# Patient Record
Sex: Male | Born: 1970 | Race: Black or African American | Hispanic: No | Marital: Single | Smoking: Former smoker
Health system: Southern US, Community
[De-identification: ages and names within clinical notes are randomized; demographics above are authoritative.]

## PROBLEM LIST (undated history)

## (undated) DIAGNOSIS — Z720 Tobacco use: Secondary | ICD-10-CM

## (undated) DIAGNOSIS — I429 Cardiomyopathy, unspecified: Secondary | ICD-10-CM

## (undated) DIAGNOSIS — N200 Calculus of kidney: Secondary | ICD-10-CM

## (undated) DIAGNOSIS — I729 Aneurysm of unspecified site: Secondary | ICD-10-CM

## (undated) DIAGNOSIS — I10 Essential (primary) hypertension: Secondary | ICD-10-CM

## (undated) DIAGNOSIS — J869 Pyothorax without fistula: Secondary | ICD-10-CM

## (undated) HISTORY — DX: Pyothorax without fistula: J86.9

## (undated) HISTORY — PX: ANEURYSM COILING: SHX5349

---

## 2007-12-23 ENCOUNTER — Emergency Department: Payer: Self-pay | Admitting: Emergency Medicine

## 2008-06-02 ENCOUNTER — Emergency Department: Payer: Self-pay | Admitting: Emergency Medicine

## 2008-06-02 ENCOUNTER — Other Ambulatory Visit: Payer: Self-pay

## 2008-08-15 ENCOUNTER — Emergency Department: Payer: Self-pay | Admitting: Emergency Medicine

## 2018-08-14 DIAGNOSIS — J869 Pyothorax without fistula: Secondary | ICD-10-CM

## 2018-08-14 HISTORY — DX: Pyothorax without fistula: J86.9

## 2018-08-16 ENCOUNTER — Emergency Department: Payer: Self-pay

## 2018-08-16 ENCOUNTER — Other Ambulatory Visit: Payer: Self-pay

## 2018-08-16 ENCOUNTER — Inpatient Hospital Stay
Admission: EM | Admit: 2018-08-16 | Discharge: 2018-08-19 | DRG: 193 | Disposition: A | Discharge status: Home / Self Care | Payer: MEDICAID | Attending: Internal Medicine | Admitting: Internal Medicine

## 2018-08-16 DIAGNOSIS — T380X5A Adverse effect of glucocorticoids and synthetic analogues, initial encounter: Secondary | ICD-10-CM | POA: Diagnosis present

## 2018-08-16 DIAGNOSIS — Z801 Family history of malignant neoplasm of trachea, bronchus and lung: Secondary | ICD-10-CM

## 2018-08-16 DIAGNOSIS — J189 Pneumonia, unspecified organism: Principal | ICD-10-CM | POA: Diagnosis present

## 2018-08-16 DIAGNOSIS — J918 Pleural effusion in other conditions classified elsewhere: Secondary | ICD-10-CM | POA: Diagnosis present

## 2018-08-16 DIAGNOSIS — Z716 Tobacco abuse counseling: Secondary | ICD-10-CM

## 2018-08-16 DIAGNOSIS — C7802 Secondary malignant neoplasm of left lung: Secondary | ICD-10-CM | POA: Diagnosis present

## 2018-08-16 DIAGNOSIS — J9601 Acute respiratory failure with hypoxia: Secondary | ICD-10-CM | POA: Diagnosis present

## 2018-08-16 DIAGNOSIS — I43 Cardiomyopathy in diseases classified elsewhere: Secondary | ICD-10-CM | POA: Diagnosis present

## 2018-08-16 DIAGNOSIS — F17210 Nicotine dependence, cigarettes, uncomplicated: Secondary | ICD-10-CM | POA: Diagnosis present

## 2018-08-16 DIAGNOSIS — Z808 Family history of malignant neoplasm of other organs or systems: Secondary | ICD-10-CM

## 2018-08-16 DIAGNOSIS — D7589 Other specified diseases of blood and blood-forming organs: Secondary | ICD-10-CM | POA: Diagnosis present

## 2018-08-16 DIAGNOSIS — J9811 Atelectasis: Secondary | ICD-10-CM | POA: Diagnosis present

## 2018-08-16 DIAGNOSIS — Z87442 Personal history of urinary calculi: Secondary | ICD-10-CM

## 2018-08-16 DIAGNOSIS — Z79899 Other long term (current) drug therapy: Secondary | ICD-10-CM

## 2018-08-16 DIAGNOSIS — Z806 Family history of leukemia: Secondary | ICD-10-CM

## 2018-08-16 DIAGNOSIS — X58XXXA Exposure to other specified factors, initial encounter: Secondary | ICD-10-CM | POA: Diagnosis present

## 2018-08-16 DIAGNOSIS — J410 Simple chronic bronchitis: Secondary | ICD-10-CM | POA: Diagnosis present

## 2018-08-16 DIAGNOSIS — R739 Hyperglycemia, unspecified: Secondary | ICD-10-CM | POA: Diagnosis not present

## 2018-08-16 DIAGNOSIS — J90 Pleural effusion, not elsewhere classified: Secondary | ICD-10-CM

## 2018-08-16 DIAGNOSIS — I10 Essential (primary) hypertension: Secondary | ICD-10-CM | POA: Diagnosis present

## 2018-08-16 DIAGNOSIS — R7303 Prediabetes: Secondary | ICD-10-CM | POA: Diagnosis present

## 2018-08-16 DIAGNOSIS — Z79891 Long term (current) use of opiate analgesic: Secondary | ICD-10-CM

## 2018-08-16 DIAGNOSIS — X500XXA Overexertion from strenuous movement or load, initial encounter: Secondary | ICD-10-CM

## 2018-08-16 HISTORY — DX: Aneurysm of unspecified site: I72.9

## 2018-08-16 HISTORY — DX: Essential (primary) hypertension: I10

## 2018-08-16 HISTORY — DX: Cardiomyopathy, unspecified: I42.9

## 2018-08-16 HISTORY — DX: Calculus of kidney: N20.0

## 2018-08-16 LAB — CBC WITH DIFFERENTIAL/PLATELET
BASOS ABS: 0 10*3/uL (ref 0–0.1)
Basophils Relative: 0 %
Eosinophils Absolute: 0.1 10*3/uL (ref 0–0.7)
Eosinophils Relative: 2 %
HEMATOCRIT: 39.5 % — AB (ref 40.0–52.0)
HEMOGLOBIN: 13.9 g/dL (ref 13.0–18.0)
LYMPHS PCT: 13 %
Lymphs Abs: 1.2 10*3/uL (ref 1.0–3.6)
MCH: 35.2 pg — ABNORMAL HIGH (ref 26.0–34.0)
MCHC: 35.2 g/dL (ref 32.0–36.0)
MCV: 100.1 fL — AB (ref 80.0–100.0)
MONO ABS: 0.7 10*3/uL (ref 0.2–1.0)
Monocytes Relative: 8 %
NEUTROS ABS: 7.6 10*3/uL — AB (ref 1.4–6.5)
NEUTROS PCT: 77 %
Platelets: 331 10*3/uL (ref 150–440)
RBC: 3.95 MIL/uL — AB (ref 4.40–5.90)
RDW: 13.3 % (ref 11.5–14.5)
WBC: 9.7 10*3/uL (ref 3.8–10.6)

## 2018-08-16 LAB — BASIC METABOLIC PANEL
ANION GAP: 7 (ref 5–15)
BUN: 16 mg/dL (ref 6–20)
CHLORIDE: 104 mmol/L (ref 98–111)
CO2: 29 mmol/L (ref 22–32)
Calcium: 9 mg/dL (ref 8.9–10.3)
Creatinine, Ser: 1.15 mg/dL (ref 0.61–1.24)
GFR calc Af Amer: >60 mL/min (ref >60)
GFR calc non Af Amer: >60 mL/min (ref >60)
GLUCOSE: 120 mg/dL — AB (ref 70–99)
POTASSIUM: 3.8 mmol/L (ref 3.5–5.1)
Sodium: 140 mmol/L (ref 135–145)

## 2018-08-16 LAB — LACTIC ACID, PLASMA: Lactic Acid, Venous: 1.9 mmol/L (ref 0.5–1.9)

## 2018-08-16 LAB — TROPONIN I

## 2018-08-16 MED ORDER — MORPHINE SULFATE (PF) 4 MG/ML IV SOLN
4.0000 mg | Freq: Once | INTRAVENOUS | Status: AC
  Administered 2018-08-16: 4 mg via INTRAVENOUS

## 2018-08-16 MED ORDER — ONDANSETRON HCL 4 MG/2ML IJ SOLN: 4.0000 mg | Freq: Once | INTRAMUSCULAR | Status: DC

## 2018-08-16 MED ORDER — GI COCKTAIL ~~LOC~~
30.0000 mL | Freq: Once | ORAL | Status: DC
  Filled 2018-08-16: qty 30

## 2018-08-16 MED ORDER — MORPHINE SULFATE (PF) 4 MG/ML IV SOLN
INTRAVENOUS | Status: AC
  Administered 2018-08-16: 4 mg via INTRAVENOUS
  Filled 2018-08-16: qty 1

## 2018-08-16 MED ORDER — ONDANSETRON HCL 4 MG/2ML IJ SOLN
4.0000 mg | Freq: Once | INTRAMUSCULAR | Status: AC
  Administered 2018-08-16: 4 mg via INTRAVENOUS

## 2018-08-16 MED ORDER — IOPAMIDOL (ISOVUE-370) INJECTION 76%
100.0000 mL | Freq: Once | INTRAVENOUS | Status: AC | PRN
  Administered 2018-08-16: 100 mL via INTRAVENOUS

## 2018-08-16 MED ORDER — MORPHINE SULFATE (PF) 4 MG/ML IV SOLN
4.0000 mg | INTRAVENOUS | Status: DC | PRN
  Administered 2018-08-16: 4 mg via INTRAVENOUS
  Filled 2018-08-16: qty 1

## 2018-08-16 MED ORDER — ONDANSETRON HCL 4 MG/2ML IJ SOLN
INTRAMUSCULAR | Status: AC
  Administered 2018-08-16: 4 mg via INTRAVENOUS
  Filled 2018-08-16: qty 2

## 2018-08-16 NOTE — ED Notes (Addendum)
Quentin Cornwall MD at bedside. Pts O2 sat ~92-93 on RA, pt placed on 2L and O2 sat 97%

## 2018-08-16 NOTE — ED Provider Notes (Signed)
[facility] Emergency Department Provider Note    First MD Initiated Contact with Patient 08/16/18 2148     (approximate)  I have reviewed the triage vital signs and the nursing notes.   HISTORY  Chief Complaint Shortness of Breath    HPI Roy [name] is a 47 y.o. male the ER with chief complaint of shortness of breath and difficulty taking a deep breath.  States he been having intermittent shortness of breath and chest pain for the past week and was lifting heavy boxes over a week ago but denies any specific injury.  States that the symptoms became acutely worse around 7:00 tonight he is having difficulty taking a deep breath.  States the pain is moderate to severe.  Denies any fever.  Denies any cough.  No nausea or vomiting.  Have a history of reported cardiomyopathy.  Denies any recent surgeries.  No history of blood clots.    Past Medical History:  Diagnosis Date  . Hypertension    No family history on file.  There are no active problems to display for this patient.     Prior to Admission medications   Not on File    Allergies Patient has no allergy information on record.    Social History Social History   Tobacco Use  . Smoking status: Not on file  Substance Use Topics  . Alcohol use: Not on file  . Drug use: Not on file    Review of Systems Patient denies headaches, rhinorrhea, blurry vision, numbness, shortness of breath, chest pain, edema, cough, abdominal pain, nausea, vomiting, diarrhea, dysuria, fevers, rashes or hallucinations unless otherwise stated above in HPI. ____________________________________________   PHYSICAL EXAM:  VITAL SIGNS: Vitals:   08/16/18 2300 08/17/18 0000  BP: (!) 162/77 (!) 146/71  Pulse: 60 69  Resp: (!) 27   Temp:    SpO2: 98% 96%    Constitutional: Alert and oriented.  In mild respiratory distress very tachypneic and splinting. Eyes: Conjunctivae are normal.  Head:  Atraumatic. Nose: No congestion/rhinnorhea. Mouth/Throat: Mucous membranes are moist.   Neck: No stridor. Painless ROM.  Cardiovascular: Normal rate, regular rhythm. Grossly normal heart sounds.  Good peripheral circulation. Respiratory: Tachypnea with shallow respirations.  Diminished breath sounds in the right lower lung fields.   Gastrointestinal: Soft and nontender. No distention. No abdominal bruits. No CVA tenderness. Genitourinary:  Musculoskeletal: No lower extremity tenderness nor edema.  No joint effusions. Neurologic:  Normal speech and language. No gross focal neurologic deficits are appreciated. No facial droop Skin:  Skin is warm, dry and intact. No rash noted. Psychiatric: Mood and affect are normal. Speech and behavior are normal.  ____________________________________________   LABS (all labs ordered are listed, but only abnormal results are displayed)  Results for orders placed or performed during the hospital encounter of 08/16/18 (from the past 24 hour(s))  Lactic acid, plasma     Status: None   Collection Time: 08/16/18 10:26 PM  Result Value Ref Range   Lactic Acid, Venous 1.9 0.5 - 1.9 mmol/L  CBC with Differential/Platelet     Status: Abnormal   Collection Time: 08/16/18 10:41 PM  Result Value Ref Range   WBC 9.7 3.8 - 10.6 K/uL   RBC 3.95 (L) 4.40 - 5.90 MIL/uL   Hemoglobin 13.9 13.0 - 18.0 g/dL   HCT 39.5 (L) 40.0 - 52.0 %   MCV 100.1 (H) 80.0 - 100.0 fL   MCH 35.2 (H) 26.0 - 34.0 pg  MCHC 35.2 32.0 - 36.0 g/dL   RDW 13.3 11.5 - 14.5 %   Platelets 331 150 - 440 K/uL   Neutrophils Relative % 77 %   Neutro Abs 7.6 (H) 1.4 - 6.5 K/uL   Lymphocytes Relative 13 %   Lymphs Abs 1.2 1.0 - 3.6 K/uL   Monocytes Relative 8 %   Monocytes Absolute 0.7 0.2 - 1.0 K/uL   Eosinophils Relative 2 %   Eosinophils Absolute 0.1 0 - 0.7 K/uL   Basophils Relative 0 %   Basophils Absolute 0.0 0 - 0.1 K/uL  Basic metabolic panel     Status: Abnormal   Collection Time:  08/16/18 10:41 PM  Result Value Ref Range   Sodium 140 135 - 145 mmol/L   Potassium 3.8 3.5 - 5.1 mmol/L   Chloride 104 98 - 111 mmol/L   CO2 29 22 - 32 mmol/L   Glucose, Bld 120 (H) 70 - 99 mg/dL   BUN 16 6 - 20 mg/dL   Creatinine, Ser 1.15 0.61 - 1.24 mg/dL   Calcium 9.0 8.9 - 10.3 mg/dL   GFR calc non Af Amer >60 >60 mL/min   GFR calc Af Amer >60 >60 mL/min   Anion gap 7 5 - 15  Troponin I     Status: None   Collection Time: 08/16/18 10:41 PM  Result Value Ref Range   Troponin I <0.03 <0.03 ng/mL   ____________________________________________  EKG My review and personal interpretation at Time: 21:46   Indication: cp  Rate: 55  Rhythm: sinus Axis: normal Other: inferolateral t wave inversions,  Lateral inversions new as compared to last ekg in 2009 ____________________________________________  RADIOLOGY  I personally reviewed all radiographic images ordered to evaluate for the above acute complaints and reviewed radiology reports and findings.  These findings were personally discussed with the patient.  Please see medical record for radiology report.  ____________________________________________   PROCEDURES  Procedure(s) performed:  .Critical Care Performed by: Merlyn Lot, MD Authorized by: Merlyn Lot, MD   Critical care provider statement:    Critical care time (minutes):  40   Critical care time was exclusive of:  Separately billable procedures and treating other patients   Critical care was necessary to treat or prevent imminent or life-threatening deterioration of the following conditions:  Respiratory failure   Critical care was time spent personally by me on the following activities:  Development of treatment plan with patient or surrogate, discussions with consultants, evaluation of patient's response to treatment, examination of patient, obtaining history from patient or surrogate, ordering and performing treatments and interventions, ordering and  review of laboratory studies, ordering and review of radiographic studies, pulse oximetry, re-evaluation of patient's condition and review of old charts      Critical Care performed: yes ____________________________________________   INITIAL IMPRESSION / [place] / ED COURSE  Pertinent labs & imaging results that were available during my care of the patient were reviewed by me and considered in my medical decision making (see chart for details).   DDX: Asthma, copd, CHF, pna, ptx, malignancy, Pe, anemia   Roy [name] is a 47 y.o. who presents to the ED with respiratory distress as described above.  Patient placed on supplemental oxygen with some improvement.  X-ray shows no evidence of pneumothorax.  Based on presentation will order ct to further eval and rule out pe.  Clinical Course as of Aug 18 27  Wed Aug 17, 2018  0022 Patient reassessed.  Still protecting his airway but does appear ill.  CT imaging shows no evidence of PE but certainly does show evidence of probable multiple focal pneumonia.  Will be started on broad-spectrum antibiotics and given the for acuity of his presentation will also give MRSA coverage with vancomycin.  Have discussed with the patient and available family all diagnostics and treatments performed thus far and all questions were answered to the best of my ability. The patient demonstrates understanding and agreement with plan.    [PR]    Clinical Course User Index [PR] Merlyn Lot, MD     As part of my medical decision making, I reviewed the following data within the [place] notes reviewed and incorporated, Labs reviewed, notes from prior ED visits.   ____________________________________________   FINAL CLINICAL IMPRESSION(S) / ED DIAGNOSES  Final diagnoses:  Acute respiratory failure with hypoxia ([place])      NEW MEDICATIONS STARTED DURING THIS VISIT:  New Prescriptions   No medications on  file     Note:  This document was prepared using Dragon voice recognition software and may include unintentional dictation errors.    Merlyn Lot, MD 08/17/18 Shelah Lewandowsky

## 2018-08-16 NOTE — ED Triage Notes (Signed)
Pt in with co shob and cp since last week while lifting boxes but today at 1900 became severe. States pain to right chest worse when takes a deep breath.

## 2018-08-16 NOTE — ED Notes (Signed)
Patient transported to CT 

## 2018-08-17 ENCOUNTER — Encounter: Payer: Self-pay | Admitting: Internal Medicine

## 2018-08-17 ENCOUNTER — Inpatient Hospital Stay: Payer: Self-pay

## 2018-08-17 ENCOUNTER — Inpatient Hospital Stay
Admit: 2018-08-17 | Discharge: 2018-08-17 | Disposition: A | Discharge status: Home / Self Care | Payer: Self-pay | Attending: Internal Medicine | Admitting: Internal Medicine

## 2018-08-17 DIAGNOSIS — I429 Cardiomyopathy, unspecified: Secondary | ICD-10-CM

## 2018-08-17 DIAGNOSIS — J181 Lobar pneumonia, unspecified organism: Secondary | ICD-10-CM

## 2018-08-17 DIAGNOSIS — J90 Pleural effusion, not elsewhere classified: Secondary | ICD-10-CM

## 2018-08-17 DIAGNOSIS — R0781 Pleurodynia: Secondary | ICD-10-CM

## 2018-08-17 LAB — URINALYSIS, COMPLETE (UACMP) WITH MICROSCOPIC
BACTERIA UA: NONE SEEN
Bilirubin Urine: NEGATIVE
Glucose, UA: >=500 mg/dL — AB
Ketones, ur: NEGATIVE mg/dL
LEUKOCYTES UA: NEGATIVE
Nitrite: NEGATIVE
PROTEIN: NEGATIVE mg/dL
SQUAMOUS EPITHELIAL / LPF: NONE SEEN (ref 0–5)
Specific Gravity, Urine: 1.016 (ref 1.005–1.030)
pH: 5 (ref 5.0–8.0)

## 2018-08-17 LAB — TROPONIN I: Troponin I: <0.03 ng/mL (ref <0.03)

## 2018-08-17 LAB — RESPIRATORY PANEL BY PCR
ADENOVIRUS-RVPPCR: NOT DETECTED
Bordetella pertussis: NOT DETECTED
CORONAVIRUS 229E-RVPPCR: NOT DETECTED
CORONAVIRUS NL63-RVPPCR: NOT DETECTED
CORONAVIRUS OC43-RVPPCR: NOT DETECTED
Chlamydophila pneumoniae: NOT DETECTED
Coronavirus HKU1: NOT DETECTED
INFLUENZA A-RVPPCR: NOT DETECTED
Influenza B: NOT DETECTED
METAPNEUMOVIRUS-RVPPCR: NOT DETECTED
MYCOPLASMA PNEUMONIAE-RVPPCR: NOT DETECTED
Parainfluenza Virus 1: NOT DETECTED
Parainfluenza Virus 2: NOT DETECTED
Parainfluenza Virus 3: NOT DETECTED
Parainfluenza Virus 4: NOT DETECTED
Respiratory Syncytial Virus: NOT DETECTED
Rhinovirus / Enterovirus: NOT DETECTED

## 2018-08-17 LAB — STREP PNEUMONIAE URINARY ANTIGEN: STREP PNEUMO URINARY ANTIGEN: NEGATIVE

## 2018-08-17 LAB — URINE DRUG SCREEN, QUALITATIVE (ARMC ONLY)
AMPHETAMINES, UR SCREEN: NOT DETECTED
Barbiturates, Ur Screen: NOT DETECTED
CANNABINOID 50 NG, UR ~~LOC~~: POSITIVE — AB
COCAINE METABOLITE, UR ~~LOC~~: NOT DETECTED
MDMA (ECSTASY) UR SCREEN: NOT DETECTED
METHADONE SCREEN, URINE: NOT DETECTED
Opiate, Ur Screen: POSITIVE — AB
Phencyclidine (PCP) Ur S: NOT DETECTED
TRICYCLIC, UR SCREEN: NOT DETECTED

## 2018-08-17 LAB — HEPATIC FUNCTION PANEL
ALK PHOS: 55 U/L (ref 38–126)
ALT: 30 U/L (ref 0–44)
AST: 25 U/L (ref 15–41)
Albumin: 3.4 g/dL — ABNORMAL LOW (ref 3.5–5.0)
BILIRUBIN TOTAL: 0.8 mg/dL (ref 0.3–1.2)
Bilirubin, Direct: <0.1 mg/dL (ref 0.0–0.2)
Total Protein: 7.2 g/dL (ref 6.5–8.1)

## 2018-08-17 LAB — PROCALCITONIN: PROCALCITONIN: 1.14 ng/mL

## 2018-08-17 LAB — FOLATE: FOLATE: 6.4 ng/mL (ref >5.9)

## 2018-08-17 LAB — SEDIMENTATION RATE: Sed Rate: 52 mm/hr — ABNORMAL HIGH (ref 0–15)

## 2018-08-17 LAB — MAGNESIUM: MAGNESIUM: 2 mg/dL (ref 1.7–2.4)

## 2018-08-17 LAB — C-REACTIVE PROTEIN: CRP: 8.7 mg/dL — ABNORMAL HIGH (ref <1.0)

## 2018-08-17 LAB — MRSA PCR SCREENING: MRSA by PCR: NEGATIVE

## 2018-08-17 LAB — PHOSPHORUS: PHOSPHORUS: 2.4 mg/dL — AB (ref 2.5–4.6)

## 2018-08-17 LAB — VITAMIN B12: Vitamin B-12: 209 pg/mL (ref 180–914)

## 2018-08-17 LAB — CK: Total CK: 109 U/L (ref 49–397)

## 2018-08-17 MED ORDER — METHYLPREDNISOLONE SODIUM SUCC 125 MG IJ SOLR
60.0000 mg | Freq: Two times a day (BID) | INTRAMUSCULAR | Status: AC
  Administered 2018-08-17 (×2): 60 mg via INTRAVENOUS
  Filled 2018-08-17 (×2): qty 2

## 2018-08-17 MED ORDER — ALBUTEROL SULFATE (2.5 MG/3ML) 0.083% IN NEBU
2.5000 mg | INHALATION_SOLUTION | Freq: Once | RESPIRATORY_TRACT | Status: AC
  Administered 2018-08-17: 2.5 mg via RESPIRATORY_TRACT
  Filled 2018-08-17: qty 3

## 2018-08-17 MED ORDER — TRAZODONE HCL 50 MG PO TABS
50.0000 mg | ORAL_TABLET | Freq: Every evening | ORAL | Status: DC | PRN
  Administered 2018-08-17: 50 mg via ORAL
  Filled 2018-08-17: qty 1

## 2018-08-17 MED ORDER — VANCOMYCIN HCL IN DEXTROSE 1-5 GM/200ML-% IV SOLN
1000.0000 mg | Freq: Once | INTRAVENOUS | Status: AC
  Administered 2018-08-17: 1000 mg via INTRAVENOUS
  Filled 2018-08-17: qty 200

## 2018-08-17 MED ORDER — HYDROMORPHONE HCL 1 MG/ML IJ SOLN
1.0000 mg | INTRAMUSCULAR | Status: DC | PRN
  Administered 2018-08-17 (×3): 2 mg via INTRAVENOUS
  Filled 2018-08-17 (×3): qty 2

## 2018-08-17 MED ORDER — PREDNISONE 20 MG PO TABS
40.0000 mg | ORAL_TABLET | Freq: Every day | ORAL | Status: DC
  Administered 2018-08-18 – 2018-08-19 (×2): 40 mg via ORAL
  Filled 2018-08-17 (×2): qty 2

## 2018-08-17 MED ORDER — ACETAMINOPHEN 325 MG PO TABS: 650.0000 mg | ORAL_TABLET | ORAL | Status: DC | PRN

## 2018-08-17 MED ORDER — SODIUM CHLORIDE 0.9 % IV SOLN
1.0000 g | Freq: Three times a day (TID) | INTRAVENOUS | Status: DC
  Administered 2018-08-17 (×2): 1 g via INTRAVENOUS
  Filled 2018-08-17 (×4): qty 1

## 2018-08-17 MED ORDER — SODIUM CHLORIDE 0.9 % IV SOLN
1.0000 g | INTRAVENOUS | Status: DC
  Administered 2018-08-17: 1 g via INTRAVENOUS
  Filled 2018-08-17: qty 10
  Filled 2018-08-17: qty 1

## 2018-08-17 MED ORDER — POTASSIUM PHOSPHATES 15 MMOLE/5ML IV SOLN
20.0000 mmol | Freq: Once | INTRAVENOUS | Status: AC
  Administered 2018-08-17: 20 mmol via INTRAVENOUS
  Filled 2018-08-17: qty 6.67

## 2018-08-17 MED ORDER — ASPIRIN 81 MG PO CHEW
81.0000 mg | CHEWABLE_TABLET | Freq: Every day | ORAL | Status: DC
  Administered 2018-08-17 – 2018-08-19 (×3): 81 mg via ORAL
  Filled 2018-08-17 (×3): qty 1

## 2018-08-17 MED ORDER — ONDANSETRON HCL 4 MG/2ML IJ SOLN: 4.0000 mg | Freq: Four times a day (QID) | INTRAMUSCULAR | Status: DC | PRN

## 2018-08-17 MED ORDER — FENTANYL CITRATE (PF) 100 MCG/2ML IJ SOLN
50.0000 ug | INTRAMUSCULAR | Status: DC | PRN
  Administered 2018-08-17: 50 ug via INTRAVENOUS
  Filled 2018-08-17: qty 2

## 2018-08-17 MED ORDER — SODIUM CHLORIDE 0.9 % IV SOLN
2.0000 g | INTRAVENOUS | Status: DC
  Filled 2018-08-17: qty 20

## 2018-08-17 MED ORDER — VANCOMYCIN HCL 10 G IV SOLR
1250.0000 mg | Freq: Three times a day (TID) | INTRAVENOUS | Status: DC
  Administered 2018-08-17: 1250 mg via INTRAVENOUS
  Filled 2018-08-17 (×4): qty 1250

## 2018-08-17 MED ORDER — FENTANYL CITRATE (PF) 100 MCG/2ML IJ SOLN: 50.0000 ug | INTRAMUSCULAR | Status: DC | PRN

## 2018-08-17 MED ORDER — BISACODYL 5 MG PO TBEC: 5.0000 mg | DELAYED_RELEASE_TABLET | Freq: Every day | ORAL | Status: DC | PRN

## 2018-08-17 MED ORDER — SODIUM CHLORIDE 0.9 % IV BOLUS
1000.0000 mL | Freq: Once | INTRAVENOUS | Status: AC
  Administered 2018-08-17: 1000 mL via INTRAVENOUS

## 2018-08-17 MED ORDER — SODIUM CHLORIDE 0.9 % IV SOLN
500.0000 mg | INTRAVENOUS | Status: DC
  Administered 2018-08-17 – 2018-08-18 (×2): 500 mg via INTRAVENOUS
  Filled 2018-08-17 (×3): qty 500

## 2018-08-17 MED ORDER — HYDROMORPHONE HCL 1 MG/ML IJ SOLN
1.0000 mg | INTRAMUSCULAR | Status: DC | PRN
  Administered 2018-08-17 – 2018-08-18 (×3): 1 mg via INTRAVENOUS
  Filled 2018-08-17 (×3): qty 1

## 2018-08-17 MED ORDER — GUAIFENESIN 100 MG/5ML PO SOLN
5.0000 mL | ORAL | Status: DC | PRN
  Administered 2018-08-18 – 2018-08-19 (×5): 100 mg via ORAL
  Filled 2018-08-17 (×6): qty 5

## 2018-08-17 MED ORDER — SENNOSIDES-DOCUSATE SODIUM 8.6-50 MG PO TABS: 1.0000 | ORAL_TABLET | Freq: Every evening | ORAL | Status: DC | PRN

## 2018-08-17 MED ORDER — METHYLPREDNISOLONE SODIUM SUCC 125 MG IJ SOLR
125.0000 mg | Freq: Once | INTRAMUSCULAR | Status: AC
  Administered 2018-08-17: 125 mg via INTRAVENOUS
  Filled 2018-08-17: qty 2

## 2018-08-17 MED ORDER — ENOXAPARIN SODIUM 40 MG/0.4ML ~~LOC~~ SOLN
40.0000 mg | SUBCUTANEOUS | Status: DC
  Administered 2018-08-17 – 2018-08-18 (×2): 40 mg via SUBCUTANEOUS
  Filled 2018-08-17 (×2): qty 0.4

## 2018-08-17 MED ORDER — IPRATROPIUM-ALBUTEROL 0.5-2.5 (3) MG/3ML IN SOLN
3.0000 mL | Freq: Four times a day (QID) | RESPIRATORY_TRACT | Status: DC
  Administered 2018-08-18 – 2018-08-19 (×5): 3 mL via RESPIRATORY_TRACT
  Filled 2018-08-17 (×7): qty 3

## 2018-08-17 MED ORDER — IPRATROPIUM-ALBUTEROL 0.5-2.5 (3) MG/3ML IN SOLN
3.0000 mL | RESPIRATORY_TRACT | Status: DC
  Administered 2018-08-17 (×5): 3 mL via RESPIRATORY_TRACT
  Filled 2018-08-17 (×5): qty 3

## 2018-08-17 NOTE — ED Notes (Signed)
Hospitalist to bedside at this time 

## 2018-08-17 NOTE — H&P (Addendum)
[facility] [place] NAME: Roy [name]    MR#:  [id number]  DATE OF BIRTH:  1971/04/10  DATE OF ADMISSION:  08/16/2018  PRIMARY CARE PHYSICIAN: Patient, No Pcp Per   REQUESTING/REFERRING PHYSICIAN: Merlyn Lot, MD  CHIEF COMPLAINT:   Chief Complaint  Patient presents with  . Shortness of Breath    HISTORY OF PRESENT ILLNESS:  Roy [name]  is a 47 y.o. male with a known history of HTN, nephrolithiasis, Hx cardiomyopathy (unknown type, pt states issue resolved), Hx cerebral aneurysm (s/p coiling), recent dental infxn (on ABx x1.5wks), who p/w R-sided CP + SOB. This case is highly interesting. History is obtained from pt and his significant other at bedside, and is as follows.  [name] tells me that sometime late last week, he developed pain on the R side of his upper abdomen and torso. 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 He attributed the pain to a pulled muscle from the lifting. He states the pain was sharp, intermittent, and mild to moderate in severity. It improved spontaneously with time. On Sunday (08/14/2018), pt states that the pain was recurrent, and began to migrate under the R breast. Pain was still coming and going, sharp, but more severe. He states it again resolved spontaneously with time, however, and he did not make mention of this to his significant other. Last night (08/16/2018 PM), pt states that he grilled dinner, ate, and then went to lie down to watch television prior to going to sleep. When he lied down, he states he developed R-sided/RUQ AP radiating up under the R nipple. The pain was constant (as opposed to previously intermittent), sharp and very severe. He had concomitant SOB. He states he tried to get up, but was unable to, 2/2 excruciating pain. He endorses respiratory distress, conversational dyspnea and decreased exercise tolerance.  The pain was pleuritic and positional, and he states any movement would exacerbate the pain. He was unable to find a position in which he was comfortable. His significant other drove him to the hospital. He endorses chills and rigors yesterday/today, but denies fever, diaphoresis or night sweats. He endorses a chronic "smoker's cough", which he states has been worse x2d, minimally productive of yellow sputum, (-) hemoptysis. He is a smoker, 1ppd x20-25+yrs. He does not have a PCP.  As noted above, pt works in a warehouse for signs. He states his father had a Hx of lung and throat Ca, and his cousin had leukemia/lymphoma. He is not aware of any rheumatological illness in the family. He denies recent travel or outdoors activities. He denies sick contacts. He denies chemical exposures. He does not have pets. He denies exposures to exotic animals, birds or livestock. He has not worked in Tourist information centre manager, Regulatory affairs officer. No known asbestos exposure. 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  Pt states that he vapes. He uses prefilled 510-thread THC oil cartridges. The brand of cartridge he is using is "Dank". He states his last use was yesterday (09/03), when he took one puff. Though this may not seem significant, it most assuredly is. I have just recently read an article, published on 08/13/2018, which I am unable to link directly due to the [facility] Internet connection being heavily policed for "inappropriate" content, which flags the article as drug-related. At any rate, the article cites a recent case of a tainted  510-thread THC cartridge from "Dank Vapes" brand causing severe respiratory distress (necessitating hospitalization) in a [place] resident. "Dank Vapes" is not a brand traceable to a single company, meaning it is not Land. In other words, anyone can order the packaging and fill it with ingredients of their choosing. "Como",  another such unregulated brand, has been implicated in causing similar cases in [place].  Standard disposable vaporizer cartridges have historically used propylene glycol, vegetable glycerin or medium-chain triglyceride (MCT) oil as additives for some years now. These substances have themselves given the FDA cause for concern, with propylene glycol recently being added as a respiratory toxicant to the FDA list of "Harmfully and Potentially Harmful Constituents in Tobacco Products." In the past, lab testing of unregulated cartridges has turned up traces of pesticides, solvents, heavy metals and synthetic cannabinoids. Petroleum-based and mineral oil-based diluents have been used in unregulated cartridges. More recently, however, industry insiders have noted a novel class of odorless, tasteless thickening agents, some of which are proprietary formulations, once again unregulated. There has been misuse of thickening agents not meant for vaporization, such as True Terpenes diluent Viscosity, or Florapex Terpenes diluent Uber Thick. There are additional reports of an underground diluent thickener called Honey Cut, for which the manufacturer is unknown, as is the chemical makeup of the substance. There is suspicion that Honey Cut may contain tocopherol-acetate, inhalation of which may cause respiratory illness severe enough to result in hospitalization.  At the time of my assessment, the pt is in moderate to severe distress 2/2 pain + SOB. He does not appear septic/toxic. His vitals are stable, and he maintains a stable SpO2 on [place] O2.  PAST MEDICAL HISTORY:   Past Medical History:  Diagnosis Date  . Hypertension    PAST SURGICAL HISTORY:  History reviewed. No pertinent surgical history.  SOCIAL HISTORY:   Social History   Tobacco Use  . Smoking status: Not on file  Substance Use Topics  . Alcohol use: Not on file   FAMILY HISTORY:  History reviewed. No pertinent family history.  DRUG  ALLERGIES:  No Known Allergies  REVIEW OF SYSTEMS:   Review of Systems  Constitutional: Positive for chills and malaise/fatigue. Negative for diaphoresis, fever and weight loss.  HENT: Negative for congestion, ear pain, hearing loss, nosebleeds, sinus pain, sore throat and tinnitus.   Eyes: Negative for blurred vision, double vision and photophobia.  Respiratory: Positive for cough, sputum production and shortness of breath. Negative for hemoptysis and wheezing.   Cardiovascular: Positive for chest pain. Negative for palpitations, orthopnea, claudication, leg swelling and PND.  Gastrointestinal: Positive for abdominal pain. Negative for blood in stool, constipation, diarrhea, heartburn, melena, nausea and vomiting.  Genitourinary: Negative for dysuria, frequency, hematuria and urgency.  Musculoskeletal: Negative for back pain, joint pain, myalgias and neck pain.  Skin: Negative for itching and rash.  Neurological: Positive for weakness. Negative for dizziness, tingling, tremors, sensory change, speech change, focal weakness, seizures, loss of consciousness and headaches.  Psychiatric/Behavioral: Negative for memory loss. The patient does not have insomnia.    MEDICATIONS AT HOME:   Prior to Admission medications   Medication Sig Start Date End Date Taking? Authorizing Provider  chlorhexidine (PERIDEX) 0.12 % solution SWISH AND SPIT 15 ML IN MOUTH TWICE DAILY FOR 14 DAYS 08/05/18  Yes [provider]  HYDROcodone-acetaminophen (NORCO/VICODIN) 5-325 MG tablet TAKE 1 TABLET BY MOUTH EVERY 6 HOURS AS NEEDED FOR PAIN FOR UP TO 5 DAYS 08/05/18  Yes [provider]  penicillin v potassium (VEETID) 500 MG tablet TAKE 1 TABLET BY MOUTH 4 TIMES DAILY FOR 10 DAYS 08/05/18  Yes [provider]      VITAL SIGNS:  Blood pressure (!) 156/86, pulse 76, temperature 98.2 F (36.8 C), temperature source Oral, resp. rate (!) 27, height '6\' 3"'$  (1.905 m), weight 111.1 kg, SpO2 93  %.  PHYSICAL EXAMINATION:  Physical Exam  Constitutional: He is oriented to person, place, and time. He appears well-developed and well-nourished. He is active and cooperative.  Non-toxic appearance. He appears ill. He appears distressed. He is not intubated. Nasal cannula in place.  HENT:  Head: Normocephalic and atraumatic.  Mouth/Throat: Oropharynx is clear and moist. No oropharyngeal exudate or posterior oropharyngeal edema.  Eyes: Conjunctivae, EOM and lids are normal. No scleral icterus.  Neck: Neck supple. No JVD present. No thyromegaly present.  Cardiovascular: Normal rate, regular rhythm, S1 normal, S2 normal and normal heart sounds.  No extrasystoles are present. Exam reveals no gallop, no S3, no S4, no distant heart sounds and no friction rub.  No murmur heard. Pulmonary/Chest: No accessory muscle usage or stridor. Tachypnea noted. No apnea and no bradypnea. He is not intubated. He is in respiratory distress. He has decreased breath sounds in the right upper field, the right middle field and the right lower field. He has wheezes in the left middle field and the left lower field. He has no rhonchi. He has rales in the right lower field.  (+) conversational dysnpea. (+) splinting.  Abdominal: Soft. Bowel sounds are normal. He exhibits no distension. There is no tenderness. There is no rigidity, no rebound and no guarding.  Musculoskeletal: Normal range of motion. He exhibits no edema or tenderness.       Right lower leg: Normal. He exhibits no tenderness and no edema.       Left lower leg: Normal. He exhibits no tenderness and no edema.  Lymphadenopathy:    He has no cervical adenopathy.  Neurological: He is alert and oriented to person, place, and time. He is not disoriented.  Skin: Skin is warm, dry and intact. No rash noted. He is not diaphoretic. No erythema.  Psychiatric: He has a normal mood and affect. His speech is normal and behavior is normal. Judgment and thought content  normal. His mood appears not anxious. He is not agitated. Cognition and memory are normal.   LABORATORY PANEL:   CBC Recent Labs  Lab 08/16/18 2241  WBC 9.7  HGB 13.9  HCT 39.5*  PLT 331   ------------------------------------------------------------------------------------------------------------------  Chemistries  Recent Labs  Lab 08/16/18 2241  NA 140  K 3.8  CL 104  CO2 29  GLUCOSE 120*  BUN 16  CREATININE 1.15  CALCIUM 9.0   ------------------------------------------------------------------------------------------------------------------  Cardiac Enzymes Recent Labs  Lab 08/16/18 2241  TROPONINI <0.03   ------------------------------------------------------------------------------------------------------------------  RADIOLOGY:  Ct Angio Chest Pe W And/or Wo Contrast  Result Date: 08/16/2018 CLINICAL DATA:  Shortness of breath and chest pain since last week while lifting boxes. More severe today. Right chest pain worse with deep breathing. EXAM: CT ANGIOGRAPHY CHEST WITH CONTRAST TECHNIQUE: Multidetector CT imaging of the chest was performed using the standard protocol during bolus administration of intravenous contrast. Multiplanar CT image reconstructions and MIPs were obtained to evaluate the vascular anatomy. CONTRAST:  133m ISOVUE-370 IOPAMIDOL (ISOVUE-370) INJECTION 76% COMPARISON:  None. FINDINGS: Cardiovascular: Suboptimal contrast bolus limits the examination. No main or lobar pulmonary artery emboli are demonstrated but segmental or below levels are  not well evaluated. Normal heart size. No pericardial effusion. Normal caliber thoracic aorta. Great vessel origins are patent. Mediastinum/Nodes: Right subcarinal lymph node measures up to about 12 mm short axis dimension. No other prominent lymph nodes. Likely reactive. Esophagus is decompressed. Lungs/Pleura: Small right pleural effusion. Bilateral basilar atelectasis or consolidation. Patchy nodular opacities  most prominent in the left lung with several discrete nodules identified. Largest is in the left lung base and measures 2 cm diameter. Differential diagnosis would include multifocal pneumonia or metastasis. Short-term follow-up imaging is recommended to exclude metastatic disease. No pneumothorax. Airways are patent. Mild emphysematous changes in the apices. Upper Abdomen: No acute abnormality is identified. Musculoskeletal: Degenerative changes in the spine. No destructive bone lesions. Review of the MIP images confirms the above findings. IMPRESSION: 1. No evidence of significant pulmonary embolus, although poor bolus limits evaluation of segmental and distal pulmonary arteries. 2. Small right pleural effusion with bilateral basilar atelectasis or consolidation. 3. Patchy nodular opacities most prominent in the left lung with several discrete nodules identified in the left lung base. Differential diagnosis would include multifocal pneumonia or metastasis. Short-term follow-up imaging is recommended to exclude metastatic disease. 4. Mild emphysematous changes in the apices. 5. Borderline enlarged right subcarinal lymph node, likely reactive. Emphysema (ICD10-J43.9). Electronically Signed   By: Lucienne Capers M.D.   On: 08/16/2018 23:48   Dg Chest Portable 1 View  Result Date: 08/16/2018 CLINICAL DATA:  Chest pain and short of breath EXAM: PORTABLE CHEST 1 VIEW COMPARISON:  06/03/2008 FINDINGS: Small right greater than left pleural effusions. Cardiomegaly with vascular congestion. Consolidation at the right middle lobe and right base. No pneumothorax. IMPRESSION: 1. Small right greater than left pleural effusion with consolidation at the right middle lobe and right base. 2. Cardiomegaly with vascular congestion Electronically Signed   By: Donavan Foil M.D.   On: 08/16/2018 22:41   IMPRESSION AND PLAN:   A/P: 43M p/w R-sided CP + SOB/acute hypoxemic respiratory failure. CT chest (-) PE, (+) "small right  pleural effusion" + "bilateral basilar atelectasis or consolidation" + "patchy nodular opacities most prominent in the left lung with several discrete nodules identified in the left lung base." Hyperglycemia, macrocytosis (w/o anemia). -R CP, SOB/acute hypoxemic respiratory failure, pleural effusion, abnormal CT chest findings: Pt p/w CP + SOB, as per HPI. SpO2 mildly decreased on arrival to ED, stable on [place] O2. Exam (+) tachypnea, respiratory distress, conversational dyspnea, diminished breath sounds R lung, R basilar fine crackles, and mild L basilar end-expiratory wheezing. Pt does not exhibit tripoding, retractions or accessory muscle use. He is in significant R-sided pain, pleuritic and positional in character. I have reviewed the pt's CT chest. He has a small right pleural effusion, and what appears to be a RLL consolidation, with some variable radiodensity. There are also several nodular opacifications in the L lung. The report notes that the imaging may be consistent w/ multifocal pneumonia vs. metastasis; however, I am concerned for lipoid pneumonia and/or an inflammatory/vasculitic process, potentially induced by unknown inhalational agents, discussed at length in the HPI. Tachypneic but SIRS (-), (-) tachycardia, afebrile, (-) leukocytosis, does not appear toxic. Tele, continuous cardiac monitoring, pulse ox, supplemental O2. Trop-I (-) x1, rpt pending. EKG (-) ST elevation. Echo pending. [id number]. Low suspicion for cardiac etiology of CP. DuoNebs, steroids (IV, transition to PO). Broad-spectrum ABx (Vanc + Meropenem + Azithromycin). Lactate 1.9. PCT, BCx, U/A, UCx, Sputum Cx/gram, UStrep + ULegionella Ag, respiratory viral panel pending. Rheumatological/vasculitic/inflammatory lab panel (including ESR, CRP,  CPK, ANCA, ANA, RF, complement, anti-GBM, cryoglobulin, HBV/HCV/HIV) pending. Ordered for U/S R chest + IR thoracentesis (w/ labwork). Pulmonology consult, may need bronch/BAL/Bx vs. needle vs. wedge.  May also benefit from higher resolution imaging. Incentive spirometry, pulmonary toileting. NPO for now. -Hx cardiomyopathy: Hx unclear, pt states issue resolved. Will obtain Echo and trend Trop-I as above. Tele, continuous cardiac monitoring. -Macrocytosis w/o anemia: Hgb 13.9, MCV 100.1. B12, Folate levels. -FEN/GI: NPO. -DVT PPx: Lovenox. -Code status: Full code. -Disposition: Admission, > 2 midnights.  -If this is indeed determined to be a case of lipoid pneumonia 2/2 vape cart use, it would likely be prudent to get the CDC and the Department of Public Health involved. -I thank Dr. Quentin Cornwall (ED) for the opportunity to be involved with this interesting case.   All the records are reviewed and case discussed with ED provider. Management plans discussed with the patient, family and they are in agreement.  CODE STATUS: Full code.  TOTAL TIME TAKING CARE OF THIS PATIENT: 110 minutes.    Arta Silence M.D on 08/17/2018 at 1:45 AM  Between 7am to 6pm - Pager - [phone]  After 6pm go to [email or web address] - Proofreader  [facility]  Office  [phone]  CC: Primary care physician; Patient, No Pcp Per   Note: This dictation was prepared with Dragon dictation along with smaller phrase technology. Any transcriptional errors that result from this process are unintentional.

## 2018-08-17 NOTE — Consult Note (Signed)
Roy [name] is a 47 y.o. male  [id number]  Primary Cardiologist: New to Dr. Neoma Laming Reason for Consultation: Chest pain and shortness of breath  HPI: 47yo male with a past medical history of HTN, aneurysm, and cardiomyopathy presented to ER last night with shortness of breath and moderate-severe chest pain. Chest CT ruled out significant PE but  showed evidence of probable multifocal pneumonia and is being treated with broad spectrum antibiotics. Troponin negative x3.   Review of Systems: Pt reports shortness of breath and chest pain with inspiration is much better than yesterday.   Past Medical History:  Diagnosis Date  . Aneurysm ([place])   . Cardiomyopathy ([place])    pt sts he no longer has it  . Hypertension   . Renal calculi     Medications Prior to Admission  Medication Sig Dispense Refill  . chlorhexidine (PERIDEX) 0.12 % solution SWISH AND SPIT 15 ML IN MOUTH TWICE DAILY FOR 14 DAYS  0  . HYDROcodone-acetaminophen (NORCO/VICODIN) 5-325 MG tablet TAKE 1 TABLET BY MOUTH EVERY 6 HOURS AS NEEDED FOR PAIN FOR UP TO 5 DAYS  0  . penicillin v potassium (VEETID) 500 MG tablet TAKE 1 TABLET BY MOUTH 4 TIMES DAILY FOR 10 DAYS  0     . aspirin  81 mg Oral Daily  . enoxaparin (LOVENOX) injection  40 mg Subcutaneous Q24H  . ipratropium-albuterol  3 mL Nebulization Q4H  . methylPREDNISolone (SOLU-MEDROL) injection  60 mg Intravenous Q12H   Followed by  . [START ON 08/18/2018] predniSONE  40 mg Oral Q breakfast    Infusions: . azithromycin Stopped (08/17/18 0230)  . meropenem (MERREM) IV Stopped (08/17/18 0308)  . potassium PHOSPHATE IVPB (in mmol) 20 mmol (08/17/18 8938)  . vancomycin      No Known Allergies  Social History   Socioeconomic History  . Marital status: Single    Spouse name: Not on file  . Number of children: Not on file  . Years of education: Not on file  . Highest education level: Not on file  Occupational History  . Not on file  Social Needs   . Financial resource strain: Not on file  . Food insecurity:    Worry: Not on file    Inability: Not on file  . Transportation needs:    Medical: Not on file    Non-medical: Not on file  Tobacco Use  . Smoking status: Current Every Day Smoker    Types: Cigarettes  . Smokeless tobacco: Never Used  Substance and Sexual Activity  . Alcohol use: Not on file  . Drug use: Not on file  . Sexual activity: Not on file  Lifestyle  . Physical activity:    Days per week: Not on file    Minutes per session: Not on file  . Stress: Not on file  Relationships  . Social connections:    Talks on phone: Not on file    Gets together: Not on file    Attends religious service: Not on file    Active member of club or organization: Not on file    Attends meetings of clubs or organizations: Not on file    Relationship status: Not on file  . Intimate partner violence:    Fear of current or ex partner: Not on file    Emotionally abused: Not on file    Physically abused: Not on file    Forced sexual activity: Not on file  Other Topics Concern  .  Not on file  Social History Narrative  . Not on file    History reviewed. No pertinent family history.  PHYSICAL EXAM: Vitals:   08/17/18 0747 08/17/18 0842  BP:  (!) 141/82  Pulse:  (!) 55  Resp:    Temp:  98.4 F (36.9 C)  SpO2: 92% 92%     Intake/Output Summary (Last 24 hours) at 08/17/2018 0847 Last data filed at 08/17/2018 0654 Gross per 24 hour  Intake 1550 ml  Output 725 ml  Net 825 ml    General:  Middle aged man, lying in bed, resting comfortably. Slightly increased work of breathing noted. HEENT: normal Neck: supple. no JVD. Carotids 2+ bilat; no bruits. No lymphadenopathy or thryomegaly appreciated. Cor: PMI nondisplaced. Regular rate & rhythm. No rubs, gallops or murmurs. Lungs: clear Abdomen: soft, nontender, nondistended. No hepatosplenomegaly. No bruits or masses. Good bowel sounds. Extremities: no cyanosis, clubbing, rash,  edema Neuro: alert & oriented x 3, cranial nerves grossly intact. moves all 4 extremities w/o difficulty. Affect pleasant.  ECG: Sinus bradycardia 56bpm, T wave inversion in inferolateral leads  Results for orders placed or performed during the hospital encounter of 08/16/18 (from the past 24 hour(s))  Lactic acid, plasma     Status: None   Collection Time: 08/16/18 10:26 PM  Result Value Ref Range   Lactic Acid, Venous 1.9 0.5 - 1.9 mmol/L  CBC with Differential/Platelet     Status: Abnormal   Collection Time: 08/16/18 10:41 PM  Result Value Ref Range   WBC 9.7 3.8 - 10.6 K/uL   RBC 3.95 (L) 4.40 - 5.90 MIL/uL   Hemoglobin 13.9 13.0 - 18.0 g/dL   HCT 39.5 (L) 40.0 - 52.0 %   MCV 100.1 (H) 80.0 - 100.0 fL   MCH 35.2 (H) 26.0 - 34.0 pg   MCHC 35.2 32.0 - 36.0 g/dL   RDW 13.3 11.5 - 14.5 %   Platelets 331 150 - 440 K/uL   Neutrophils Relative % 77 %   Neutro Abs 7.6 (H) 1.4 - 6.5 K/uL   Lymphocytes Relative 13 %   Lymphs Abs 1.2 1.0 - 3.6 K/uL   Monocytes Relative 8 %   Monocytes Absolute 0.7 0.2 - 1.0 K/uL   Eosinophils Relative 2 %   Eosinophils Absolute 0.1 0 - 0.7 K/uL   Basophils Relative 0 %   Basophils Absolute 0.0 0 - 0.1 K/uL  Basic metabolic panel     Status: Abnormal   Collection Time: 08/16/18 10:41 PM  Result Value Ref Range   Sodium 140 135 - 145 mmol/L   Potassium 3.8 3.5 - 5.1 mmol/L   Chloride 104 98 - 111 mmol/L   CO2 29 22 - 32 mmol/L   Glucose, Bld 120 (H) 70 - 99 mg/dL   BUN 16 6 - 20 mg/dL   Creatinine, Ser 1.15 0.61 - 1.24 mg/dL   Calcium 9.0 8.9 - 10.3 mg/dL   GFR calc non Af Amer >60 >60 mL/min   GFR calc Af Amer >60 >60 mL/min   Anion gap 7 5 - 15  Troponin I     Status: None   Collection Time: 08/16/18 10:41 PM  Result Value Ref Range   Troponin I <0.03 <0.03 ng/mL  Blood Culture (routine x 2)     Status: None (Preliminary result)   Collection Time: 08/17/18 12:54 AM  Result Value Ref Range   Specimen Description BLOOD RIGHT ANTECUBITAL     Special Requests  BOTTLES DRAWN AEROBIC AND ANAEROBIC Blood Culture results may not be optimal due to an excessive volume of blood received in culture bottles   Culture      NO GROWTH < 12 HOURS Performed at [facility], [address] [place], [place] [address]    Report Status PENDING   Blood Culture (routine x 2)     Status: None (Preliminary result)   Collection Time: 08/17/18 12:54 AM  Result Value Ref Range   Specimen Description BLOOD LEFT ANTECUBITAL    Special Requests      BOTTLES DRAWN AEROBIC AND ANAEROBIC Blood Culture adequate volume   Culture      NO GROWTH < 12 HOURS Performed at [facility], [address] [place], [place] [address]    Report Status PENDING   Procalcitonin - Baseline     Status: None   Collection Time: 08/17/18  2:14 AM  Result Value Ref Range   Procalcitonin 1.14 ng/mL  Troponin I-serum (0, 3, 6 hours)     Status: None   Collection Time: 08/17/18  2:14 AM  Result Value Ref Range   Troponin I <0.03 <0.03 ng/mL  CK     Status: None   Collection Time: 08/17/18  2:14 AM  Result Value Ref Range   Total CK 109 49 - 397 U/L  Sedimentation rate     Status: Abnormal   Collection Time: 08/17/18  2:14 AM  Result Value Ref Range   Sed Rate 52 (H) 0 - 15 mm/hr  C-reactive protein     Status: Abnormal   Collection Time: 08/17/18  2:14 AM  Result Value Ref Range   CRP 8.7 (H) <1.0 mg/dL  Hepatic function panel     Status: Abnormal   Collection Time: 08/17/18  2:14 AM  Result Value Ref Range   Total Protein 7.2 6.5 - 8.1 g/dL   Albumin 3.4 (L) 3.5 - 5.0 g/dL   AST 25 15 - 41 U/L   ALT 30 0 - 44 U/L   Alkaline Phosphatase 55 38 - 126 U/L   Total Bilirubin 0.8 0.3 - 1.2 mg/dL   Bilirubin, Direct <0.1 0.0 - 0.2 mg/dL   Indirect Bilirubin NOT CALCULATED 0.3 - 0.9 mg/dL  Magnesium     Status: None   Collection Time: 08/17/18  2:14 AM  Result Value Ref Range   Magnesium 2.0 1.7 - 2.4 mg/dL  Phosphorus     Status:  Abnormal   Collection Time: 08/17/18  2:14 AM  Result Value Ref Range   Phosphorus 2.4 (L) 2.5 - 4.6 mg/dL  Troponin I-serum (0, 3, 6 hours)     Status: None   Collection Time: 08/17/18  5:17 AM  Result Value Ref Range   Troponin I <0.03 <0.03 ng/mL  Folate     Status: None   Collection Time: 08/17/18  5:17 AM  Result Value Ref Range   Folate 6.4 >5.9 ng/mL   Ct Angio Chest Pe W And/or Wo Contrast  Result Date: 08/16/2018 CLINICAL DATA:  Shortness of breath and chest pain since last week while lifting boxes. More severe today. Right chest pain worse with deep breathing. EXAM: CT ANGIOGRAPHY CHEST WITH CONTRAST TECHNIQUE: Multidetector CT imaging of the chest was performed using the standard protocol during bolus administration of intravenous contrast. Multiplanar CT image reconstructions and MIPs were obtained to evaluate the vascular anatomy. CONTRAST:  164mL ISOVUE-370 IOPAMIDOL (ISOVUE-370) INJECTION 76% COMPARISON:  None. FINDINGS: Cardiovascular: Suboptimal contrast bolus limits the  examination. No main or lobar pulmonary artery emboli are demonstrated but segmental or below levels are not well evaluated. Normal heart size. No pericardial effusion. Normal caliber thoracic aorta. Great vessel origins are patent. Mediastinum/Nodes: Right subcarinal lymph node measures up to about 12 mm short axis dimension. No other prominent lymph nodes. Likely reactive. Esophagus is decompressed. Lungs/Pleura: Small right pleural effusion. Bilateral basilar atelectasis or consolidation. Patchy nodular opacities most prominent in the left lung with several discrete nodules identified. Largest is in the left lung base and measures 2 cm diameter. Differential diagnosis would include multifocal pneumonia or metastasis. Short-term follow-up imaging is recommended to exclude metastatic disease. No pneumothorax. Airways are patent. Mild emphysematous changes in the apices. Upper Abdomen: No acute abnormality is  identified. Musculoskeletal: Degenerative changes in the spine. No destructive bone lesions. Review of the MIP images confirms the above findings. IMPRESSION: 1. No evidence of significant pulmonary embolus, although poor bolus limits evaluation of segmental and distal pulmonary arteries. 2. Small right pleural effusion with bilateral basilar atelectasis or consolidation. 3. Patchy nodular opacities most prominent in the left lung with several discrete nodules identified in the left lung base. Differential diagnosis would include multifocal pneumonia or metastasis. Short-term follow-up imaging is recommended to exclude metastatic disease. 4. Mild emphysematous changes in the apices. 5. Borderline enlarged right subcarinal lymph node, likely reactive. Emphysema (ICD10-J43.9). Electronically Signed   By: Lucienne Capers M.D.   On: 08/16/2018 23:48   Dg Chest Portable 1 View  Result Date: 08/16/2018 CLINICAL DATA:  Chest pain and short of breath EXAM: PORTABLE CHEST 1 VIEW COMPARISON:  06/03/2008 FINDINGS: Small right greater than left pleural effusions. Cardiomegaly with vascular congestion. Consolidation at the right middle lobe and right base. No pneumothorax. IMPRESSION: 1. Small right greater than left pleural effusion with consolidation at the right middle lobe and right base. 2. Cardiomegaly with vascular congestion Electronically Signed   By: Donavan Foil M.D.   On: 08/16/2018 22:41     ASSESSMENT AND PLAN: Right sided chest pain with negative troponin and diagnosis of pneumonia. Shortness of breath and chest pain are responding to antibiotic therapy. Echocardiogram pending. Suspect non-cardiac etiology of chest pain, continue current medical treatment and advise outpatient stress test to follow up on non-acute EKG changes.    Jake Bathe, NP-C Cell: [phone]

## 2018-08-17 NOTE — Progress Notes (Deleted)
SATURATION QUALIFICATIONS: (This note is used to comply with regulatory documentation for home oxygen)  Patient Saturations on Room Air at Rest = 87%  Placed on 2 Liters of oxygen via nasal cannula    Please briefly explain why patient needs home oxygen: Patient's oxygen saturation low on room air.

## 2018-08-17 NOTE — Progress Notes (Signed)
*  PRELIMINARY RESULTS* Echocardiogram 2D Echocardiogram has been performed.  Roy [name] 08/17/2018, 3:17 PM

## 2018-08-17 NOTE — Progress Notes (Signed)
Roy [name] NAME: Roy [name]    MR#:  [id number]  DATE OF BIRTH:  July 19, 1971  SUBJECTIVE:  States he is feeling much better today. Chest pain has decreased, although still having pain with taking a deep breath and coughing. Shortness of breath has improved.  REVIEW OF SYSTEMS:  Review of Systems  Constitutional: Negative for chills and fever.  HENT: Negative for congestion and sore throat.   Eyes: Negative for blurred vision and double vision.  Respiratory: Positive for cough and shortness of breath.   Cardiovascular: Positive for chest pain. Negative for leg swelling.  Gastrointestinal: Negative for abdominal pain, nausea and vomiting.  Genitourinary: Negative for dysuria and frequency.  Musculoskeletal: Negative for back pain and neck pain.  Neurological: Negative for dizziness and headaches.  Psychiatric/Behavioral: Negative for depression. The patient is not nervous/anxious.     DRUG ALLERGIES:  No Known Allergies VITALS:  Blood pressure (!) 162/87, pulse 64, temperature 98.8 F (37.1 C), temperature source Oral, resp. rate 20, height 6\' 3"  (1.905 m), weight 114.4 kg, SpO2 94 %. PHYSICAL EXAMINATION:  Physical Exam  Constitutional: He is oriented to person, place, and time. He appears well-developed and well-nourished. In NAD.  HEENT: Normocephalic and atraumatic. MMM. PERRLA, EOMI, No scleral icterus.  Neck: Neck supple. No JVD present. No thyromegaly present.  Cardiovascular: Normal rate, regular rhythm, S1 normal, S2 normal. Exam reveals no gallop, no S3, no S4. No murmur. Pulmonary/Chest: Normal work of breathing, although taking shallow breaths. No wheezing or crackles. [place] in place. Able to speak in full sentences. Abdominal: Soft, +BS, non-tender, non-distended Musculoskeletal: No edema or cyanosis. Neurological: CN 2-12 intact. Normal muscle strength throughout. Sensation intact.  Skin: Skin is warm, dry and  intact. No rash noted.  Psychiatric: Alert and oriented x 3. Normal affect and thought content. LABORATORY PANEL:  Male CBC Recent Labs  Lab 08/16/18 2241  WBC 9.7  HGB 13.9  HCT 39.5*  PLT 331   ------------------------------------------------------------------------------------------------------------------ Chemistries  Recent Labs  Lab 08/16/18 2241 08/17/18 0214  NA 140  --   K 3.8  --   CL 104  --   CO2 29  --   GLUCOSE 120*  --   BUN 16  --   CREATININE 1.15  --   CALCIUM 9.0  --   MG  --  2.0  AST  --  25  ALT  --  30  ALKPHOS  --  55  BILITOT  --  0.8   RADIOLOGY:  Ct Angio Chest Pe W And/or Wo Contrast  Result Date: 08/16/2018 CLINICAL DATA:  Shortness of breath and chest pain since last week while lifting boxes. More severe today. Right chest pain worse with deep breathing. EXAM: CT ANGIOGRAPHY CHEST WITH CONTRAST TECHNIQUE: Multidetector CT imaging of the chest was performed using the standard protocol during bolus administration of intravenous contrast. Multiplanar CT image reconstructions and MIPs were obtained to evaluate the vascular anatomy. CONTRAST:  140mL ISOVUE-370 IOPAMIDOL (ISOVUE-370) INJECTION 76% COMPARISON:  None. FINDINGS: Cardiovascular: Suboptimal contrast bolus limits the examination. No main or lobar pulmonary artery emboli are demonstrated but segmental or below levels are not well evaluated. Normal heart size. No pericardial effusion. Normal caliber thoracic aorta. Great vessel origins are patent. Mediastinum/Nodes: Right subcarinal lymph node measures up to about 12 mm short axis dimension. No other prominent lymph nodes. Likely reactive. Esophagus is decompressed. Lungs/Pleura: Small right pleural effusion. Bilateral basilar atelectasis or consolidation.  Patchy nodular opacities most prominent in the left lung with several discrete nodules identified. Largest is in the left lung base and measures 2 cm diameter. Differential diagnosis would include  multifocal pneumonia or metastasis. Short-term follow-up imaging is recommended to exclude metastatic disease. No pneumothorax. Airways are patent. Mild emphysematous changes in the apices. Upper Abdomen: No acute abnormality is identified. Musculoskeletal: Degenerative changes in the spine. No destructive bone lesions. Review of the MIP images confirms the above findings. IMPRESSION: 1. No evidence of significant pulmonary embolus, although poor bolus limits evaluation of segmental and distal pulmonary arteries. 2. Small right pleural effusion with bilateral basilar atelectasis or consolidation. 3. Patchy nodular opacities most prominent in the left lung with several discrete nodules identified in the left lung base. Differential diagnosis would include multifocal pneumonia or metastasis. Short-term follow-up imaging is recommended to exclude metastatic disease. 4. Mild emphysematous changes in the apices. 5. Borderline enlarged right subcarinal lymph node, likely reactive. Emphysema (ICD10-J43.9). Electronically Signed   By: Lucienne Capers M.D.   On: 08/16/2018 23:48   [place] Chest (pleural Effusion)  Result Date: 08/17/2018 CLINICAL DATA:  47 year old with a right pleural effusion. Evaluate for thoracentesis. EXAM: CHEST ULTRASOUND COMPARISON:  Chest CT 08/16/2018 FINDINGS: Right side of the chest was evaluated with ultrasound. Small right pleural effusion was identified. Not enough fluid for safe thoracentesis. IMPRESSION: Small right pleural effusion.  Not enough fluid for thoracentesis. Electronically Signed   By: Markus Daft M.D.   On: 08/17/2018 15:24   Dg Chest Portable 1 View  Result Date: 08/16/2018 CLINICAL DATA:  Chest pain and short of breath EXAM: PORTABLE CHEST 1 VIEW COMPARISON:  06/03/2008 FINDINGS: Small right greater than left pleural effusions. Cardiomegaly with vascular congestion. Consolidation at the right middle lobe and right base. No pneumothorax. IMPRESSION: 1. Small right greater than  left pleural effusion with consolidation at the right middle lobe and right base. 2. Cardiomegaly with vascular congestion Electronically Signed   By: Donavan Foil M.D.   On: 08/16/2018 22:41   ASSESSMENT AND PLAN:   Acute hypoxic respiratory failure- likely secondary to multifocal pneumonia. CT chest with patchy nodule opacities in the left lung and several discrete nodules in the left lung base- pneumonia vs mets. Also with small right pleural effusion. PCT elevated to 1.14. - IR consulted for thoracentesis, but not enough fluid to drain - initially on vanc, meropenem, and azithromycin, but will change to ceftriaxone and azithromycin today (MRSA PCR negative and no MRSA risk factors) - continue IV solumedrol for today, can likely transition to prednisone tomorrow - continue duonebs - pulmonology consult - rheum/vasculitic/inflammatory panel pending - RVP negative - blood cultures pending - continue incentive spirometry - wean O2 as able  Right sided chest pain- patient does have a history of cardiomyopathy. Troponin negative x 3. - seen by cardiology, who do not feel that his chest pain is cardiac in etiology - ECHO pending - needs outpatient stress test due to EKG changes  Macrocytosis without anemia - B12 and folate pending   All the records are reviewed and case discussed with Care Management/Social Worker. Management plans discussed with the patient, family and they are in agreement.  CODE STATUS: Full Code  TOTAL TIME TAKING CARE OF THIS PATIENT: 40 minutes.   More than 50% of the time was spent in counseling/coordination of care: YES  POSSIBLE D/C IN 1-2 DAYS, DEPENDING ON CLINICAL CONDITION.   Berna Spare Melissia Lahman M.D on 08/17/2018 at 5:04 PM  Between 7am  to 6pm - Pager - [phone]  After 6pm go to [email or web address] - Proofreader  [facility]  Office  [phone]  CC: Primary care physician; Patient, No Pcp Per  Note: This dictation was  prepared with Dragon dictation along with smaller phrase technology. Any transcriptional errors that result from this process are unintentional.

## 2018-08-17 NOTE — Progress Notes (Signed)
Pharmacy Antibiotic Note  Roy [name] is a 47 y.o. male admitted on 08/16/2018 with pneumonia.  Pharmacy has been consulted for vancomycin and meropenem dosing.  Plan: DW 95kg  Vd 67L kei 0.092 hr-1  T1/2 8 hours Vancomycin 1250 mg q 8 hours ordered with stacked dosing. Level before 5th dose. Goal trough 15-20.  Meropenem 1 gram q 8 hours ordered  Height: 6\' 3"  (190.5 cm) Weight: 245 lb (111.1 kg) IBW/kg (Calculated) : 84.5  Temp (24hrs), Avg:98.2 F (36.8 C), Min:98.2 F (36.8 C), Max:98.2 F (36.8 C)  Recent Labs  Lab 08/16/18 2226 08/16/18 2241  WBC  --  9.7  CREATININE  --  1.15  LATICACIDVEN 1.9  --     Estimated Creatinine Clearance: 105.7 mL/min (by C-G formula based on SCr of 1.15 mg/dL).    No Known Allergies  Antimicrobials this admission: Azithromycin 9/3, vancomycin, meropenem 9/4  >>    >>   Dose adjustments this admission:   Microbiology results: 9/4 BCx: pending 9/4 UCx: pending  9/4 Sputum: pending  9/4 MRSA PCR: penidng      9/4 UA: pending 9/3 CXR: Right middle and base consolidation Thank you for allowing pharmacy to be a part of this patient's care.  Montez Cuda S 08/17/2018 1:49 AM

## 2018-08-17 NOTE — Consult Note (Addendum)
Pulmonary Critical Care  Initial Consult Note  Martell L [name] NWG:[id number] DOB: 1971/07/18 DOA: 08/16/2018  Referring physician: Arta Silence  Chief Complaint: chest pain  HPI: Roy [name] is a 47 y.o. male presented to the hospital with complaints of pain in his chest.  Patient has a history of hypertension cardiomyopathy cerebral aneurysm in the past.  Patient says he was having pain on the right side of his chest with increasing shortness of breath.  He thought that initially it was because he was lifting some boxes at the place of his work.  He was lifting heavy boxes and swung it to the right knee felt that this had pulled a muscle.  Pain did not go away it was sharp and moderately severe.  Patient noted that he was having some shortness of breath also.  The pain did improve somewhat however the night before admission pain was now radiating up under his nipple.  And the severity apparently had gotten a lot worse.  He denies having any fever no chills.  Patient has been previously incarcerated.  He does not know if he is ever had any exposure to tuberculosis.  He does use vapor cigarettes.  The patient denies any significant exposures.  He had a CT scan done which shows a confluent nodular infiltrate in the lower lobes.  Also there was a small pleural effusion noted.  Patient apparently went down for potential thoracentesis and it was not enough fluid to be drained.  I personally reviewed the CT scan and actually showed it to the patient and his significant other and it appears to be more consistent with consolidation pneumonia.  Patient has multifocal nature pneumonia at this time.  Review of Systems:  Constitutional:  No weight loss, night sweats, Fevers, chills, fatigue.  HEENT:  No headaches, nasal congestion, post nasal drip,  Cardio-vascular:  +chest pain, Orthopnea, PND, swelling in lower extremities, anasarca, dizziness, palpitations  GI:  No heartburn, indigestion,  abdominal pain, nausea, vomiting, diarrhea  Resp:  +shortness of breath. +cough, No coughing up of blood.No wheezing Skin:  no rash or lesions.  Musculoskeletal:  No joint pain or swelling.   Remainder ROS performed and is unremarkable other than noted in HPI  Past Medical History:  Diagnosis Date  . Aneurysm ([place])   . Cardiomyopathy ([place])    pt sts he no longer has it  . Hypertension   . Renal calculi    Past Surgical History:  Procedure Laterality Date  . ANEURYSM COILING     "a few years ago"   Social History:  reports that he has been smoking cigarettes. He has never used smokeless tobacco. His alcohol and drug histories are not on file.  No Known Allergies  History reviewed. No pertinent family history.  Prior to Admission medications   Medication Sig Start Date End Date Taking? Authorizing Provider  chlorhexidine (PERIDEX) 0.12 % solution SWISH AND SPIT 15 ML IN MOUTH TWICE DAILY FOR 14 DAYS 08/05/18  Yes [provider]  HYDROcodone-acetaminophen (NORCO/VICODIN) 5-325 MG tablet TAKE 1 TABLET BY MOUTH EVERY 6 HOURS AS NEEDED FOR PAIN FOR UP TO 5 DAYS 08/05/18  Yes [provider]  penicillin v potassium (VEETID) 500 MG tablet TAKE 1 TABLET BY MOUTH 4 TIMES DAILY FOR 10 DAYS 08/05/18  Yes [provider]   Physical Exam: Vitals:   08/17/18 0747 08/17/18 0842 08/17/18 0941 08/17/18 1602  BP:  (!) 141/82 139/76 (!) 162/87  Pulse:  (!) 55 Marland Kitchen)  57 64  Resp:    20  Temp:  98.4 F (36.9 C)  98.8 F (37.1 C)  TempSrc:  Oral  Oral  SpO2: 92% 92% 92% 94%  Weight:      Height:        Wt Readings from Last 3 Encounters:  08/17/18 114.4 kg    General:  Appears calm and comfortable Eyes: PERRL, normal lids, irises & conjunctiva ENT: grossly normal hearing, lips & tongue Neck: no LAD, masses or thyromegaly Cardiovascular: RRR, no m/r/g. No LE edema. Respiratory: CTA bilaterally, no w/r/r.       Normal respiratory effort. Abdomen: soft,  nontender Skin: no rash or induration seen on limited exam Musculoskeletal: grossly normal tone BUE/BLE Psychiatric: grossly normal mood and affect Neurologic: grossly non-focal.          Labs on Admission:  Basic Metabolic Panel: Recent Labs  Lab 08/16/18 2241 08/17/18 0214  NA 140  --   K 3.8  --   CL 104  --   CO2 29  --   GLUCOSE 120*  --   BUN 16  --   CREATININE 1.15  --   CALCIUM 9.0  --   MG  --  2.0  PHOS  --  2.4*   Liver Function Tests: Recent Labs  Lab 08/17/18 0214  AST 25  ALT 30  ALKPHOS 55  BILITOT 0.8  PROT 7.2  ALBUMIN 3.4*   No results for input(s): LIPASE, AMYLASE in the last 168 hours. No results for input(s): AMMONIA in the last 168 hours. CBC: Recent Labs  Lab 08/16/18 2241  WBC 9.7  NEUTROABS 7.6*  HGB 13.9  HCT 39.5*  MCV 100.1*  PLT 331   Cardiac Enzymes: Recent Labs  Lab 08/16/18 2241 08/17/18 0214 08/17/18 0517  CKTOTAL  --  109  --   TROPONINI <0.03 <0.03 <0.03    BNP (last 3 results) No results for input(s): BNP in the last 8760 hours.  ProBNP (last 3 results) No results for input(s): PROBNP in the last 8760 hours.  CBG: No results for input(s): GLUCAP in the last 168 hours.  Radiological Exams on Admission: Ct Angio Chest Pe W And/or Wo Contrast  Result Date: 08/16/2018 CLINICAL DATA:  Shortness of breath and chest pain since last week while lifting boxes. More severe today. Right chest pain worse with deep breathing. EXAM: CT ANGIOGRAPHY CHEST WITH CONTRAST TECHNIQUE: Multidetector CT imaging of the chest was performed using the standard protocol during bolus administration of intravenous contrast. Multiplanar CT image reconstructions and MIPs were obtained to evaluate the vascular anatomy. CONTRAST:  144mL ISOVUE-370 IOPAMIDOL (ISOVUE-370) INJECTION 76% COMPARISON:  None. FINDINGS: Cardiovascular: Suboptimal contrast bolus limits the examination. No main or lobar pulmonary artery emboli are demonstrated but  segmental or below levels are not well evaluated. Normal heart size. No pericardial effusion. Normal caliber thoracic aorta. Great vessel origins are patent. Mediastinum/Nodes: Right subcarinal lymph node measures up to about 12 mm short axis dimension. No other prominent lymph nodes. Likely reactive. Esophagus is decompressed. Lungs/Pleura: Small right pleural effusion. Bilateral basilar atelectasis or consolidation. Patchy nodular opacities most prominent in the left lung with several discrete nodules identified. Largest is in the left lung base and measures 2 cm diameter. Differential diagnosis would include multifocal pneumonia or metastasis. Short-term follow-up imaging is recommended to exclude metastatic disease. No pneumothorax. Airways are patent. Mild emphysematous changes in the apices. Upper Abdomen: No acute abnormality is identified. Musculoskeletal: Degenerative changes in the  spine. No destructive bone lesions. Review of the MIP images confirms the above findings. IMPRESSION: 1. No evidence of significant pulmonary embolus, although poor bolus limits evaluation of segmental and distal pulmonary arteries. 2. Small right pleural effusion with bilateral basilar atelectasis or consolidation. 3. Patchy nodular opacities most prominent in the left lung with several discrete nodules identified in the left lung base. Differential diagnosis would include multifocal pneumonia or metastasis. Short-term follow-up imaging is recommended to exclude metastatic disease. 4. Mild emphysematous changes in the apices. 5. Borderline enlarged right subcarinal lymph node, likely reactive. Emphysema (ICD10-J43.9). Electronically Signed   By: Lucienne Capers M.D.   On: 08/16/2018 23:48   [place] Chest (pleural Effusion)  Result Date: 08/17/2018 CLINICAL DATA:  47 year old with a right pleural effusion. Evaluate for thoracentesis. EXAM: CHEST ULTRASOUND COMPARISON:  Chest CT 08/16/2018 FINDINGS: Right side of the chest was  evaluated with ultrasound. Small right pleural effusion was identified. Not enough fluid for safe thoracentesis. IMPRESSION: Small right pleural effusion.  Not enough fluid for thoracentesis. Electronically Signed   By: Markus Daft M.D.   On: 08/17/2018 15:24   Dg Chest Portable 1 View  Result Date: 08/16/2018 CLINICAL DATA:  Chest pain and short of breath EXAM: PORTABLE CHEST 1 VIEW COMPARISON:  06/03/2008 FINDINGS: Small right greater than left pleural effusions. Cardiomegaly with vascular congestion. Consolidation at the right middle lobe and right base. No pneumothorax. IMPRESSION: 1. Small right greater than left pleural effusion with consolidation at the right middle lobe and right base. 2. Cardiomegaly with vascular congestion Electronically Signed   By: Donavan Foil M.D.   On: 08/16/2018 22:41    EKG: Independently reviewed.  Assessment/Plan Active Problems:   Right-sided chest pain   1. Right lower lobe multifocal pneumonia currently patient is on azithromycin as well as Rocephin for coverage.  I do not think this is malignancy however he will need a follow-up CT scan done to make certain that there has been clearing of the infection.  In addition he does use vapor cigarettes and therefore certainly a reaction to the cigarettes could be presenting in a similar fashion.  He has been strongly urged to stop using any type of tobacco product.  I spoke with him as well as his significant other discussed the potential of side effects.  Other possibilities do include inflammatory disorders such as sarcoidosis which could present in this manner.  I do not think this is malignancy as noted but again needs to be followed up after discharge 2. Pleural effusion ultrasound was done there does not appear to be enough fluid to be tapped at this time.  I would do a follow-up x-ray prior to discharge to make certain that no further fluid has accumulated. 3. Chest pain appears to be pleuritic in nature based  on the radiological findings he has an underlying pneumonia and this is probably causing pleurisy.  Continue with supportive care at this time. 4. Cardiomyopathy would continue with supportive care I doubt that this is actually congestive heart failure  Patient will need to see me back in the office after discharge for follow-up appointment can be made by calling [phone]  Code Status: Full code  Family Communication: Present in the room Disposition Plan: Home  Time spent: 70 minutes  I have personally obtained a history, examined the patient, evaluated laboratory and imaging results, formulated the assessment and plan and placed orders.  The Patient requires high complexity decision making for assessment and support. Total Time Spent  68min   Kerney Hopfensperger A Quindon Denker, MD [facility] Pulmonary Critical Care Medicine Sleep Medicine

## 2018-08-17 NOTE — Progress Notes (Signed)
Paged Dr. Brett Albino if patient can have sleeping aid, order for Trazadone given. RN will continue to monitor.

## 2018-08-18 LAB — URINE CULTURE: CULTURE: NO GROWTH

## 2018-08-18 LAB — CBC
HCT: 35.5 % — ABNORMAL LOW (ref 40.0–52.0)
HEMOGLOBIN: 12 g/dL — AB (ref 13.0–18.0)
MCH: 34.1 pg — AB (ref 26.0–34.0)
MCHC: 33.9 g/dL (ref 32.0–36.0)
MCV: 100.5 fL — ABNORMAL HIGH (ref 80.0–100.0)
PLATELETS: 324 10*3/uL (ref 150–440)
RBC: 3.53 MIL/uL — ABNORMAL LOW (ref 4.40–5.90)
RDW: 13.2 % (ref 11.5–14.5)
WBC: 14.9 10*3/uL — ABNORMAL HIGH (ref 3.8–10.6)

## 2018-08-18 LAB — BASIC METABOLIC PANEL
Anion gap: 7 (ref 5–15)
BUN: 14 mg/dL (ref 6–20)
CALCIUM: 8.8 mg/dL — AB (ref 8.9–10.3)
CHLORIDE: 103 mmol/L (ref 98–111)
CO2: 29 mmol/L (ref 22–32)
CREATININE: 0.83 mg/dL (ref 0.61–1.24)
Glucose, Bld: 154 mg/dL — ABNORMAL HIGH (ref 70–99)
Potassium: 4.5 mmol/L (ref 3.5–5.1)
SODIUM: 139 mmol/L (ref 135–145)

## 2018-08-18 LAB — ANCA TITERS

## 2018-08-18 LAB — HEMOGLOBIN A1C
HEMOGLOBIN A1C: 6.3 % — AB (ref 4.8–5.6)
MEAN PLASMA GLUCOSE: 134.11 mg/dL

## 2018-08-18 LAB — ANA W/REFLEX IF POSITIVE: Anti Nuclear Antibody(ANA): NEGATIVE

## 2018-08-18 LAB — HEPATITIS C ANTIBODY (REFLEX)

## 2018-08-18 LAB — C4 COMPLEMENT: COMPLEMENT C4, BODY FLUID: 42 mg/dL (ref 14–44)

## 2018-08-18 LAB — PROCALCITONIN: PROCALCITONIN: 2 ng/mL

## 2018-08-18 LAB — ECHOCARDIOGRAM COMPLETE
Height: 75 in
Weight: 4036.8 oz

## 2018-08-18 LAB — MPO/PR-3 (ANCA) ANTIBODIES: ANCA Proteinase 3: <3.5 U/mL (ref 0.0–3.5)

## 2018-08-18 LAB — RHEUMATOID FACTOR

## 2018-08-18 LAB — HEPATITIS B CORE ANTIBODY, TOTAL: HEP B C TOTAL AB: NEGATIVE

## 2018-08-18 LAB — C3 COMPLEMENT: C3 Complement: 156 mg/dL (ref 82–167)

## 2018-08-18 LAB — HEPATITIS B SURFACE ANTIGEN: Hepatitis B Surface Ag: NEGATIVE

## 2018-08-18 LAB — HEPATITIS B SURFACE ANTIBODY, QUANTITATIVE

## 2018-08-18 LAB — GLOMERULAR BASEMENT MEMBRANE ANTIBODIES: GBM Ab: 2 units (ref 0–20)

## 2018-08-18 LAB — RPR: RPR: NONREACTIVE

## 2018-08-18 LAB — HIV ANTIBODY (ROUTINE TESTING W REFLEX): HIV Screen 4th Generation wRfx: NONREACTIVE

## 2018-08-18 LAB — COMPLEMENT, TOTAL: Compl, Total (CH50): >60 U/mL (ref 42–999999)

## 2018-08-18 LAB — HEPATITIS B CORE ANTIBODY, IGM: Hep B C IgM: NEGATIVE

## 2018-08-18 LAB — HCV COMMENT:

## 2018-08-18 MED ORDER — IBUPROFEN 400 MG PO TABS
400.0000 mg | ORAL_TABLET | Freq: Four times a day (QID) | ORAL | Status: DC | PRN
  Administered 2018-08-18: 400 mg via ORAL
  Filled 2018-08-18: qty 1

## 2018-08-18 MED ORDER — CEFDINIR 300 MG PO CAPS
300.0000 mg | ORAL_CAPSULE | Freq: Two times a day (BID) | ORAL | Status: DC
  Administered 2018-08-18 – 2018-08-19 (×3): 300 mg via ORAL
  Filled 2018-08-18 (×4): qty 1

## 2018-08-18 MED ORDER — MORPHINE SULFATE (PF) 4 MG/ML IV SOLN
4.0000 mg | INTRAVENOUS | Status: DC | PRN
  Administered 2018-08-18 – 2018-08-19 (×2): 4 mg via INTRAVENOUS
  Filled 2018-08-18 (×2): qty 1

## 2018-08-18 MED ORDER — AZITHROMYCIN 250 MG PO TABS
250.0000 mg | ORAL_TABLET | Freq: Every day | ORAL | Status: DC
  Administered 2018-08-18 – 2018-08-19 (×2): 250 mg via ORAL
  Filled 2018-08-18 (×2): qty 1

## 2018-08-18 MED ORDER — BUTALBITAL-APAP-CAFFEINE 50-325-40 MG PO TABS
1.0000 | ORAL_TABLET | Freq: Four times a day (QID) | ORAL | Status: DC | PRN
  Administered 2018-08-18: 1 via ORAL
  Filled 2018-08-18 (×2): qty 1

## 2018-08-18 NOTE — Progress Notes (Signed)
SUBJECTIVE: has right sided chest pain   Vitals:   08/17/18 2123 08/18/18 0526 08/18/18 0753 08/18/18 0930  BP:  125/71  (!) 143/73  Pulse:  80  84  Resp:    18  Temp:  98.2 F (36.8 C)    TempSrc:  Oral    SpO2: 94% 94% 96% 95%  Weight:      Height:        Intake/Output Summary (Last 24 hours) at 08/18/2018 0949 Last data filed at 08/18/2018 0347 Gross per 24 hour  Intake 1197.54 ml  Output 1930 ml  Net -732.46 ml    LABS: Basic Metabolic Panel: Recent Labs    08/16/18 2241 08/17/18 0214 08/18/18 0401  NA 140  --  139  K 3.8  --  4.5  CL 104  --  103  CO2 29  --  29  GLUCOSE 120*  --  154*  BUN 16  --  14  CREATININE 1.15  --  0.83  CALCIUM 9.0  --  8.8*  MG  --  2.0  --   PHOS  --  2.4*  --    Liver Function Tests: Recent Labs    08/17/18 0214  AST 25  ALT 30  ALKPHOS 55  BILITOT 0.8  PROT 7.2  ALBUMIN 3.4*   No results for input(s): LIPASE, AMYLASE in the last 72 hours. CBC: Recent Labs    08/16/18 2241 08/18/18 0401  WBC 9.7 14.9*  NEUTROABS 7.6*  --   HGB 13.9 12.0*  HCT 39.5* 35.5*  MCV 100.1* 100.5*  PLT 331 324   Cardiac Enzymes: Recent Labs    08/16/18 2241 08/17/18 0214 08/17/18 0517  CKTOTAL  --  109  --   TROPONINI <0.03 <0.03 <0.03   BNP: Invalid input(s): POCBNP D-Dimer: No results for input(s): DDIMER in the last 72 hours. Hemoglobin A1C: No results for input(s): HGBA1C in the last 72 hours. Fasting Lipid Panel: No results for input(s): CHOL, HDL, LDLCALC, TRIG, CHOLHDL, LDLDIRECT in the last 72 hours. Thyroid Function Tests: No results for input(s): TSH, T4TOTAL, T3FREE, THYROIDAB in the last 72 hours.  Invalid input(s): FREET3 Anemia Panel: Recent Labs    08/17/18 0517  VITAMINB12 209  FOLATE 6.4     PHYSICAL EXAM General: Well developed, well nourished, in no acute distress HEENT:  Normocephalic and atramatic Neck:  No JVD.  Lungs: Clear bilaterally to auscultation and percussion. Heart: HRRR . Normal S1  and S2 without gallops or murmurs.  Abdomen: Bowel sounds are positive, abdomen soft and non-tender  Msk:  Back normal, normal gait. Normal strength and tone for age. Extremities: No clubbing, cyanosis or edema.   Neuro: Alert and oriented X 3. Psych:  Good affect, responds appropriately  TELEMETRY: NSR ASSESSMENT AND PLAN: Atypical chest pain with negative troponins but t wave inversion laterally. Advise out patient f/u after treatment of pneumonia.  Active Problems:   Right-sided chest pain    Demetries Coia A, MD, [facility] 08/18/2018 9:49 AM

## 2018-08-18 NOTE — Care Management (Addendum)
Patient resides in [place] and just visiting in [place].  Has no insurance.  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  He will require review of discharge meds and most likely assistance with medications at discharge. CM discussed [facility] for one time assistance if dc meds too costly and are on the formulary, as he resides in [facility] and plans to return.  It may be that patient would be able to afford if not too costly- ie on [employer] four dollar list or Good Rx coupons. He does confirm he does not have a pcp in [place] and not interested in pursing one right now.

## 2018-08-18 NOTE — Progress Notes (Signed)
SATURATION QUALIFICATIONS: (This note is used to comply with regulatory documentation for home oxygen)  Patient Saturations on Room Air at Rest = 95%  Patient Saturations on Room Air while Ambulating = 93%  Patient Saturations on na Liters of oxygen while Ambulating = na%  Please briefly explain why patient needs home oxygen: 

## 2018-08-18 NOTE — Progress Notes (Signed)
Pt refused tx for 0200 hr, pt stated earlier in the night he did not want to wake up for tx.

## 2018-08-18 NOTE — Progress Notes (Addendum)
[place] at [place] NAME: Roy [name]    MR#:  [id number]  DATE OF BIRTH:  1971-07-31  SUBJECTIVE:  Continues to feel better today. Off supplemental O2. Still having cough. Chest pain much improved.   REVIEW OF SYSTEMS:  Review of Systems  Constitutional: Negative for chills and fever.  HENT: Negative for congestion and sore throat.   Eyes: Negative for blurred vision and double vision.  Respiratory: Positive for cough and shortness of breath.   Cardiovascular: Positive for chest pain. Negative for leg swelling.  Gastrointestinal: Negative for abdominal pain, nausea and vomiting.  Genitourinary: Negative for dysuria and frequency.  Musculoskeletal: Negative for back pain and neck pain.  Neurological: Negative for dizziness and headaches.  Psychiatric/Behavioral: Negative for depression. The patient is not nervous/anxious.     DRUG ALLERGIES:  No Known Allergies VITALS:  Blood pressure (!) 143/73, pulse 84, temperature 98.2 F (36.8 C), temperature source Oral, resp. rate 18, height 6\' 3"  (1.905 m), weight 114.4 kg, SpO2 94 %. PHYSICAL EXAMINATION:  Physical Exam  Constitutional: He is oriented to person, place, and time. He appears well-developed and well-nourished. In NAD.  HEENT: Normocephalic and atraumatic. MMM. PERRLA, EOMI, no scleral icterus.  Neck: Neck supple. No JVD present. No thyromegaly present.  Cardiovascular: RRR, no murmurs, rubs, gallops Pulmonary/Chest: Normal work of breathing, No wheezing or crackles. +scattered rhonchi. Able to speak in full sentences. Abdominal: Soft, +BS, non-tender, non-distended Musculoskeletal: No edema or cyanosis. Neurological: CN 2-12 intact. Normal muscle strength throughout. Sensation intact.  Skin: Skin is warm, dry and intact. No rash noted.  Psychiatric: Alert and oriented x 3. Normal affect and thought content. LABORATORY PANEL:  Male CBC Recent Labs  Lab 08/18/18 0401   WBC 14.9*  HGB 12.0*  HCT 35.5*  PLT 324   ------------------------------------------------------------------------------------------------------------------ Chemistries  Recent Labs  Lab 08/17/18 0214 08/18/18 0401  NA  --  139  K  --  4.5  CL  --  103  CO2  --  29  GLUCOSE  --  154*  BUN  --  14  CREATININE  --  0.83  CALCIUM  --  8.8*  MG 2.0  --   AST 25  --   ALT 30  --   ALKPHOS 55  --   BILITOT 0.8  --    RADIOLOGY:  No results found. ASSESSMENT AND PLAN:   Acute hypoxic respiratory failure- likely secondary to multifocal pneumonia. PCT elevated. On room air today. - IR consulted for thoracentesis, but not enough fluid to drain - initially on vanc, meropenem, and azithromycin; transitioned to CTX/azithro 9/4, will change to po omnicef and azithromycin today for total 5 day course. - change from IV solumedrol to prednisone today - continue duonebs - pulmonology consulted- needs outpatient f/u - rheum/vasculitic/inflammatory panel pending - RVP negative - blood cultures with no growth - continue incentive spirometry - plan to ambulate with pulse ox tomorrow - repeat CXR tomorrow morning  Right sided chest pain- patient does have a history of cardiomyopathy. Troponin negative x 3. - seen by cardiology, who do not feel that his chest pain is cardiac in etiology. Needs outpatient f/u. - ECHO pending  Macrocytosis without anemia - B12 and folate normal - monitor  Hyperglycemia- glucose elevated to 154 this morning. Likely due to steroids. - check A1c  All the records are reviewed and case discussed with Care Management/Social Worker. Management plans discussed with the patient, family and  they are in agreement.  CODE STATUS: Full Code  TOTAL TIME TAKING CARE OF THIS PATIENT: 40 minutes.   More than 50% of the time was spent in counseling/coordination of care: YES  POSSIBLE D/C tomorrow, DEPENDING ON CLINICAL CONDITION.   Berna Spare Andrzej Scully M.D on 08/18/2018  at 1:37 PM  Between 7am to 6pm - Pager - [phone]  After 6pm go to [email or web address] - Proofreader  [facility]  Office  [phone]  CC: Primary care physician; Patient, No Pcp Per  Note: This dictation was prepared with Dragon dictation along with smaller phrase technology. Any transcriptional errors that result from this process are unintentional.

## 2018-08-19 ENCOUNTER — Inpatient Hospital Stay: Payer: Self-pay

## 2018-08-19 LAB — BASIC METABOLIC PANEL
ANION GAP: 9 (ref 5–15)
BUN: 15 mg/dL (ref 6–20)
CALCIUM: 8.6 mg/dL — AB (ref 8.9–10.3)
CO2: 28 mmol/L (ref 22–32)
Chloride: 105 mmol/L (ref 98–111)
Creatinine, Ser: 0.73 mg/dL (ref 0.61–1.24)
GFR calc Af Amer: >60 mL/min (ref >60)
Glucose, Bld: 115 mg/dL — ABNORMAL HIGH (ref 70–99)
POTASSIUM: 3.9 mmol/L (ref 3.5–5.1)
Sodium: 142 mmol/L (ref 135–145)

## 2018-08-19 LAB — CBC
HEMATOCRIT: 34.5 % — AB (ref 40.0–52.0)
Hemoglobin: 12.1 g/dL — ABNORMAL LOW (ref 13.0–18.0)
MCH: 35 pg — AB (ref 26.0–34.0)
MCHC: 35.1 g/dL (ref 32.0–36.0)
MCV: 99.6 fL (ref 80.0–100.0)
PLATELETS: 322 10*3/uL (ref 150–440)
RBC: 3.46 MIL/uL — ABNORMAL LOW (ref 4.40–5.90)
RDW: 13.2 % (ref 11.5–14.5)
WBC: 11.4 10*3/uL — ABNORMAL HIGH (ref 3.8–10.6)

## 2018-08-19 LAB — PROCALCITONIN: Procalcitonin: 0.99 ng/mL

## 2018-08-19 LAB — LEGIONELLA PNEUMOPHILA SEROGP 1 UR AG: L. pneumophila Serogp 1 Ur Ag: NEGATIVE

## 2018-08-19 MED ORDER — LISINOPRIL 5 MG PO TABS
5.0000 mg | ORAL_TABLET | Freq: Every day | ORAL | Status: DC
  Administered 2018-08-19: 5 mg via ORAL
  Filled 2018-08-19: qty 1

## 2018-08-19 MED ORDER — AZITHROMYCIN 250 MG PO TABS: 250.0000 mg | ORAL_TABLET | Freq: Every day | ORAL | 0 refills | Status: DC

## 2018-08-19 MED ORDER — TRAMADOL HCL 50 MG PO TABS
50.0000 mg | ORAL_TABLET | Freq: Four times a day (QID) | ORAL | Status: DC | PRN
  Administered 2018-08-19: 50 mg via ORAL
  Filled 2018-08-19: qty 1

## 2018-08-19 MED ORDER — NICOTINE 14 MG/24HR TD PT24
14.0000 mg | MEDICATED_PATCH | Freq: Every day | TRANSDERMAL | Status: DC
  Administered 2018-08-19: 14 mg via TRANSDERMAL
  Filled 2018-08-19: qty 1

## 2018-08-19 MED ORDER — CEFDINIR 300 MG PO CAPS: 300.0000 mg | ORAL_CAPSULE | Freq: Two times a day (BID) | ORAL | 0 refills | Status: DC

## 2018-08-19 MED ORDER — TRAMADOL HCL 50 MG PO TABS: 50.0000 mg | ORAL_TABLET | Freq: Four times a day (QID) | ORAL | 0 refills | Status: DC | PRN

## 2018-08-19 MED ORDER — IBUPROFEN 800 MG PO TABS: 800.0000 mg | ORAL_TABLET | Freq: Four times a day (QID) | ORAL | 0 refills | Status: DC | PRN

## 2018-08-19 MED ORDER — SODIUM CHLORIDE 0.9% FLUSH
3.0000 mL | INTRAVENOUS | Status: DC | PRN
  Administered 2018-08-19: 3 mL via INTRAVENOUS

## 2018-08-19 MED ORDER — LISINOPRIL 5 MG PO TABS: 5.0000 mg | ORAL_TABLET | Freq: Every day | ORAL | 0 refills | Status: DC

## 2018-08-19 MED ORDER — IBUPROFEN 400 MG PO TABS
800.0000 mg | ORAL_TABLET | Freq: Four times a day (QID) | ORAL | Status: DC | PRN
  Administered 2018-08-19: 800 mg via ORAL
  Filled 2018-08-19: qty 2

## 2018-08-19 MED ORDER — PREDNISONE 20 MG PO TABS: 40.0000 mg | ORAL_TABLET | Freq: Every day | ORAL | 0 refills | Status: DC

## 2018-08-19 NOTE — Care Management Note (Signed)
Case Management Note  Patient Details  Name: Seger L Martinique MRN: [id number] Date of Birth: 1971/10/10  Subjective/Objective:       Patient resides in [facility].  He was here visiting his grandmother who lives in [place] when he started having chest pain.  Does not have insurance and states it will be difficult for him to afford mediations as he has been out of work.  Provided him with Match assistance and educated him on where pick up prescriptions and Match coverage.  Patient does not require any services in the home.  Ambulates and performs ADL's without difficulty.  Denies transportation issues.  His "lady" will be picking him up today.  Follow up appointment scheduled.  Verbalizes understanding.  No further needs at this time.      Action/Plan:   Expected Discharge Date:  08/19/18               Expected Discharge Plan:  Home/Self Care  In-House Referral:     Discharge planning Services  CM Consult, [facility] Program  Post Acute Care Choice:    Choice offered to:  Patient  DME Arranged:    DME Agency:     HH Arranged:    [place] [employer] Agency:     Status of Service:  Completed, signed off  If discussed at [employer], dates discussed:    Additional Comments:  Elza Rafter, RN 08/19/2018, 11:43 AM

## 2018-08-19 NOTE — Discharge Instructions (Signed)
It was so nice to meet you!  You came into the hospital with chest pain and shortness of breath. We diagnosed you with a pneumonia.  I have prescribed you two antibiotics to start taking tomorrow (9/7): 1) Please take Azithromycin once daily for 2 days. 2) Please take Omnicef twice daily for 2 days.  You should also take the steroid (prednisone) once daily for 2 days (starting tomorrow)  Your blood pressure was high when you were here. I started you on Lisinopril 5mg . Please take this once daily.  Make sure you follow-up with the lung doctor in 2 weeks and the heart doctor in 2 weeks.  -Dr. Brett Albino

## 2018-08-19 NOTE — Discharge Summary (Signed)
Roy [name] NAME: Roy [name]    MR#:  [id number]  DATE OF BIRTH:  December 16, 1970  DATE OF ADMISSION:  08/16/2018   ADMITTING PHYSICIAN: Arta Silence, MD  DATE OF DISCHARGE: 08/19/18  PRIMARY CARE PHYSICIAN: Patient, No Pcp Per   ADMISSION DIAGNOSIS:  Acute respiratory failure with hypoxia ([place]) [J96.01] DISCHARGE DIAGNOSIS:  Active Problems:   Right-sided chest pain  SECONDARY DIAGNOSIS:   Past Medical History:  Diagnosis Date  . Aneurysm ([place])   . Cardiomyopathy ([place])    pt sts he no longer has it  . Hypertension   . Renal calculi    HOSPITAL COURSE:   Trigg is a 47 year old male with a PMH of HTN and cardiomyopathy who presented to the ED with right sided chest pain and shortness of breath. CT chest showed patchy nodule opacities in the left lung and several discrete nodules in the left lung base- felt to be secondary to pneumonia vs metastases. He was requiring 2L O2 by nasal cannula. He was admitted for further management.  Acute hypoxic respiratory failure- likely secondary to multifocal pneumonia. PCT elevated. - IR consulted for thoracentesis on 9/5, but not enough fluid to drain - initially on vanc, meropenem, and azithromycin; transitioned to CTX/azithro 9/4, then changed to po omnicef and azithromycin total 5 day course. - initially on IV solumedrol, then changed to prednisone for 5 day course - pulmonology consulted- needs outpatient f/u for repeat CT chest - rheum/vasculitic/inflammatory panel pending - RVP negative - blood cultures with no growth - patient able to maintain O2 saturations > 93% during ambulation - tobacco cessation counseling provided  Right sided chest pain- patient does have a history of cardiomyopathy.  - troponins negative x 3. - seen by cardiology, who do not feel that his chest pain is cardiac in etiology. Needs outpatient f/u. - ECHO with EF 70% and G1DD  Macrocytosis  without anemia - B12 and folate normal  Prediabetes- A1c 6.3% this admission  Hypertension- blood pressures high throughout hospitalization - started on lisinopril 5mg  daily  DISCHARGE CONDITIONS:  Multifocal pneumonia Macrocytosis Cardiomyopathy Prediabetes HTN  CONSULTS OBTAINED:  Treatment Team:  Arta Silence, MD Allyne Gee, MD Dionisio David, MD DRUG ALLERGIES:  No Known Allergies DISCHARGE MEDICATIONS:   Allergies as of 08/19/2018   No Known Allergies     Medication List    STOP taking these medications   chlorhexidine 0.12 % solution Commonly known as:  PERIDEX   HYDROcodone-acetaminophen 5-325 MG tablet Commonly known as:  NORCO/VICODIN   penicillin v potassium 500 MG tablet Commonly known as:  VEETID     TAKE these medications   azithromycin 250 MG tablet Commonly known as:  ZITHROMAX Take 1 tablet (250 mg total) by mouth daily. Starting 9/7   cefdinir 300 MG capsule Commonly known as:  OMNICEF Take 1 capsule (300 mg total) by mouth 2 (two) times daily. Starting 9/7   ibuprofen 800 MG tablet Commonly known as:  ADVIL,MOTRIN Take 1 tablet (800 mg total) by mouth every 6 (six) hours as needed for mild pain or moderate pain.   lisinopril 5 MG tablet Commonly known as:  PRINIVIL,ZESTRIL Take 1 tablet (5 mg total) by mouth daily. Start taking on:  08/20/2018   predniSONE 20 MG tablet Commonly known as:  DELTASONE Take 2 tablets (40 mg total) by mouth daily with breakfast. Starting 9/7 Start taking on:  08/20/2018   traMADol 50 MG  tablet Commonly known as:  ULTRAM Take 1 tablet (50 mg total) by mouth every 6 (six) hours as needed for severe pain.        DISCHARGE INSTRUCTIONS:  1. F/u with PCP in 1-2 weeks 2. F/u with pulmonology in 2 weeks- needs repeat CT chest 3. F/u with cardiology in 3-4 weeks 4. BPs high- started on lisinopril 5mg  daily; monitor BPs as an outpatient DIET:  Cardiac diet DISCHARGE CONDITION:   Stable ACTIVITY:  Activity as tolerated OXYGEN:  Home Oxygen: No.  Oxygen Delivery: room air DISCHARGE LOCATION:  home   If you experience worsening of your admission symptoms, develop shortness of breath, life threatening emergency, suicidal or homicidal thoughts you must seek medical attention immediately by calling 911 or calling your MD immediately  if symptoms less severe.  You Must read complete instructions/literature along with all the possible adverse reactions/side effects for all the Medicines you take and that have been prescribed to you. Take any new Medicines after you have completely understood and accpet all the possible adverse reactions/side effects.   Please note  You were cared for by a hospitalist during your hospital stay. If you have any questions about your discharge medications or the care you received while you were in the hospital after you are discharged, you can call the unit and asked to speak with the hospitalist on call if the hospitalist that took care of you is not available. Once you are discharged, your primary care physician will handle any further medical issues. Please note that NO REFILLS for any discharge medications will be authorized once you are discharged, as it is imperative that you return to your primary care physician (or establish a relationship with a primary care physician if you do not have one) for your aftercare needs so that they can reassess your need for medications and monitor your lab values.    On the day of Discharge:  VITAL SIGNS:  Blood pressure (!) 156/89, pulse 78, temperature 98.3 F (36.8 C), temperature source Oral, resp. rate 18, height 6\' 3"  (1.905 m), weight 114.4 kg, SpO2 93 %. PHYSICAL EXAMINATION:  GENERAL:  47 y.o.-year-old patient lying in the bed with no acute distress.  EYES: Pupils equal, round, reactive to light and accommodation. No scleral icterus. Extraocular muscles intact.  HEENT: Head atraumatic,  normocephalic. Oropharynx and nasopharynx clear.  NECK:  Supple, no jugular venous distention. No thyroid enlargement, no tenderness.  LUNGS: Normal breath sounds bilaterally, no wheezing, rales,rhonchi or crepitation. No use of accessory muscles of respiration.  CARDIOVASCULAR: S1, S2 normal. No murmurs, rubs, or gallops.  ABDOMEN: Soft, non-tender, non-distended. Bowel sounds present. No organomegaly or mass.  EXTREMITIES: No pedal edema, cyanosis, or clubbing.  NEUROLOGIC: Cranial nerves II through XII are intact. Muscle strength 5/5 in all extremities. Sensation intact. Gait not checked.  PSYCHIATRIC: The patient is alert and oriented x 3.  SKIN: No obvious rash, lesion, or ulcer.  DATA REVIEW:   CBC Recent Labs  Lab 08/19/18 0508  WBC 11.4*  HGB 12.1*  HCT 34.5*  PLT 322    Chemistries  Recent Labs  Lab 08/17/18 0214  08/19/18 0508  NA  --    < > 142  K  --    < > 3.9  CL  --    < > 105  CO2  --    < > 28  GLUCOSE  --    < > 115*  BUN  --    < >  15  CREATININE  --    < > 0.73  CALCIUM  --    < > 8.6*  MG 2.0  --   --   AST 25  --   --   ALT 30  --   --   ALKPHOS 55  --   --   BILITOT 0.8  --   --    < > = values in this interval not displayed.     Microbiology Results  Results for orders placed or performed during the hospital encounter of 08/16/18  Blood Culture (routine x 2)     Status: None (Preliminary result)   Collection Time: 08/17/18 12:54 AM  Result Value Ref Range Status   Specimen Description BLOOD RIGHT ANTECUBITAL  Final   Special Requests   Final    BOTTLES DRAWN AEROBIC AND ANAEROBIC Blood Culture results may not be optimal due to an excessive volume of blood received in culture bottles   Culture   Final    NO GROWTH 2 DAYS Performed at [facility], [address], [place], [place] [address]    Report Status PENDING  Incomplete  Blood Culture (routine x 2)     Status: None (Preliminary result)   Collection Time: 08/17/18 12:54 AM   Result Value Ref Range Status   Specimen Description BLOOD LEFT ANTECUBITAL  Final   Special Requests   Final    BOTTLES DRAWN AEROBIC AND ANAEROBIC Blood Culture adequate volume   Culture   Final    NO GROWTH 2 DAYS Performed at [facility], [address] [place], [place] [address]    Report Status PENDING  Incomplete  Respiratory Panel by PCR     Status: None   Collection Time: 08/17/18  9:26 AM  Result Value Ref Range Status   Adenovirus NOT DETECTED NOT DETECTED Final   Coronavirus 229E NOT DETECTED NOT DETECTED Final   Coronavirus HKU1 NOT DETECTED NOT DETECTED Final   Coronavirus NL63 NOT DETECTED NOT DETECTED Final   Coronavirus OC43 NOT DETECTED NOT DETECTED Final   Metapneumovirus NOT DETECTED NOT DETECTED Final   Rhinovirus / Enterovirus NOT DETECTED NOT DETECTED Final   Influenza A NOT DETECTED NOT DETECTED Final   Influenza B NOT DETECTED NOT DETECTED Final   Parainfluenza Virus 1 NOT DETECTED NOT DETECTED Final   Parainfluenza Virus 2 NOT DETECTED NOT DETECTED Final   Parainfluenza Virus 3 NOT DETECTED NOT DETECTED Final   Parainfluenza Virus 4 NOT DETECTED NOT DETECTED Final   Respiratory Syncytial Virus NOT DETECTED NOT DETECTED Final   Bordetella pertussis NOT DETECTED NOT DETECTED Final   Chlamydophila pneumoniae NOT DETECTED NOT DETECTED Final   Mycoplasma pneumoniae NOT DETECTED NOT DETECTED Final    Comment: Performed at [facility], [place] [address] [place], [place] [address]  MRSA PCR Screening     Status: None   Collection Time: 08/17/18  9:26 AM  Result Value Ref Range Status   MRSA by PCR NEGATIVE NEGATIVE Final    Comment:        The GeneXpert MRSA Assay (FDA approved for NASAL specimens only), is one component of a comprehensive MRSA colonization surveillance program. It is not intended to diagnose MRSA infection nor to guide or monitor treatment for MRSA infections. Performed at [facility], [address],  [place], [place] [address]   Urine culture     Status: None   Collection Time: 08/17/18  4:35 PM  Result Value  Ref Range Status   Specimen Description   Final    URINE, RANDOM Performed at [facility], [address] [place], [place] [address]    Special Requests   Final    NONE Performed at [facility], [address], [place], [place] [address]    Culture   Final    NO GROWTH Performed at [facility], [place] [address], [place], [place] [address]    Report Status 08/18/2018 FINAL  Final    RADIOLOGY:  Dg Chest 2 View  Result Date: 08/19/2018 CLINICAL DATA:  Chest pain and shortness of breath and pneumonia. Current smoker. EXAM: CHEST - 2 VIEW COMPARISON:  Chest x-ray of August 16, 2018 FINDINGS: The lungs are better inflated today. At airspace opacity at the right lung base persists. There is a small right pleural effusion. The interstitial markings are coarse. Subtle nodularity is present in the left upper lobe. The heart is mildly enlarged. The pulmonary vascularity is engorged. IMPRESSION: Right basilar atelectasis or pneumonia with small right pleural effusion. Patchy nodular density in the left upper lobe more conspicuous today may reflect developing pneumonia or atelectasis. Mild CHF. High Electronically Signed   By: David  [name] M.D.   On: 08/19/2018 09:36     Management plans discussed with the patient, family and they are in agreement.  CODE STATUS: Prior   TOTAL TIME TAKING CARE OF THIS PATIENT: 45 minutes.    Berna Spare Mayo M.D on 08/19/2018 at 6:51 PM  Between 7am to 6pm - Pager - [phone]  After 6pm go to [email or web address] - Proofreader  [facility]  Office  [phone]  CC: Primary care physician; Patient, No Pcp Per   Note: This dictation was prepared with Dragon dictation along with smaller phrase technology. Any transcriptional errors that result from this process are unintentional.

## 2018-08-19 NOTE — Progress Notes (Signed)
SUBJECTIVE:has right sided chest pain   Vitals:   08/18/18 1943 08/18/18 1946 08/19/18 0532 08/19/18 0752  BP: (!) 163/85  (!) 148/75 (!) 142/76  Pulse: 85  68 75  Resp:    (!) 22  Temp:   98.6 F (37 C) 99.2 F (37.3 C)  TempSrc:   Oral Oral  SpO2: 95% 95% 93% (!) 88%  Weight:      Height:        Intake/Output Summary (Last 24 hours) at 08/19/2018 0845 Last data filed at 08/18/2018 2212 Gross per 24 hour  Intake 240 ml  Output 0 ml  Net 240 ml    LABS: Basic Metabolic Panel: Recent Labs    08/17/18 0214 08/18/18 0401 08/19/18 0508  NA  --  139 142  K  --  4.5 3.9  CL  --  103 105  CO2  --  29 28  GLUCOSE  --  154* 115*  BUN  --  14 15  CREATININE  --  0.83 0.73  CALCIUM  --  8.8* 8.6*  MG 2.0  --   --   PHOS 2.4*  --   --    Liver Function Tests: Recent Labs    08/17/18 0214  AST 25  ALT 30  ALKPHOS 55  BILITOT 0.8  PROT 7.2  ALBUMIN 3.4*   No results for input(s): LIPASE, AMYLASE in the last 72 hours. CBC: Recent Labs    08/16/18 2241 08/18/18 0401 08/19/18 0508  WBC 9.7 14.9* 11.4*  NEUTROABS 7.6*  --   --   HGB 13.9 12.0* 12.1*  HCT 39.5* 35.5* 34.5*  MCV 100.1* 100.5* 99.6  PLT 331 324 322   Cardiac Enzymes: Recent Labs    08/16/18 2241 08/17/18 0214 08/17/18 0517  CKTOTAL  --  109  --   TROPONINI <0.03 <0.03 <0.03   BNP: Invalid input(s): POCBNP D-Dimer: No results for input(s): DDIMER in the last 72 hours. Hemoglobin A1C: Recent Labs    08/18/18 0401  HGBA1C 6.3*   Fasting Lipid Panel: No results for input(s): CHOL, HDL, LDLCALC, TRIG, CHOLHDL, LDLDIRECT in the last 72 hours. Thyroid Function Tests: No results for input(s): TSH, T4TOTAL, T3FREE, THYROIDAB in the last 72 hours.  Invalid input(s): FREET3 Anemia Panel: Recent Labs    08/17/18 0517  VITAMINB12 209  FOLATE 6.4     PHYSICAL EXAM General: Well developed, well nourished, in no acute distress HEENT:  Normocephalic and atramatic Neck:  No JVD.  Lungs:  Clear bilaterally to auscultation and percussion. Heart: HRRR . Normal S1 and S2 without gallops or murmurs.  Abdomen: Bowel sounds are positive, abdomen soft and non-tender  Msk:  Back normal, normal gait. Normal strength and tone for age. Extremities: No clubbing, cyanosis or edema.   Neuro: Alert and oriented X 3. Psych:  Good affect, responds appropriately  TELEMETRY: NSR  ASSESSMENT AND PLAN: Pneumonia, with chest pain on rigt side pleuritic in nature. May do w/o fro abnormal EKG out patient.  Active Problems:   Right-sided chest pain    Gloriana Piltz A, MD, [facility] 08/19/2018 8:45 AM

## 2018-08-19 NOTE — Care Management (Addendum)
Post discharge: patient took [facility] with prescriptions to [place] [phone] as directed. Pharmacy has called CM stating that Dr. Brett Albino is not in their system to fill the medications. Spoke with Missy at [facility] -  Will need to have Dr. Brett Albino added to program. [facility] has reached out to Pharmacist at [facility] involved with [facility] services for assistance. It will be Monday before [facility] can enter Dr. Brett Albino in the system with [facility]. [facility] outpatient pharmacy cannot assist with [place]. RNCM spoke again with [facility] again. The only other option is for patient to take Rx to Outpatient Pharmacy at [facility] [address], [place], [place] [address] [phone]. OR another [facility] re-write Rx for patient. Message sent to Dr. Manuella Ghazi. Rx requested from Dr. Jerelyn Charles, Dr. Vianne Bulls, or Dr. Manuella Ghazi for assistance via [place]. Dr. Vianne Bulls called Rx in to [facility] for patient.

## 2018-08-19 NOTE — Progress Notes (Signed)
Dc instructions and rx given.  Went over f/u appt, meds and when to take them.  Pt & wife verbalized understanding. Dc telemetry.

## 2018-08-22 ENCOUNTER — Telehealth: Payer: Self-pay

## 2018-08-22 LAB — CRYOGLOBULIN

## 2018-08-22 LAB — CULTURE, BLOOD (ROUTINE X 2)
Culture: NO GROWTH
Culture: NO GROWTH
Special Requests: ADEQUATE

## 2018-08-22 NOTE — Telephone Encounter (Signed)
Flagged on EMMI report for not knowing who to contact about changes in condition and having other questions or issues.  Called and spoke with patient. He mentioned he was having some anxiety over not breathing well at night and wanted to know if his condition would get worse before getting better.  Encouraged him to call [facility] to speak with Dr. Laurelyn Sickle nurse for guidance on what to expect. He also wanted the number for [facility] as he may reach out to them as well to see if he can speak with the nurse that discharged him.  Number for 2A nursing unit given.  Reviewed follow up appointments with patient and he is aware of them.  No further questions or concerns at this time.  I thanked him for his time and informed him that he would receive one more automated call checking on him in the next few days.

## 2018-08-22 NOTE — Telephone Encounter (Signed)
Gave call to tat to move his appt for early opening

## 2018-08-22 NOTE — Telephone Encounter (Signed)
Pt called that he just discharged from hospital for pneumonia and he is new pt for [place] appt on 9/23 and he is had difficulty breathing advised pt go to Er and also gave front desk to move appt for early opening

## 2018-08-28 ENCOUNTER — Encounter: Payer: Self-pay | Admitting: Emergency Medicine

## 2018-08-28 ENCOUNTER — Inpatient Hospital Stay: Payer: Self-pay

## 2018-08-28 ENCOUNTER — Emergency Department: Payer: Self-pay

## 2018-08-28 ENCOUNTER — Inpatient Hospital Stay
Admission: EM | Admit: 2018-08-28 | Discharge: 2018-09-01 | DRG: 177 | Disposition: A | Discharge status: Other Acute Inpatient Hospital | Payer: MEDICAID | Attending: Internal Medicine | Admitting: Internal Medicine

## 2018-08-28 ENCOUNTER — Other Ambulatory Visit: Payer: Self-pay

## 2018-08-28 DIAGNOSIS — Z9889 Other specified postprocedural states: Secondary | ICD-10-CM

## 2018-08-28 DIAGNOSIS — J918 Pleural effusion in other conditions classified elsewhere: Secondary | ICD-10-CM | POA: Diagnosis present

## 2018-08-28 DIAGNOSIS — J9601 Acute respiratory failure with hypoxia: Secondary | ICD-10-CM | POA: Diagnosis present

## 2018-08-28 DIAGNOSIS — J869 Pyothorax without fistula: Principal | ICD-10-CM | POA: Diagnosis present

## 2018-08-28 DIAGNOSIS — J90 Pleural effusion, not elsewhere classified: Secondary | ICD-10-CM

## 2018-08-28 DIAGNOSIS — F17210 Nicotine dependence, cigarettes, uncomplicated: Secondary | ICD-10-CM | POA: Diagnosis present

## 2018-08-28 DIAGNOSIS — R0902 Hypoxemia: Secondary | ICD-10-CM

## 2018-08-28 DIAGNOSIS — Z87442 Personal history of urinary calculi: Secondary | ICD-10-CM

## 2018-08-28 DIAGNOSIS — I10 Essential (primary) hypertension: Secondary | ICD-10-CM | POA: Diagnosis present

## 2018-08-28 DIAGNOSIS — Z79899 Other long term (current) drug therapy: Secondary | ICD-10-CM

## 2018-08-28 LAB — CBC WITH DIFFERENTIAL/PLATELET
Basophils Absolute: 0.1 10*3/uL (ref 0–0.1)
Basophils Relative: 1 %
Eosinophils Absolute: 0.2 10*3/uL (ref 0–0.7)
Eosinophils Relative: 2 %
HCT: 35.4 % — ABNORMAL LOW (ref 40.0–52.0)
Hemoglobin: 12.5 g/dL — ABNORMAL LOW (ref 13.0–18.0)
LYMPHS ABS: 1.6 10*3/uL (ref 1.0–3.6)
LYMPHS PCT: 14 %
MCH: 34.9 pg — AB (ref 26.0–34.0)
MCHC: 35.4 g/dL (ref 32.0–36.0)
MCV: 98.8 fL (ref 80.0–100.0)
Monocytes Absolute: 1.6 10*3/uL — ABNORMAL HIGH (ref 0.2–1.0)
Monocytes Relative: 13 %
NEUTROS ABS: 8.3 10*3/uL — AB (ref 1.4–6.5)
NEUTROS PCT: 70 %
PLATELETS: 482 10*3/uL — AB (ref 150–440)
RBC: 3.58 MIL/uL — ABNORMAL LOW (ref 4.40–5.90)
RDW: 13.6 % (ref 11.5–14.5)
WBC: 11.7 10*3/uL — AB (ref 3.8–10.6)

## 2018-08-28 LAB — BODY FLUID CELL COUNT WITH DIFFERENTIAL
Eos, Fluid: 1 %
LYMPHS FL: 28 %
MONOCYTE-MACROPHAGE-SEROUS FLUID: 11 %
Neutrophil Count, Fluid: 60 %
Other Cells, Fluid: 0 %
WBC FLUID: 900 uL

## 2018-08-28 LAB — COMPREHENSIVE METABOLIC PANEL
ALT: 226 U/L — ABNORMAL HIGH (ref 0–44)
ANION GAP: 7 (ref 5–15)
AST: 80 U/L — AB (ref 15–41)
Albumin: 2.5 g/dL — ABNORMAL LOW (ref 3.5–5.0)
Alkaline Phosphatase: 125 U/L (ref 38–126)
BILIRUBIN TOTAL: 0.8 mg/dL (ref 0.3–1.2)
BUN: 12 mg/dL (ref 6–20)
CHLORIDE: 100 mmol/L (ref 98–111)
CO2: 31 mmol/L (ref 22–32)
Calcium: 8.4 mg/dL — ABNORMAL LOW (ref 8.9–10.3)
Creatinine, Ser: 0.81 mg/dL (ref 0.61–1.24)
Glucose, Bld: 124 mg/dL — ABNORMAL HIGH (ref 70–99)
POTASSIUM: 3.7 mmol/L (ref 3.5–5.1)
Sodium: 138 mmol/L (ref 135–145)
Total Protein: 7.3 g/dL (ref 6.5–8.1)

## 2018-08-28 LAB — LACTIC ACID, PLASMA: LACTIC ACID, VENOUS: 0.8 mmol/L (ref 0.5–1.9)

## 2018-08-28 LAB — GLUCOSE, PLEURAL OR PERITONEAL FLUID: GLUCOSE FL: 84 mg/dL

## 2018-08-28 LAB — PROTEIN, PLEURAL OR PERITONEAL FLUID: Total protein, fluid: 4.6 g/dL

## 2018-08-28 LAB — TROPONIN I

## 2018-08-28 LAB — ALBUMIN, PLEURAL OR PERITONEAL FLUID: ALBUMIN FL: 2.2 g/dL

## 2018-08-28 LAB — BRAIN NATRIURETIC PEPTIDE: B NATRIURETIC PEPTIDE 5: 52 pg/mL (ref 0.0–100.0)

## 2018-08-28 MED ORDER — SODIUM CHLORIDE 0.9 % IV BOLUS
1000.0000 mL | Freq: Once | INTRAVENOUS | Status: AC
  Administered 2018-08-28: 1000 mL via INTRAVENOUS

## 2018-08-28 MED ORDER — ONDANSETRON HCL 4 MG/2ML IJ SOLN
4.0000 mg | Freq: Once | INTRAMUSCULAR | Status: AC
  Administered 2018-08-28: 4 mg via INTRAVENOUS
  Filled 2018-08-28: qty 2

## 2018-08-28 MED ORDER — MORPHINE SULFATE (PF) 2 MG/ML IV SOLN
2.0000 mg | INTRAVENOUS | Status: DC | PRN
  Administered 2018-08-28 (×2): 2 mg via INTRAVENOUS
  Administered 2018-08-28: 4 mg via INTRAVENOUS
  Administered 2018-08-28 – 2018-08-29 (×3): 2 mg via INTRAVENOUS
  Filled 2018-08-28 (×5): qty 1

## 2018-08-28 MED ORDER — MORPHINE SULFATE (PF) 2 MG/ML IV SOLN
INTRAVENOUS | Status: AC
  Administered 2018-08-28: 4 mg via INTRAVENOUS
  Filled 2018-08-28: qty 2

## 2018-08-28 MED ORDER — MORPHINE SULFATE (PF) 4 MG/ML IV SOLN: 4.0000 mg | Freq: Once | INTRAVENOUS | Status: DC

## 2018-08-28 MED ORDER — HYDRALAZINE HCL 20 MG/ML IJ SOLN: 10.0000 mg | Freq: Four times a day (QID) | INTRAMUSCULAR | Status: DC | PRN

## 2018-08-28 MED ORDER — IPRATROPIUM-ALBUTEROL 0.5-2.5 (3) MG/3ML IN SOLN
3.0000 mL | Freq: Once | RESPIRATORY_TRACT | Status: AC
  Administered 2018-08-28: 3 mL via RESPIRATORY_TRACT

## 2018-08-28 MED ORDER — ENOXAPARIN SODIUM 40 MG/0.4ML ~~LOC~~ SOLN
40.0000 mg | SUBCUTANEOUS | Status: DC
  Administered 2018-08-28: 20:00:00 40 mg via SUBCUTANEOUS
  Filled 2018-08-28: qty 0.4

## 2018-08-28 MED ORDER — ACETAMINOPHEN 650 MG RE SUPP: 650.0000 mg | Freq: Four times a day (QID) | RECTAL | Status: DC | PRN

## 2018-08-28 MED ORDER — POLYETHYLENE GLYCOL 3350 17 G PO PACK
17.0000 g | PACK | Freq: Every day | ORAL | Status: DC | PRN
  Administered 2018-08-31: 16:00:00 17 g via ORAL
  Filled 2018-08-28: qty 1

## 2018-08-28 MED ORDER — ONDANSETRON HCL 4 MG/2ML IJ SOLN: 4.0000 mg | Freq: Four times a day (QID) | INTRAMUSCULAR | Status: DC | PRN

## 2018-08-28 MED ORDER — FLEET ENEMA 7-19 GM/118ML RE ENEM: 1.0000 | ENEMA | Freq: Once | RECTAL | Status: DC | PRN

## 2018-08-28 MED ORDER — ONDANSETRON HCL 4 MG PO TABS: 4.0000 mg | ORAL_TABLET | Freq: Four times a day (QID) | ORAL | Status: DC | PRN

## 2018-08-28 MED ORDER — ACETAMINOPHEN 325 MG PO TABS
650.0000 mg | ORAL_TABLET | Freq: Four times a day (QID) | ORAL | Status: DC | PRN
  Administered 2018-08-29: 20:00:00 650 mg via ORAL
  Filled 2018-08-28: qty 2

## 2018-08-28 MED ORDER — IPRATROPIUM-ALBUTEROL 0.5-2.5 (3) MG/3ML IN SOLN
RESPIRATORY_TRACT | Status: AC
  Filled 2018-08-28: qty 9

## 2018-08-28 MED ORDER — LISINOPRIL 5 MG PO TABS
5.0000 mg | ORAL_TABLET | Freq: Every day | ORAL | Status: DC
  Administered 2018-08-28 – 2018-09-01 (×5): 5 mg via ORAL
  Filled 2018-08-28 (×6): qty 1

## 2018-08-28 MED ORDER — GUAIFENESIN 100 MG/5ML PO LIQD
200.0000 mg | Freq: Three times a day (TID) | ORAL | Status: DC | PRN
  Administered 2018-08-28 – 2018-08-29 (×2): 200 mg via ORAL
  Filled 2018-08-28 (×7): qty 10

## 2018-08-28 MED ORDER — ALBUTEROL SULFATE (2.5 MG/3ML) 0.083% IN NEBU: 2.5000 mg | INHALATION_SOLUTION | RESPIRATORY_TRACT | Status: DC | PRN

## 2018-08-28 MED ORDER — TRAMADOL HCL 50 MG PO TABS
50.0000 mg | ORAL_TABLET | Freq: Four times a day (QID) | ORAL | Status: DC | PRN
  Administered 2018-08-28 – 2018-08-31 (×9): 50 mg via ORAL
  Filled 2018-08-28 (×10): qty 1

## 2018-08-28 NOTE — ED Triage Notes (Signed)
Reports diagnosed recently with pneumonia.  Reports continued short of breath and cough.

## 2018-08-28 NOTE — H&P (Signed)
[place] [facility] [place] NAME: Roy [name]    MR#:  [id number]  DATE OF BIRTH:  1971-09-23  DATE OF ADMISSION:  08/28/2018  PRIMARY CARE PHYSICIAN: Patient, No Pcp Per   REQUESTING/REFERRING PHYSICIAN: dr Cherylann Banas  CHIEF COMPLAINT:  Pt present shortness of breath for 2 to 3 days  HISTORY OF PRESENT ILLNESS:  Roy [name]  is a 47 y.o. male with a known history of hypertension, who was recently discharged 96 2019 after diagnosed with acute hypoxic respiratory failure secondary to multifocal pneumonia. Patient was discharged on azithromycin and Cefdinir with prednisone which he completed the course. He came in today with increasing shortness of breath found to have large right-sided pleural effusion. denies any fever. Complains of some chest pain with cough. Denies any productive phlegm. Patient is being admitted for further evaluation of management.   PAST MEDICAL HISTORY:   Past Medical History:  Diagnosis Date  . Aneurysm ([place])   . Cardiomyopathy ([place])    pt sts he no longer has it  . Hypertension   . Renal calculi     PAST SURGICAL HISTOIRY:   Past Surgical History:  Procedure Laterality Date  . ANEURYSM COILING     "a few years ago"    SOCIAL HISTORY:   Social History   Tobacco Use  . Smoking status: Current Every Day Smoker    Types: Cigarettes  . Smokeless tobacco: Never Used  Substance Use Topics  . Alcohol use: Not on file    FAMILY HISTORY:  No family history on file.  DRUG ALLERGIES:  No Known Allergies  REVIEW OF SYSTEMS:  Review of Systems  Constitutional: Negative for chills, fever and weight loss.  HENT: Negative for ear discharge, ear pain and nosebleeds.   Eyes: Negative for blurred vision, pain and discharge.  Respiratory: Positive for shortness of breath. Negative for sputum production, wheezing and stridor.   Cardiovascular: Positive for chest pain. Negative for palpitations,  orthopnea and PND.  Gastrointestinal: Negative for abdominal pain, diarrhea, nausea and vomiting.  Genitourinary: Negative for frequency and urgency.  Musculoskeletal: Negative for back pain and joint pain.  Neurological: Negative for sensory change, speech change, focal weakness and weakness.  Psychiatric/Behavioral: Negative for depression and hallucinations. The patient is not nervous/anxious.      MEDICATIONS AT HOME:   Prior to Admission medications   Medication Sig Start Date End Date Taking? Authorizing Provider  guaiFENesin (ROBITUSSIN) 100 MG/5ML liquid Take 200 mg by mouth 3 (three) times daily as needed for cough or congestion.   Yes [provider]  ibuprofen (ADVIL,MOTRIN) 800 MG tablet Take 1 tablet (800 mg total) by mouth every 6 (six) hours as needed for mild pain or moderate pain. 08/19/18  Yes Mayo, Pete Pelt, MD  lisinopril (PRINIVIL,ZESTRIL) 5 MG tablet Take 1 tablet (5 mg total) by mouth daily. 08/20/18  Yes Mayo, Pete Pelt, MD      VITAL SIGNS:  Blood pressure 140/81, pulse 66, temperature 98.4 F (36.9 C), temperature source Oral, resp. rate 18, height 6\' 3"  (1.905 m), weight 110.9 kg, SpO2 97 %.  PHYSICAL EXAMINATION:  GENERAL:  47 y.o.-year-old patient lying in the bed with no acute distress.  EYES: Pupils equal, round, reactive to light and accommodation. No scleral icterus. Extraocular muscles intact.  HEENT: Head atraumatic, normocephalic. Oropharynx and nasopharynx clear.  NECK:  Supple, no jugular venous distention. No thyroid enlargement, no tenderness.  LUNGS: decreasedbreath sounds bilaterally right> left, no  wheezing, rales,rhonchi or crepitation. No use of accessory muscles of respiration.  CARDIOVASCULAR: S1, S2 normal. No murmurs, rubs, or gallops.  ABDOMEN: Soft, nontender, nondistended. Bowel sounds present. No organomegaly or mass.  EXTREMITIES: No pedal edema, cyanosis, or clubbing.  NEUROLOGIC: Cranial nerves II through XII are intact.  Muscle strength 5/5 in all extremities. Sensation intact. Gait not checked.  PSYCHIATRIC: The patient is alert and oriented x 3.  SKIN: No obvious rash, lesion, or ulcer.   LABORATORY PANEL:   CBC Recent Labs  Lab 08/28/18 0653  WBC 11.7*  HGB 12.5*  HCT 35.4*  PLT 482*   ------------------------------------------------------------------------------------------------------------------  Chemistries  Recent Labs  Lab 08/28/18 0803  NA 138  K 3.7  CL 100  CO2 31  GLUCOSE 124*  BUN 12  CREATININE 0.81  CALCIUM 8.4*  AST 80*  ALT 226*  ALKPHOS 125  BILITOT 0.8   ------------------------------------------------------------------------------------------------------------------  Cardiac Enzymes Recent Labs  Lab 08/28/18 0803  TROPONINI <0.03   ------------------------------------------------------------------------------------------------------------------  RADIOLOGY:  Dg Chest 2 View  Result Date: 08/28/2018 CLINICAL DATA:  Shortness of breath EXAM: CHEST - 2 VIEW COMPARISON:  08/19/2018 FINDINGS: Large right pleural effusion that has significantly increased from prior. Left chest is radiographically clear. There were nodular densities on prior chest CT. Normal heart size. IMPRESSION: Large right pleural effusion with significant increase from 08/19/2018. Electronically Signed   By: Monte Fantasia M.D.   On: 08/28/2018 08:00    EKG:    IMPRESSION AND PLAN:   Roy [name]  is a 47 y.o. male with a known history of hypertension, who was recently discharged 96 2019 after diagnosed with acute hypoxic respiratory failure secondary to multifocal pneumonia. Patient was discharged on azithromycin and Cefdinir with prednisone which he completed the course. He came in today with increasing shortness of breath found to have large right-sided pleural effusion.  1. right-sided large pleural effusion -admit to medical floor -etiology remains unclear. Will get  ultrasound-guided thoracentesis and send fluid for lab test -recently treated for multifocal pneumonia just completed a course of antibiotic along with steroids -CT chest done during last admission in August 16, 2018 did not show any evidence of mass -pulmonary consultation with Dr. Yancey Flemings tomorrow. He was seen by Dr. Yancey Flemings last admission -no indication for antibiotic at present  2 hypertension  -continue lisinopril  3. Tobacco abuse patient states he is quit smoking for past week  4. DVT prophylaxis subcu Lovenox  Discussed with patient and girlfriend  All the records are reviewed and case discussed with ED provider. Management plans discussed with the patient, family and they are in agreement.  CODE STATUS: full  TOTAL TIME TAKING CARE OF THIS PATIENT: 50 minutes.    Fritzi Mandes M.D on 08/28/2018 at 1:45 PM  Between 7am to 6pm - Pager - [phone]  After 6pm go to [email or web address] - password [email or web address] [facility]  Office  [phone]  CC: Primary care physician; Patient, No Pcp Per

## 2018-08-28 NOTE — ED Notes (Signed)
Patient transported to X-ray 

## 2018-08-28 NOTE — ED Notes (Signed)
Family at bedside. 

## 2018-08-28 NOTE — ED Notes (Signed)
ED Provider at bedside. 

## 2018-08-28 NOTE — ED Notes (Signed)
Attempted to trial patient off oxygen, decreased from 2 L to 1 L, pt was 88% at rest on 1 L [place].

## 2018-08-28 NOTE — ED Provider Notes (Signed)
[facility] Emergency Department Provider Note ____________________________________________   First MD Initiated Contact with Patient 08/28/18 [id number]     (approximate)  I have reviewed the triage vital signs and the nursing notes.   HISTORY  Chief Complaint Shortness of Breath and Cough    HPI Roy [name] is a 47 y.o. male with PMH as noted below who presents with shortness of breath, gradual onset, worsening course, worse with exertion, and associated with right-sided chest pain.  The patient was admitted earlier this month for pneumonia.  He states that he finished the medications he was sent home on except for the inhaler which he states he has had to use numerous times per day.  He reports some cough but denies fever or vomiting.  Past Medical History:  Diagnosis Date  . Aneurysm ([place])   . Cardiomyopathy ([place])    pt sts he no longer has it  . Hypertension   . Renal calculi     Patient Active Problem List   Diagnosis Date Noted  . Right-sided chest pain 08/17/2018    Past Surgical History:  Procedure Laterality Date  . ANEURYSM COILING     "a few years ago"    Prior to Admission medications   Medication Sig Start Date End Date Taking? Authorizing Provider  azithromycin (ZITHROMAX) 250 MG tablet Take 1 tablet (250 mg total) by mouth daily. Starting 9/7 08/19/18   Mayo, Pete Pelt, MD  cefdinir (OMNICEF) 300 MG capsule Take 1 capsule (300 mg total) by mouth 2 (two) times daily. Starting 9/7 08/19/18   Mayo, Pete Pelt, MD  ibuprofen (ADVIL,MOTRIN) 800 MG tablet Take 1 tablet (800 mg total) by mouth every 6 (six) hours as needed for mild pain or moderate pain. 08/19/18   Mayo, Pete Pelt, MD  lisinopril (PRINIVIL,ZESTRIL) 5 MG tablet Take 1 tablet (5 mg total) by mouth daily. 08/20/18   Mayo, Pete Pelt, MD  predniSONE (DELTASONE) 20 MG tablet Take 2 tablets (40 mg total) by mouth daily with breakfast. Starting 9/7 08/20/18   Mayo, Pete Pelt, MD  traMADol  (ULTRAM) 50 MG tablet Take 1 tablet (50 mg total) by mouth every 6 (six) hours as needed for severe pain. 08/19/18   Mayo, Pete Pelt, MD    Allergies Patient has no known allergies.  No family history on file.  Social History Social History   Tobacco Use  . Smoking status: Current Every Day Smoker    Types: Cigarettes  . Smokeless tobacco: Never Used  Substance Use Topics  . Alcohol use: Not on file  . Drug use: Never    Review of Systems  Constitutional: No fever. Eyes: No redness. ENT: No sore throat. Cardiovascular: Positive for chest pain. Respiratory: Positive for shortness of breath. Gastrointestinal: No vomiting.  Genitourinary: Negative for flank pain.  Musculoskeletal: Negative for back pain. Skin: Negative for rash. Neurological: Negative for headache.   ____________________________________________   PHYSICAL EXAM:  VITAL SIGNS: ED Triage Vitals  Enc Vitals Group     BP 08/28/18 0700 (!) 177/102     Pulse Rate 08/28/18 0700 77     Resp 08/28/18 0700 18     Temp 08/28/18 0700 98.9 F (37.2 C)     Temp Source 08/28/18 0700 Oral     SpO2 08/28/18 0700 99 %     Weight 08/28/18 0716 252 lb 4.8 oz (114.4 kg)     Height 08/28/18 0716 6\' 3"  (1.905 m)     Head  Circumference --      Peak Flow --      Pain Score 08/28/18 0638 7     Pain Loc --      Pain Edu? --      Excl. in [place]? --     Constitutional: Alert and oriented.  Uncomfortable appearing but in no acute distress. Eyes: Conjunctivae are normal.  Head: Atraumatic. Nose: No congestion/rhinnorhea. Mouth/Throat: Mucous membranes are slightly dry.   Neck: Normal range of motion.  Cardiovascular: Normal rate, regular rhythm. Grossly normal heart sounds.  Good peripheral circulation. Respiratory: Slightly increased respiratory effort.  No retractions.  Decreased breath sounds right lung. Gastrointestinal: Soft and nontender. No distention.  Genitourinary: No flank tenderness. Musculoskeletal: No lower  extremity edema.  Extremities warm and well perfused.  Neurologic:  Normal speech and language. No gross focal neurologic deficits are appreciated.  Skin:  Skin is warm and dry. No rash noted. Psychiatric: Anxious appearing.  Speech and behavior are normal.  ____________________________________________   LABS (all labs ordered are listed, but only abnormal results are displayed)  Labs Reviewed  CBC WITH DIFFERENTIAL/PLATELET - Abnormal; Notable for the following components:      Result Value   WBC 11.7 (*)    RBC 3.58 (*)    Hemoglobin 12.5 (*)    HCT 35.4 (*)    MCH 34.9 (*)    Platelets 482 (*)    Neutro Abs 8.3 (*)    Monocytes Absolute 1.6 (*)    All other components within normal limits  COMPREHENSIVE METABOLIC PANEL - Abnormal; Notable for the following components:   Glucose, Bld 124 (*)    Calcium 8.4 (*)    Albumin 2.5 (*)    AST 80 (*)    ALT 226 (*)    All other components within normal limits  LACTIC ACID, PLASMA  TROPONIN I  BRAIN NATRIURETIC PEPTIDE   ____________________________________________  EKG  ED ECG REPORT I, Arta Silence, the attending physician, personally viewed and interpreted this ECG.  Date: 08/28/2018 EKG Time: 0642 Rate: 72 Rhythm: normal sinus rhythm QRS Axis: normal Intervals: normal ST/T Wave abnormalities: normal Narrative Interpretation: no evidence of acute ischemia  ____________________________________________  RADIOLOGY  CXR: Large pleural effusion right lung  ____________________________________________   PROCEDURES  Procedure(s) performed: No  Procedures  Critical Care performed: No ____________________________________________   INITIAL IMPRESSION / ASSESSMENT AND PLAN / ED COURSE  Pertinent labs & imaging results that were available during my care of the patient were reviewed by me and considered in my medical decision making (see chart for details).  47 year old male with PMH as noted above and  recent admission for pneumonia presents with persistent and worsening shortness of breath and right-sided chest pain since he was discharged.  The patient states he has had a few days where he is felt almost back to his baseline, but then the symptoms return.  He states he finished the antibiotics and steroid, but has continued to need the inhaler frequently.  I reviewed the past medical records in Epic; the patient was admitted earlier this month and discharged on 08/19/2018.  He had a CT chest which was negative for PE, but with bilateral patchy nodular opacities and small right pleural effusion which was too small to be drained.  The patient was treated for presumed diagnosis of pneumonia.  On exam, the patient is uncomfortable appearing, anxious and intermittently tearful.  His O2 saturation is in the low 90s on 2 L but drops to the  high 80s when he is placed on 1 L.  He is hypertensive but his other vital signs are normal.  He has decreased breath sounds on the right.  The remainder of the exam is as described above.  Differential includes persistent or worsening pneumonia, worsening effusion, malignancy, or less likely PE or cardiac etiology.  We will obtain chest x-ray, basic and cardiac labs, give nebs, analgesia, and reassess.  ----------------------------------------- 8:57 AM on 08/28/2018 -----------------------------------------  X-ray shows worsening large right pleural effusion which explains the patient's hypoxia although the etiology of the effusion is unclear.  There is no evidence of sepsis or active pneumonia.  The patient will require admission for further work-up.  I signed the patient out to the hospitalist Dr. Posey Pronto.  ____________________________________________   FINAL CLINICAL IMPRESSION(S) / ED DIAGNOSES  Final diagnoses:  Pleural effusion  Hypoxia      NEW MEDICATIONS STARTED DURING THIS VISIT:  New Prescriptions   No medications on file     Note:  This  document was prepared using Dragon voice recognition software and may include unintentional dictation errors.    Arta Silence, MD 08/28/18 [phone]

## 2018-08-29 ENCOUNTER — Ambulatory Visit: Payer: Self-pay | Admitting: Internal Medicine

## 2018-08-29 MED ORDER — MORPHINE SULFATE (PF) 2 MG/ML IV SOLN
2.0000 mg | INTRAVENOUS | Status: DC | PRN
  Administered 2018-08-29: 20:00:00 4 mg via INTRAVENOUS
  Administered 2018-08-29: 2 mg via INTRAVENOUS
  Administered 2018-08-29: 08:00:00 3 mg via INTRAVENOUS
  Administered 2018-08-29 (×3): 4 mg via INTRAVENOUS
  Administered 2018-08-29: 2 mg via INTRAVENOUS
  Administered 2018-08-30 – 2018-09-01 (×14): 4 mg via INTRAVENOUS
  Administered 2018-09-01 (×4): 2 mg via INTRAVENOUS
  Filled 2018-08-29 (×18): qty 2
  Filled 2018-08-29 (×2): qty 1
  Filled 2018-08-29 (×3): qty 2

## 2018-08-29 MED ORDER — ENOXAPARIN SODIUM 40 MG/0.4ML ~~LOC~~ SOLN
40.0000 mg | SUBCUTANEOUS | Status: DC
  Administered 2018-08-29: 40 mg via SUBCUTANEOUS
  Filled 2018-08-29: qty 0.4

## 2018-08-29 MED ORDER — CLONAZEPAM 0.5 MG PO TABS
0.2500 mg | ORAL_TABLET | Freq: Every day | ORAL | Status: DC
  Administered 2018-08-29 – 2018-08-31 (×3): 0.25 mg via ORAL
  Filled 2018-08-29 (×2): qty 1

## 2018-08-29 NOTE — Progress Notes (Signed)
[facility] [place] NAME: Roy [name]    MR#:  [id number]  DATE OF BIRTH:  1971/11/20  SUBJECTIVE:  patient feels a lot better after fluid was removed. Complains of right back pain. No fever mild cough no phlegm.   REVIEW OF SYSTEMS:   Review of Systems  Constitutional: Negative for chills, fever and weight loss.  HENT: Negative for ear discharge, ear pain and nosebleeds.   Eyes: Negative for blurred vision, pain and discharge.  Respiratory: Positive for cough and shortness of breath. Negative for sputum production, wheezing and stridor.   Cardiovascular: Negative for chest pain, palpitations, orthopnea and PND.  Gastrointestinal: Negative for abdominal pain, diarrhea, nausea and vomiting.  Genitourinary: Negative for frequency and urgency.  Musculoskeletal: Positive for back pain. Negative for joint pain.  Neurological: Negative for sensory change, speech change, focal weakness and weakness.  Psychiatric/Behavioral: Negative for depression and hallucinations. The patient is not nervous/anxious.    Tolerating Diet: yes Tolerating PT: ambulatory  DRUG ALLERGIES:  No Known Allergies  VITALS:  Blood pressure 130/68, pulse 60, temperature 98.9 F (37.2 C), resp. rate 18, height 6\' 3"  (1.905 m), weight 110.9 kg, SpO2 93 %.  PHYSICAL EXAMINATION:   Physical Exam  GENERAL:  47 y.o.-year-old patient lying in the bed with no acute distress.  EYES: Pupils equal, round, reactive to light and accommodation. No scleral icterus. Extraocular muscles intact.  HEENT: Head atraumatic, normocephalic. Oropharynx and nasopharynx clear.  NECK:  Supple, no jugular venous distention. No thyroid enlargement, no tenderness.  LUNGS: Normal breath sounds bilaterally, no wheezing, rales, rhonchi. No use of accessory muscles of respiration. Decrease breath sounds on the right CARDIOVASCULAR: S1, S2 normal. No murmurs, rubs, or gallops.  ABDOMEN:  Soft, nontender, nondistended. Bowel sounds present. No organomegaly or mass.  EXTREMITIES: No cyanosis, clubbing or edema b/l.    NEUROLOGIC: Cranial nerves II through XII are intact. No focal Motor or sensory deficits b/l.   PSYCHIATRIC:  patient is alert and oriented x 3.  SKIN: No obvious rash, lesion, or ulcer.   LABORATORY PANEL:  CBC Recent Labs  Lab 08/28/18 0653  WBC 11.7*  HGB 12.5*  HCT 35.4*  PLT 482*    Chemistries  Recent Labs  Lab 08/28/18 0803  NA 138  K 3.7  CL 100  CO2 31  GLUCOSE 124*  BUN 12  CREATININE 0.81  CALCIUM 8.4*  AST 80*  ALT 226*  ALKPHOS 125  BILITOT 0.8   Cardiac Enzymes Recent Labs  Lab 08/28/18 0803  TROPONINI <0.03   RADIOLOGY:  Dg Chest 2 View  Result Date: 08/28/2018 CLINICAL DATA:  Shortness of breath EXAM: CHEST - 2 VIEW COMPARISON:  08/19/2018 FINDINGS: Large right pleural effusion that has significantly increased from prior. Left chest is radiographically clear. There were nodular densities on prior chest CT. Normal heart size. IMPRESSION: Large right pleural effusion with significant increase from 08/19/2018. Electronically Signed   By: Monte Fantasia M.D.   On: 08/28/2018 08:00   Dg Chest Port 1 View  Result Date: 08/28/2018 CLINICAL DATA:  Post right-sided thoracentesis. EXAM: PORTABLE CHEST 1 VIEW COMPARISON:  Chest radiograph - earlier same day FINDINGS: Slight reduction in persistent moderate-to-large potentially partially loculated right-sided effusion post thoracentesis. No pneumothorax. Minimally improved aeration of the right lung with persistent right mid and lower lung consolidative opacities. Grossly unchanged cardiac silhouette and mediastinal contours. The left hemithorax remains well aerated. The left-sided pleural effusion. No evidence  of edema. No acute osseus abnormalities. Stigmata of DISH within the thoracic spine. IMPRESSION: Slight reduction in persistent moderate to large sized right-sided effusion post  large volume right-sided thoracentesis. No pneumothorax. Electronically Signed   By: Sandi Mariscal M.D.   On: 08/28/2018 19:29   US Thoracentesis Asp Pleural Space W/img Guide  Result Date: 08/28/2018 INDICATION: Symptomatic right-sided pleural effusion. Please from ultrasound-guided thoracentesis for diagnostic and therapeutic purposes. EXAM: US THORACENTESIS ASP PLEURAL SPACE W/IMG GUIDE COMPARISON:  Chest radiograph - 08/28/2018; 08/16/2018; chest CT - 08/16/2018 MEDICATIONS: None. COMPLICATIONS: None immediate. TECHNIQUE: Informed written consent was obtained from the patient after a discussion of the risks, benefits and alternatives to treatment. A timeout was performed prior to the initiation of the procedure. Initial ultrasound scanning demonstrates a large anechoic right-sided pleural effusion. The lower chest was prepped and draped in the usual sterile fashion. 1% lidocaine was used for local anesthesia. An ultrasound image was saved for documentation purposes. An 8 Fr Safe-T-Centesis catheter was introduced. The thoracentesis was performed. The catheter was removed and a dressing was applied. The patient tolerated the procedure well without immediate post procedural complication. The patient was escorted to have an upright chest radiograph. FINDINGS: A total of approximately 1.5 liters of serous fluid was removed. Requested samples were sent to the laboratory. IMPRESSION: Successful ultrasound-guided right sided thoracentesis yielding 1.5 liters of pleural fluid. Electronically Signed   By: Sandi Mariscal M.D.   On: 08/28/2018 19:29   ASSESSMENT AND PLAN:  Roy [name]  is a 47 y.o. male with a known history of hypertension, who was recently discharged 96 2019 after diagnosed with acute hypoxic respiratory failure secondary to multifocal pneumonia. Patient was discharged on azithromycin and Cefdinir with prednisone which he completed the course. He came in today with increasing shortness of breath  found to have large right-sided pleural effusion.  1. right-sided large pleural effusion -etiology remains unclear ?synpneumonic -ultrasound-guided thoracentesis removed 1.5 L of fluid. Results pending.  -Elevated protein -gram stain negative -recently treated for multifocal pneumonia just completed a course of antibiotic along with steroids -afebrile. White count is 11.2. I will hold off on antibiotics till seen by pulmonary. -Spoke with Dr. Humphrey Rolls who will see patient. -CT chest done during last admission in August 16, 2018 did not show any evidence of solid mass-- were given history of smoking and weight being malignancy would be a differential as well. -no indication for antibiotic at present -?repeat CT chest, ?CT surgery Chest tube  2 hypertension  -continue lisinopril  3. Tobacco abuse patient states he is quit smoking for past week Pt has used Vaping before  4. DVT prophylaxis subcu Lovenox   Case discussed with Care Management/Social Worker. Management plans discussed with the patient, family and they are in agreement.  CODE STATUS: full  DVT Prophylaxis: lovenox  TOTAL TIME TAKING CARE OF THIS PATIENT: *35 minutes.  >50% time spent on counselling and coordination of care  POSSIBLE D/C IN *few DAYS, DEPENDING ON CLINICAL CONDITION.  Note: This dictation was prepared with Dragon dictation along with smaller phrase technology. Any transcriptional errors that result from this process are unintentional.  Fritzi Mandes M.D on 08/29/2018 at 1:55 PM  Between 7am to 6pm - Pager - [phone]  After 6pm go to [email or web address] - password [email or web address] [facility]  Office  [phone]  CC: Primary care physician; Patient, No Pcp PerPatient ID: Roy [name], male   DOB: 01-03-1971, 47 y.o.   MRN: [id number]

## 2018-08-30 ENCOUNTER — Inpatient Hospital Stay: Payer: Self-pay

## 2018-08-30 DIAGNOSIS — J9621 Acute and chronic respiratory failure with hypoxia: Secondary | ICD-10-CM

## 2018-08-30 DIAGNOSIS — J90 Pleural effusion, not elsewhere classified: Secondary | ICD-10-CM

## 2018-08-30 DIAGNOSIS — R0781 Pleurodynia: Secondary | ICD-10-CM

## 2018-08-30 DIAGNOSIS — J869 Pyothorax without fistula: Principal | ICD-10-CM

## 2018-08-30 DIAGNOSIS — Z789 Other specified health status: Secondary | ICD-10-CM

## 2018-08-30 LAB — CYTOLOGY - NON PAP

## 2018-08-30 LAB — CBC
HEMATOCRIT: 34.8 % — AB (ref 40.0–52.0)
HEMOGLOBIN: 11.9 g/dL — AB (ref 13.0–18.0)
MCH: 34.3 pg — AB (ref 26.0–34.0)
MCHC: 34.3 g/dL (ref 32.0–36.0)
MCV: 100 fL (ref 80.0–100.0)
Platelets: 463 10*3/uL — ABNORMAL HIGH (ref 150–440)
RBC: 3.48 MIL/uL — ABNORMAL LOW (ref 4.40–5.90)
RDW: 13.6 % (ref 11.5–14.5)
WBC: 9.6 10*3/uL (ref 3.8–10.6)

## 2018-08-30 LAB — LD, BODY FLUID (OTHER): LD, BODY FLUID: 410 IU/L

## 2018-08-30 LAB — PROCALCITONIN: Procalcitonin: 0.12 ng/mL

## 2018-08-30 MED ORDER — VANCOMYCIN HCL IN DEXTROSE 1-5 GM/200ML-% IV SOLN
1000.0000 mg | Freq: Three times a day (TID) | INTRAVENOUS | Status: DC
  Administered 2018-08-30 – 2018-08-31 (×4): 1000 mg via INTRAVENOUS
  Filled 2018-08-30 (×6): qty 200

## 2018-08-30 MED ORDER — MIDAZOLAM HCL 2 MG/2ML IJ SOLN
INTRAMUSCULAR | Status: AC | PRN
  Administered 2018-08-30 (×2): 1 mg via INTRAVENOUS

## 2018-08-30 MED ORDER — MORPHINE SULFATE (PF) 4 MG/ML IV SOLN
4.0000 mg | Freq: Once | INTRAVENOUS | Status: AC
  Administered 2018-08-30: 20:00:00 4 mg via INTRAVENOUS
  Filled 2018-08-30: qty 1

## 2018-08-30 MED ORDER — FENTANYL CITRATE (PF) 100 MCG/2ML IJ SOLN
INTRAMUSCULAR | Status: AC
  Filled 2018-08-30: qty 4

## 2018-08-30 MED ORDER — FENTANYL CITRATE (PF) 100 MCG/2ML IJ SOLN
INTRAMUSCULAR | Status: AC | PRN
  Administered 2018-08-30 (×2): 50 ug via INTRAVENOUS

## 2018-08-30 MED ORDER — VANCOMYCIN HCL 10 G IV SOLR
1500.0000 mg | Freq: Once | INTRAVENOUS | Status: AC
  Administered 2018-08-30: 15:00:00 1500 mg via INTRAVENOUS
  Filled 2018-08-30: qty 1500

## 2018-08-30 MED ORDER — SODIUM CHLORIDE 0.9 % IV SOLN: 2.0000 g | Freq: Two times a day (BID) | INTRAVENOUS | Status: DC

## 2018-08-30 MED ORDER — SODIUM CHLORIDE 0.9 % IV SOLN
2.0000 g | Freq: Three times a day (TID) | INTRAVENOUS | Status: DC
  Administered 2018-08-30 – 2018-09-01 (×7): 2 g via INTRAVENOUS
  Filled 2018-08-30 (×10): qty 2

## 2018-08-30 MED ORDER — MIDAZOLAM HCL 5 MG/5ML IJ SOLN
INTRAMUSCULAR | Status: AC
  Filled 2018-08-30: qty 5

## 2018-08-30 MED ORDER — IOHEXOL 300 MG/ML  SOLN
75.0000 mL | Freq: Once | INTRAMUSCULAR | Status: AC | PRN
  Administered 2018-08-30: 08:00:00 75 mL via INTRAVENOUS

## 2018-08-30 NOTE — Procedures (Signed)
Interventional Radiology Procedure Note  Procedure: CT guided placement of right pleural thoracostomy tube  Complications: None  Estimated Blood Loss: < 10 mL  Findings: 12 Fr pigtail catheter placed in right pleural space with return of dark yellow, clear fluid.  Connected to [employer] device.  Venetia Night. Kathlene Cote, M.D Pager:  [phone]

## 2018-08-30 NOTE — Progress Notes (Signed)
Patient ID: Roy [name], male   DOB: 05-Oct-1971, 47 y.o.   MRN: [id number]  Chief Complaint  Patient presents with  . Shortness of Breath  . Cough    Referred By Dr. Wray Kearns Reason for Referral recurrent right pleural effusion  HPI Location, Quality, Duration, Severity, Timing, Context, Modifying Factors, Associated Signs and Symptoms.  Roy [name] is a 47 y.o. male.  He was admitted to our hospital about 2 weeks ago for community-acquired pneumonia.  He was treated inpatient with antibiotics and was discharged home where he completed an outpatient course of therapy.  He presented back to our emergency room with increasing shortness of breath and right-sided chest pain.  A chest x-ray showed a large right-sided pleural effusion and he ultimately underwent an ultrasound-guided thoracentesis.  A repeat CT scan showed a reaccumulation of the pleural effusion.  So far the cell count and cultures would suggest that this is a parapneumonic effusion but no signs of an empyema.  The patient states that he actually feels better since he was admitted to the hospital.  He did not walk yesterday but does not feel short of breath.  He has only an occasional cough which is nonproductive.  He did have some fevers overnight and some sweats.  He states that he does smoke at home and also vapes.  He works in [employer].  He is staying with his girlfriend and she is well without signs or symptoms of cough or pneumonia.   Past Medical History:  Diagnosis Date  . Aneurysm ([place])   . Cardiomyopathy ([place])    pt sts he no longer has it  . Hypertension   . Renal calculi     Past Surgical History:  Procedure Laterality Date  . ANEURYSM COILING     "a few years ago"    History reviewed. No pertinent family history.  Social History Social History   Tobacco Use  . Smoking status: Current Every Day Smoker    Types: Cigarettes  . Smokeless tobacco: Never Used  Substance Use Topics  .  Alcohol use: Not on file  . Drug use: Never    No Known Allergies  Current Facility-Administered Medications  Medication Dose Route Frequency Provider Last Rate Last Dose  . acetaminophen (TYLENOL) tablet 650 mg  650 mg Oral Q6H PRN Fritzi Mandes, MD   650 mg at 08/29/18 2010   Or  . acetaminophen (TYLENOL) suppository 650 mg  650 mg Rectal Q6H PRN Fritzi Mandes, MD      . albuterol (PROVENTIL) (2.5 MG/3ML) 0.083% nebulizer solution 2.5 mg  2.5 mg Nebulization Q4H PRN Fritzi Mandes, MD      . ceFEPIme (MAXIPIME) 2 g in sodium chloride 0.9 % 100 mL IVPB  2 g Intravenous Q8H Fritzi Mandes, MD      . clonazePAM Bobbye Charleston) tablet 0.25 mg  0.25 mg Oral QHS Loletha Grayer, MD   0.25 mg at 08/29/18 2230  . guaiFENesin (ROBITUSSIN) 100 MG/5ML liquid 200 mg  200 mg Oral TID PRN Fritzi Mandes, MD   200 mg at 08/29/18 2121  . hydrALAZINE (APRESOLINE) injection 10 mg  10 mg Intravenous Q6H PRN Fritzi Mandes, MD      . lisinopril (PRINIVIL,ZESTRIL) tablet 5 mg  5 mg Oral Daily Fritzi Mandes, MD   5 mg at 08/30/18 0747  . morphine 2 MG/ML injection 2-4 mg  2-4 mg Intravenous Q3H PRN Arta Silence, MD   4 mg at 08/30/18 0745  . ondansetron (ZOFRAN)  tablet 4 mg  4 mg Oral Q6H PRN Fritzi Mandes, MD       Or  . ondansetron [facility]) injection 4 mg  4 mg Intravenous Q6H PRN Fritzi Mandes, MD      . polyethylene glycol (MIRALAX / GLYCOLAX) packet 17 g  17 g Oral Daily PRN Fritzi Mandes, MD      . sodium phosphate (FLEET) 7-19 GM/118ML enema 1 enema  1 enema Rectal Once PRN Fritzi Mandes, MD      . traMADol Veatrice Bourbon) tablet 50 mg  50 mg Oral Q6H PRN Fritzi Mandes, MD   50 mg at 08/29/18 2118      Review of Systems A complete review of systems was asked and was negative except for the following positive findings shortness of breath, right-sided chest pain, weight loss, cough.  Blood pressure 138/81, pulse 64, temperature 98.2 F (36.8 C), resp. rate 20, height 6\' 3"  (1.905 m), weight 110.9 kg, SpO2 96 %.  Physical  Exam CONSTITUTIONAL:  Pleasant, well-developed, well-nourished, and in no acute distress. EYES: Pupils equal and reactive to light, Sclera non-icteric EARS, NOSE, MOUTH AND THROAT:  The oropharynx was clear.  Dentition is good repair.  Oral mucosa pink and moist. LYMPH NODES:  Lymph nodes in the neck and axillae were normal RESPIRATORY:  Lungs were clear on the left but markedly diminished on the right..  Normal respiratory effort without pathologic use of accessory muscles of respiration CARDIOVASCULAR: Heart was regular without murmurs.  There were no carotid bruits. GI: The abdomen was soft, nontender, and nondistended. There were no palpable masses. There was no hepatosplenomegaly. There were normal bowel sounds in all quadrants. GU:  Rectal deferred.   MUSCULOSKELETAL:  Normal muscle strength and tone.  No clubbing or cyanosis.   SKIN:  There were no pathologic skin lesions.  There were no nodules on palpation. NEUROLOGIC:  Sensation is normal.  Cranial nerves are grossly intact. PSYCH:  Oriented to person, place and time.  Mood and affect are normal.  Data Reviewed Multiple chest x-rays and CT scans  I have personally reviewed the patient's imaging, laboratory findings and medical records.    Assessment    Recurrent right-sided pleural effusion in the setting of community-acquired pneumonia.    Plan    I discussed his care with Dr. Wray Kearns and Dr. Aletta Edouard.  I believe that he would be a good candidate for percutaneous drainage.  I have placed an order for him to undergo CT-guided percutaneous drainage of his recurrent right-sided pleural effusion.  Depending upon the results of that we could consider intrapleural thrombolytics for management.       Nestor Lewandowsky, MD 08/30/2018, 10:16 AM

## 2018-08-30 NOTE — Progress Notes (Signed)
Pt reports a pain level of 9/10 1 hour after receiving 4 mg of Morphine and po Tramadol.  Pt breathing shallow and very uncomfortable. Dorna Bloom RN

## 2018-08-30 NOTE — Consult Note (Signed)
Pharmacy Antibiotic Note  Roy [name] is a 47 y.o. male admitted on 08/28/2018 with pleural effusion, s/p thoracentesis now febrile.  Pharmacy has been consulted for cefepime dosing.  Plan: cefepime 2g q 8 hr  Height: 6\' 3"  (190.5 cm) Weight: 244 lb 6.4 oz (110.9 kg) IBW/kg (Calculated) : 84.5  Temp (24hrs), Avg:99.3 F (37.4 C), Min:98.2 F (36.8 C), Max:100.4 F (38 C)  Recent Labs  Lab 08/28/18 0653 08/28/18 0802 08/28/18 0803 08/30/18 0735  WBC 11.7*  --   --  9.6  CREATININE  --   --  0.81  --   LATICACIDVEN  --  0.8  --   --     Estimated Creatinine Clearance: 150 mL/min (by C-G formula based on SCr of 0.81 mg/dL).    No Known Allergies  Antimicrobials this admission: cefepime 9/17 >>    Dose adjustments this admission:   Microbiology results: 9/15 pleural fluid: no organisms seen 9/17 PCT:  Thank you for allowing pharmacy to be a part of this patient's care.  Ramond Dial, Pharm.D, BCPS Clinical Pharmacist 08/30/2018 7:54 AM

## 2018-08-30 NOTE — Consult Note (Signed)
Pharmacy Antibiotic Note  Roy [name] is a 47 y.o. male admitted on 08/28/2018 with recurrent right pleural effusion, s/p treatment for PNA, now spiking fevers.  Pharmacy has been consulted for vancomycin and cefepime dosing.  Plan: Vancomycin 1500mg  once. Then next dose is 6 hours for stacked dosing Vancomycin 1000mg  IV every 8 hours.  Goal trough 15-20 mcg/mL. Trough prior to the 5th dose Continue Cefepime 2g q 8 hours  Height: 6\' 3"  (190.5 cm) Weight: 244 lb 6.4 oz (110.9 kg) IBW/kg (Calculated) : 84.5  Temp (24hrs), Avg:99.4 F (37.4 C), Min:98.2 F (36.8 C), Max:100.4 F (38 C)  Recent Labs  Lab 08/28/18 0653 08/28/18 0802 08/28/18 0803 08/30/18 0735  WBC 11.7*  --   --  9.6  CREATININE  --   --  0.81  --   LATICACIDVEN  --  0.8  --   --     Estimated Creatinine Clearance: 150 mL/min (by C-G formula based on SCr of 0.81 mg/dL).    No Known Allergies  Antimicrobials this admission: cefepime 9/17 >>  vancomycin 9/17 >>   Dose adjustments this admission:   Microbiology results: 9/15 pleural fluid: no organisms seen 9/17 PCT:0.12  Thank you for allowing pharmacy to be a part of this patient's care  Ramond Dial, Pharm.D, BCPS Clinical Pharmacist 08/30/2018 2:27 PM

## 2018-08-30 NOTE — Progress Notes (Addendum)
Pt requesting medication for anxiety and help him sleep tonight.  States that he has taken klonopin in past.  Spoke with Dr Leslye Peer and order received for .25 Klonopin po. Dorna Bloom RN Pt slept until 0430 and reports slept well.  Pt with temperature 100.4 this morning. Dorna Bloom RN

## 2018-08-30 NOTE — Consult Note (Signed)
Pulmonary Critical Care  Initial Consult Note  Roy [name] DXA:[id number] DOB: 06-May-1971 DOA: 08/28/2018  Referring physician: Fritzi Mandes, MD  Chief Complaint: Pneumonia  HPI: Roy [name] is a 47 y.o. male patient is known to me from prior admission to the hospital.  He had come in at that time with a diagnosis of pneumonia.  Patient was treated with antibiotics went home he was supposed to follow-up in the office.  He states that he started having increasing pain increasing shortness of breath.  He had called the office and was told to go to the emergency room.  Patient was admitted to the hospital and he was found to have increasing pleural effusion on the right side.  He had admitted to having increased shortness of breath.  He had been having some cough and was noted to be having a lot of pain in his chest.  Pain was located on the right side.  Hurts more when he was taking deep breaths.  Review of Systems:  Constitutional:  No weight loss, night sweats, +Fevers, chills, +fatigue.  HEENT:  No headaches, nasal congestion, post nasal drip,  Cardio-vascular:  +chest pain, Orthopnea, PND, swelling in lower extremities, anasarca, dizziness, palpitations  GI:  No heartburn, indigestion, abdominal pain, nausea, vomiting, diarrhea  Resp:  +shortness of breath with exertion and at rest. no productive cough, No coughing up of blood.No wheezing Skin:  no rash or lesions.  Musculoskeletal:  No joint pain or swelling.   Remainder ROS performed and is unremarkable other than noted in HPI  Past Medical History:  Diagnosis Date  . Aneurysm ([place])   . Cardiomyopathy ([place])    pt sts he no longer has it  . Hypertension   . Renal calculi    Past Surgical History:  Procedure Laterality Date  . ANEURYSM COILING     "a few years ago"   Social History:  reports that he has been smoking cigarettes. He has never used smokeless tobacco. He reports that he does not use drugs. His alcohol  history is not on file.  No Known Allergies  History reviewed. No pertinent family history.  Prior to Admission medications   Medication Sig Start Date End Date Taking? Authorizing Provider  guaiFENesin (ROBITUSSIN) 100 MG/5ML liquid Take 200 mg by mouth 3 (three) times daily as needed for cough or congestion.   Yes [provider]  ibuprofen (ADVIL,MOTRIN) 800 MG tablet Take 1 tablet (800 mg total) by mouth every 6 (six) hours as needed for mild pain or moderate pain. 08/19/18  Yes Mayo, Pete Pelt, MD  lisinopril (PRINIVIL,ZESTRIL) 5 MG tablet Take 1 tablet (5 mg total) by mouth daily. 08/20/18  Yes Mayo, Pete Pelt, MD   Physical Exam: Vitals:   08/29/18 2001 08/29/18 2110 08/30/18 0420 08/30/18 0738  BP: (!) 142/78  129/64 138/81  Pulse: (!) 55  66 64  Resp: 20  19 20   Temp: 100.2 F (37.9 C) 99 F (37.2 C) (!) 100.4 F (38 C) 98.2 F (36.8 C)  TempSrc: Oral     SpO2: 95%  96% 96%  Weight:      Height:        Wt Readings from Last 3 Encounters:  08/28/18 110.9 kg  08/17/18 114.4 kg    General:  Appears calm and comfortable Eyes: PERRL, normal lids, irises & conjunctiva ENT: grossly normal hearing, lips & tongue Neck: no LAD, masses or thyromegaly Cardiovascular: RRR, no m/r/g. No LE edema. Respiratory: CTA  bilaterally, no w/r/r.       Normal respiratory effort. Abdomen: soft, nontender Skin: no rash or induration seen on limited exam Musculoskeletal: grossly normal tone BUE/BLE Psychiatric: grossly normal mood and affect Neurologic: grossly non-focal.          Labs on Admission:  Basic Metabolic Panel: Recent Labs  Lab 08/28/18 0803  NA 138  K 3.7  CL 100  CO2 31  GLUCOSE 124*  BUN 12  CREATININE 0.81  CALCIUM 8.4*   Liver Function Tests: Recent Labs  Lab 08/28/18 0803  AST 80*  ALT 226*  ALKPHOS 125  BILITOT 0.8  PROT 7.3  ALBUMIN 2.5*   No results for input(s): LIPASE, AMYLASE in the last 168 hours. No results for input(s): AMMONIA  in the last 168 hours. CBC: Recent Labs  Lab 08/28/18 0653 08/30/18 0735  WBC 11.7* 9.6  NEUTROABS 8.3*  --   HGB 12.5* 11.9*  HCT 35.4* 34.8*  MCV 98.8 100.0  PLT 482* 463*   Cardiac Enzymes: Recent Labs  Lab 08/28/18 0803  TROPONINI <0.03    BNP (last 3 results) Recent Labs    08/28/18 0653  BNP 52.0    ProBNP (last 3 results) No results for input(s): PROBNP in the last 8760 hours.  CBG: No results for input(s): GLUCAP in the last 168 hours.  Radiological Exams on Admission: Ct Chest W Contrast  Result Date: 08/30/2018 CLINICAL DATA:  Shortness of breath EXAM: CT CHEST WITH CONTRAST TECHNIQUE: Multidetector CT imaging of the chest was performed during intravenous contrast administration. CONTRAST:  62mL OMNIPAQUE IOHEXOL 300 MG/ML  SOLN COMPARISON:  08/16/2018 FINDINGS: Cardiovascular: Heart is upper limits normal in size. Aorta is normal caliber. Mediastinum/Nodes: Small scattered mediastinal lymph nodes, none pathologically enlarged. No mediastinal, hilar, or axillary adenopathy. Lungs/Pleura: Enlarging right effusion, now large. Near complete atelectasis/collapse of the right lower lobe and right middle lobe. Compressive atelectasis in the posterior right upper lobe. Only a small area of right upper lobe is aerated. 1.8 cm spiculated nodule in the left upper lobe again noted. Previously seen nodular opacity or inferiorly in the left upper lobe currently measures 8 mm on image 95 compared to 2 cm previously. Peripheral nodule measures 9 mm in the left lower lobe on image 135, better seen on today's study due to improved atelectasis at the left base. Upper Abdomen: Imaging into the upper abdomen shows no acute findings. Musculoskeletal: Chest wall soft tissues are unremarkable. No acute bony abnormality. IMPRESSION: Enlarging right pleural effusion, now large with only a small amount of aerated right upper lobe remaining. Compressive atelectasis and/or infiltrate throughout  much of the right lung. Persistent nodular opacity in the anterior lingula, 1.8 cm currently. More inferiorly in the left upper lobe, the previously measured 2 cm masslike area has improved, now 8 mm. 9 mm nodular density peripherally in the left lower lobe. Recommend continued follow-up. Electronically Signed   By: Rolm Baptise M.D.   On: 08/30/2018 08:38   Dg Chest Port 1 View  Result Date: 08/28/2018 CLINICAL DATA:  Post right-sided thoracentesis. EXAM: PORTABLE CHEST 1 VIEW COMPARISON:  Chest radiograph - earlier same day FINDINGS: Slight reduction in persistent moderate-to-large potentially partially loculated right-sided effusion post thoracentesis. No pneumothorax. Minimally improved aeration of the right lung with persistent right mid and lower lung consolidative opacities. Grossly unchanged cardiac silhouette and mediastinal contours. The left hemithorax remains well aerated. The left-sided pleural effusion. No evidence of edema. No acute osseus abnormalities. Stigmata of DISH  within the thoracic spine. IMPRESSION: Slight reduction in persistent moderate to large sized right-sided effusion post large volume right-sided thoracentesis. No pneumothorax. Electronically Signed   By: Sandi Mariscal M.D.   On: 08/28/2018 19:29   US Thoracentesis Asp Pleural Space W/img Guide  Result Date: 08/28/2018 INDICATION: Symptomatic right-sided pleural effusion. Please from ultrasound-guided thoracentesis for diagnostic and therapeutic purposes. EXAM: US THORACENTESIS ASP PLEURAL SPACE W/IMG GUIDE COMPARISON:  Chest radiograph - 08/28/2018; 08/16/2018; chest CT - 08/16/2018 MEDICATIONS: None. COMPLICATIONS: None immediate. TECHNIQUE: Informed written consent was obtained from the patient after a discussion of the risks, benefits and alternatives to treatment. A timeout was performed prior to the initiation of the procedure. Initial ultrasound scanning demonstrates a large anechoic right-sided pleural effusion. The  lower chest was prepped and draped in the usual sterile fashion. 1% lidocaine was used for local anesthesia. An ultrasound image was saved for documentation purposes. An 8 Fr Safe-T-Centesis catheter was introduced. The thoracentesis was performed. The catheter was removed and a dressing was applied. The patient tolerated the procedure well without immediate post procedural complication. The patient was escorted to have an upright chest radiograph. FINDINGS: A total of approximately 1.5 liters of serous fluid was removed. Requested samples were sent to the laboratory. IMPRESSION: Successful ultrasound-guided right sided thoracentesis yielding 1.5 liters of pleural fluid. Electronically Signed   By: Sandi Mariscal M.D.   On: 08/28/2018 19:29    EKG: Independently reviewed.  Assessment/Plan Active Problems:   Pleural effusion on right   1. Acute respiratory failure.  Patient is significantly hypoxic secondary to the pneumonia as well as the pleural effusion.  Patient has significant effusion on the right side it appears to be that is probably loculated.  The patient will be continued on oxygen therapy at this time. 2. Pleural effusion as noted patient likely has an empyema.  Had a chest x-ray done which showed increasing effusion he underwent a thoracentesis and about 1.5 L of fluid was removed.  The results of the chemistry are noted in the chart.  Cultures pending. 3. Chest pain likely related to the pleural effusion.  Will go ahead and get him cardiothoracic surgery take a look at it and they recommended putting a chest tube and to drain and if this does not succeed then he will need to have decortication possibly 4. Vapor cigarette use once again I have counseled him on not using any type of vapor cigarettes.  Although this appears to be an infectious etiology there is is also a possibility that the vapor cigarette could have triggered the underlying inflammatory process within the lung that was  initially noted.  Code Status: Full code  Family Communication: Wife who was present in the room Disposition Plan: Home   I have personally obtained a history, examined the patient, evaluated laboratory and imaging results, formulated the assessment and plan and placed orders.  The Patient requires high complexity decision making for assessment and support. Total Time Spent 10min   Saadat A Khan, MD [facility] Pulmonary Critical Care Medicine Sleep Medicine

## 2018-08-31 LAB — VANCOMYCIN, TROUGH: Vancomycin Tr: 13 ug/mL — ABNORMAL LOW (ref 15–20)

## 2018-08-31 MED ORDER — IBUPROFEN 400 MG PO TABS
400.0000 mg | ORAL_TABLET | Freq: Three times a day (TID) | ORAL | Status: DC
  Administered 2018-08-31 – 2018-09-01 (×4): 400 mg via ORAL
  Filled 2018-08-31 (×4): qty 1

## 2018-08-31 MED ORDER — VANCOMYCIN HCL 10 G IV SOLR
1250.0000 mg | Freq: Three times a day (TID) | INTRAVENOUS | Status: DC
  Administered 2018-09-01 (×2): 1250 mg via INTRAVENOUS
  Filled 2018-08-31 (×5): qty 1250

## 2018-08-31 NOTE — Progress Notes (Signed)
Pt has fibrinous tissue in chest tube.  Milked tube and able to move some of it.  Spoke with Dr Jodell Cipro and he said to ask pulmonary/cardiothoracic to check in morning on rounds.  Pt has had 50 cc of yellow drainage from tube this shift. Dorna Bloom RN

## 2018-08-31 NOTE — Progress Notes (Signed)
Pharmacy Antibiotic Note  Roy [name] is a 47 y.o. male admitted on 08/28/2018 with recurrent right pleural effusion, s/p treatment for PNA, now spiking fevers.  Pharmacy has been consulted for vancomycin and cefepime dosing.  Plan: Vancomycin 1500mg  once. Then next dose is 6 hours for stacked dosing Vancomycin 1000mg  IV every 8 hours.  Goal trough 15-20 mcg/mL. Trough prior to the 5th dose Continue Cefepime 2g q 8 hours  9/18:  VT @ 20:00 = 13 mcg/mL Will increase dose to Vancomycin 1250 mg IV Q8H to start on 9/19 @ 0500 and recheck VT before 3rd new dose on 9/19 @ 20:30.   Height: 6\' 3"  (190.5 cm) Weight: 244 lb 6.4 oz (110.9 kg) IBW/kg (Calculated) : 84.5  Temp (24hrs), Avg:99.2 F (37.3 C), Min:99 F (37.2 C), Max:99.3 F (37.4 C)  Recent Labs  Lab 08/28/18 0653 08/28/18 0802 08/28/18 0803 08/30/18 0735 08/31/18 2006  WBC 11.7*  --   --  9.6  --   CREATININE  --   --  0.81  --   --   LATICACIDVEN  --  0.8  --   --   --   VANCOTROUGH  --   --   --   --  13*    Estimated Creatinine Clearance: 150 mL/min (by C-G formula based on SCr of 0.81 mg/dL).    No Known Allergies  Antimicrobials this admission: Azithromycin 9/3, vancomycin, meropenem 9/4  >>    >>   Dose adjustments this admission:   Microbiology results: 9/4 BCx: pending 9/4 UCx: pending  9/4 Sputum: pending  9/4 MRSA PCR: penidng      9/4 UA: pending 9/3 CXR: Right middle and base consolidation Thank you for allowing pharmacy to be a part of this patient's care.  Arsen Mangione D 08/31/2018 9:18 PM

## 2018-09-01 ENCOUNTER — Encounter (HOSPITAL_COMMUNITY): Payer: Self-pay | Admitting: Internal Medicine

## 2018-09-01 ENCOUNTER — Inpatient Hospital Stay (HOSPITAL_COMMUNITY)
Admission: AD | Admit: 2018-09-01 | Discharge: 2018-09-07 | DRG: 853 | Disposition: A | Discharge status: Home / Self Care | Payer: Self-pay | Source: Other Acute Inpatient Hospital | Attending: Family Medicine | Admitting: Family Medicine

## 2018-09-01 ENCOUNTER — Inpatient Hospital Stay: Payer: Self-pay

## 2018-09-01 DIAGNOSIS — R911 Solitary pulmonary nodule: Secondary | ICD-10-CM | POA: Diagnosis present

## 2018-09-01 DIAGNOSIS — J189 Pneumonia, unspecified organism: Secondary | ICD-10-CM | POA: Diagnosis present

## 2018-09-01 DIAGNOSIS — Z09 Encounter for follow-up examination after completed treatment for conditions other than malignant neoplasm: Secondary | ICD-10-CM

## 2018-09-01 DIAGNOSIS — Z87442 Personal history of urinary calculi: Secondary | ICD-10-CM

## 2018-09-01 DIAGNOSIS — Z79899 Other long term (current) drug therapy: Secondary | ICD-10-CM

## 2018-09-01 DIAGNOSIS — Z801 Family history of malignant neoplasm of trachea, bronchus and lung: Secondary | ICD-10-CM

## 2018-09-01 DIAGNOSIS — J939 Pneumothorax, unspecified: Secondary | ICD-10-CM

## 2018-09-01 DIAGNOSIS — F419 Anxiety disorder, unspecified: Secondary | ICD-10-CM | POA: Diagnosis present

## 2018-09-01 DIAGNOSIS — Z9689 Presence of other specified functional implants: Secondary | ICD-10-CM

## 2018-09-01 DIAGNOSIS — R945 Abnormal results of liver function studies: Secondary | ICD-10-CM

## 2018-09-01 DIAGNOSIS — D500 Iron deficiency anemia secondary to blood loss (chronic): Secondary | ICD-10-CM | POA: Diagnosis not present

## 2018-09-01 DIAGNOSIS — J90 Pleural effusion, not elsewhere classified: Secondary | ICD-10-CM | POA: Diagnosis present

## 2018-09-01 DIAGNOSIS — J9811 Atelectasis: Secondary | ICD-10-CM | POA: Diagnosis present

## 2018-09-01 DIAGNOSIS — Z87891 Personal history of nicotine dependence: Secondary | ICD-10-CM

## 2018-09-01 DIAGNOSIS — R7989 Other specified abnormal findings of blood chemistry: Secondary | ICD-10-CM | POA: Insufficient documentation

## 2018-09-01 DIAGNOSIS — A419 Sepsis, unspecified organism: Principal | ICD-10-CM | POA: Diagnosis present

## 2018-09-01 DIAGNOSIS — G4700 Insomnia, unspecified: Secondary | ICD-10-CM | POA: Diagnosis present

## 2018-09-01 DIAGNOSIS — J869 Pyothorax without fistula: Secondary | ICD-10-CM | POA: Diagnosis present

## 2018-09-01 DIAGNOSIS — Z720 Tobacco use: Secondary | ICD-10-CM | POA: Diagnosis present

## 2018-09-01 DIAGNOSIS — K219 Gastro-esophageal reflux disease without esophagitis: Secondary | ICD-10-CM | POA: Diagnosis present

## 2018-09-01 DIAGNOSIS — I10 Essential (primary) hypertension: Secondary | ICD-10-CM | POA: Diagnosis present

## 2018-09-01 HISTORY — DX: Tobacco use: Z72.0

## 2018-09-01 LAB — BODY FLUID CULTURE: Culture: NO GROWTH

## 2018-09-01 LAB — APTT: aPTT: 39 seconds — ABNORMAL HIGH (ref 24–36)

## 2018-09-01 LAB — ABO/RH: ABO/RH(D): O POS

## 2018-09-01 LAB — PROTIME-INR
INR: 1.19
PROTHROMBIN TIME: 15 s (ref 11.4–15.2)

## 2018-09-01 MED ORDER — HYDRALAZINE HCL 20 MG/ML IJ SOLN: 5.0000 mg | INTRAMUSCULAR | Status: DC | PRN

## 2018-09-01 MED ORDER — VANCOMYCIN HCL 10 G IV SOLR
1250.0000 mg | Freq: Three times a day (TID) | INTRAVENOUS | Status: DC
  Administered 2018-09-02 – 2018-09-05 (×10): 1250 mg via INTRAVENOUS
  Filled 2018-09-01 (×15): qty 1250

## 2018-09-01 MED ORDER — LISINOPRIL 5 MG PO TABS
5.0000 mg | ORAL_TABLET | Freq: Every day | ORAL | Status: DC
  Administered 2018-09-02 – 2018-09-04 (×3): 5 mg via ORAL
  Filled 2018-09-01 (×3): qty 1

## 2018-09-01 MED ORDER — SODIUM CHLORIDE 0.9 % IV SOLN: 2.0000 g | Freq: Three times a day (TID) | INTRAVENOUS | 0 refills | Status: DC

## 2018-09-01 MED ORDER — MORPHINE SULFATE (PF) 4 MG/ML IV SOLN
4.0000 mg | INTRAVENOUS | Status: DC | PRN
  Administered 2018-09-01: 2 mg via INTRAVENOUS
  Administered 2018-09-02 (×2): 4 mg via INTRAVENOUS
  Filled 2018-09-01 (×3): qty 1

## 2018-09-01 MED ORDER — SENNOSIDES-DOCUSATE SODIUM 8.6-50 MG PO TABS: 1.0000 | ORAL_TABLET | Freq: Every evening | ORAL | Status: DC | PRN

## 2018-09-01 MED ORDER — VANCOMYCIN HCL 10 G IV SOLR: 1250.0000 mg | Freq: Three times a day (TID) | INTRAVENOUS | 0 refills | Status: DC

## 2018-09-01 MED ORDER — SODIUM CHLORIDE 0.9 % IV SOLN
INTRAVENOUS | Status: DC
  Administered 2018-09-01 – 2018-09-02 (×2): via INTRAVENOUS

## 2018-09-01 MED ORDER — OXYCODONE HCL 5 MG PO TABS: 5.0000 mg | ORAL_TABLET | ORAL | Status: DC | PRN

## 2018-09-01 MED ORDER — ALBUTEROL SULFATE (2.5 MG/3ML) 0.083% IN NEBU: 2.5000 mg | INHALATION_SOLUTION | RESPIRATORY_TRACT | Status: DC | PRN

## 2018-09-01 MED ORDER — ONDANSETRON HCL 4 MG PO TABS: 4.0000 mg | ORAL_TABLET | Freq: Four times a day (QID) | ORAL | Status: DC | PRN

## 2018-09-01 MED ORDER — ONDANSETRON HCL 4 MG/2ML IJ SOLN: 4.0000 mg | Freq: Four times a day (QID) | INTRAMUSCULAR | Status: DC | PRN

## 2018-09-01 MED ORDER — IBUPROFEN 200 MG PO TABS
400.0000 mg | ORAL_TABLET | Freq: Four times a day (QID) | ORAL | Status: DC | PRN
  Administered 2018-09-02: 400 mg via ORAL
  Filled 2018-09-01 (×2): qty 1

## 2018-09-01 MED ORDER — ALPRAZOLAM 0.25 MG PO TABS
0.2500 mg | ORAL_TABLET | Freq: Three times a day (TID) | ORAL | Status: DC | PRN
  Administered 2018-09-01: 0.25 mg via ORAL
  Filled 2018-09-01: qty 1

## 2018-09-01 MED ORDER — GUAIFENESIN 100 MG/5ML PO SOLN
200.0000 mg | Freq: Four times a day (QID) | ORAL | Status: DC | PRN
  Administered 2018-09-02 – 2018-09-03 (×2): 200 mg via ORAL
  Filled 2018-09-01: qty 10
  Filled 2018-09-01 (×2): qty 5

## 2018-09-01 MED ORDER — OXYCODONE-ACETAMINOPHEN 5-325 MG PO TABS: 1.0000 | ORAL_TABLET | ORAL | Status: DC | PRN

## 2018-09-01 MED ORDER — SODIUM CHLORIDE 0.9% IV SOLUTION: Freq: Once | INTRAVENOUS | Status: DC

## 2018-09-01 MED ORDER — SODIUM CHLORIDE 0.9 % IV SOLN
2.0000 g | Freq: Three times a day (TID) | INTRAVENOUS | Status: DC
  Administered 2018-09-01 – 2018-09-07 (×17): 2 g via INTRAVENOUS
  Filled 2018-09-01 (×22): qty 2

## 2018-09-01 MED ORDER — MORPHINE SULFATE (PF) 2 MG/ML IV SOLN
2.0000 mg | INTRAVENOUS | Status: DC | PRN
  Administered 2018-09-01: 2 mg via INTRAVENOUS
  Filled 2018-09-01: qty 1

## 2018-09-01 MED ORDER — ZOLPIDEM TARTRATE 5 MG PO TABS
5.0000 mg | ORAL_TABLET | Freq: Every evening | ORAL | Status: DC | PRN
  Administered 2018-09-05 – 2018-09-06 (×2): 5 mg via ORAL
  Filled 2018-09-01 (×2): qty 1

## 2018-09-01 NOTE — Progress Notes (Signed)
She is seen and examined I am covering for Dr. Genevive Bi.  Briefly he developed a parapneumonic effusion and had initially a thoracentesis subsequently reaccumulated and require a CT-guided percutaneous drain.  1500cc and is good is continually draining.  Chest x-ray personally reviewed as well as a CT of the chest there is a complex multiloculated right parapneumonic effusion.  Patient is not septic not toxic but I do think upon reviewing all the films and despite chest tube that is not draining his parapneumonic fluid collection that he will require a formal VATS decortication to adequately drain the pleural space. Have discussed the case in detail with Dr. Posey Pronto and Dr.Bartle and he is in agreement with the transfer, I also d/w the pt in detail I have spent over 35 min int this encounter w > 50% spent in coordination and counseling of his care

## 2018-09-01 NOTE — Progress Notes (Signed)
Patient transferred to [facility] via [facility]. VSS. Madlyn Frankel, RN

## 2018-09-01 NOTE — Discharge Summary (Signed)
[facility] NAME: Roy [name]    MR#:  [id number]  DATE OF BIRTH:  [birth year]  DATE OF ADMISSION:  08/28/2018 ADMITTING PHYSICIAN: Fritzi Mandes, MD  DATE OF DISCHARGE: 09/01/2018  PRIMARY CARE PHYSICIAN: Patient, No Pcp Per    ADMISSION DIAGNOSIS:  Pleural effusion [J90] Hypoxia [R09.02]  DISCHARGE DIAGNOSIS:  Complex right recurrent Pleural effusion s/p CT placement Recent treatment for Pneumonia  SECONDARY DIAGNOSIS:   Past Medical History:  Diagnosis Date  . Aneurysm ([place])   . Cardiomyopathy ([place])    pt sts he no longer has it  . [name]   . Renal calculi     HOSPITAL COURSE:  Roy [name], who was recently discharged 96 2019 after diagnosed with acute hypoxic respiratory failure secondary to multifocal pneumonia. Patient was discharged on azithromycin and Cefdinirwith prednisone which he completed the course. He came in today with increasing shortness of breath found to have large right-sided pleural effusion.  1.right-sided large pleural effusion, recurrent,?empyema -ultrasound-guided thoracentesis removed 1.5 L of fluid.on admission--appears exudative -Elevated protein -gram stain negative -recently treated for multifocal pneumonia just completed a course of antibiotic along with steroids -d/w Dr oaks--s/p CT guided CT placement done-- 1.5 liters removed -spiking fever--now on IV cefepime and Vancomycin -CT chest done during last admission in August 16, 2018 did not show any evidence of solid mass-- were given history of smoking and weight being malignancy would be a differential as well. -pt has h/o vaping -repeat CXR from today shows possible loculated effusion--spoke with Dr Dahlia Byes. Recommends pt needs possible decortication. Since Dr Genevive Bi is out of town pt will need to transfer to [place]. D/w DR Cyndia Bent CT surgery [facility]  procedure tomorrow. Will transfer under hospitalist service  2hypertension  -continue lisinopril  3.Tobacco abuse patient states he is quit smoking for past week Pt has used Vaping before  4.DVT prophylaxis subcu Lovenox  CONSULTS OBTAINED:  Treatment Team:  Erby Pian, MD Allyne Gee, MD Jules Husbands, MD  DRUG ALLERGIES:  No Known Allergies  DISCHARGE MEDICATIONS:   Allergies as of 09/01/2018   No Known Allergies     Medication List    TAKE these medications   ceFEPIme 2 g in sodium chloride 0.9 % 100 mL Inject 2 g into the vein every 8 (eight) hours.   guaiFENesin 100 MG/5ML liquid Commonly known as:  ROBITUSSIN Take 200 mg by mouth 3 (three) times daily as needed for cough or congestion.   ibuprofen 800 MG tablet Commonly known as:  ADVIL,MOTRIN Take 1 tablet (800 mg total) by mouth every 6 (six) hours as needed for mild pain or moderate pain.   lisinopril 5 MG tablet Commonly known as:  PRINIVIL,ZESTRIL Take 1 tablet (5 mg total) by mouth daily.   vancomycin 1,250 mg in sodium chloride 0.9 % 250 mL Inject 1,250 mg into the vein every 8 (eight) hours.       If you experience worsening of your admission symptoms, develop shortness of breath, life threatening emergency, suicidal or homicidal thoughts you must seek medical attention immediately by calling 911 or calling your MD immediately  if symptoms less severe.  You Must read complete instructions/literature along with all the possible adverse reactions/side effects for all the Medicines you take and that have been prescribed to you. Take any new Medicines after you have completely understood and accept all the possible adverse reactions/side  effects.   Please note  You were cared for by a hospitalist during your hospital stay. If you have any questions about your discharge medications or the care you received while you were in the hospital after you are discharged, you can call the unit and  asked to speak with the hospitalist on call if the hospitalist that took care of you is not available. Once you are discharged, your primary care physician will handle any further medical issues. Please note that NO REFILLS for any discharge medications will be authorized once you are discharged, as it is imperative that you return to your primary care physician (or establish a relationship with a primary care physician if you do not have one) for your aftercare needs so that they can reassess your need for medications and monitor your lab values.   DATA REVIEW:   CBC  Recent Labs  Lab 08/30/18 0735  WBC 9.6  HGB 11.9*  HCT 34.8*  PLT 463*    Chemistries  Recent Labs  Lab 08/28/18 0803  NA 138  K 3.7  CL 100  CO2 31  GLUCOSE 124*  BUN 12  CREATININE 0.81  CALCIUM 8.4*  AST 80*  ALT 226*  ALKPHOS 125  BILITOT 0.8    Microbiology Results   Recent Results (from the past 240 hour(s))  Body fluid culture     Status: None   Collection Time: 08/28/18  7:34 PM  Result Value Ref Range Status   Specimen Description   Final    FLUID PLEURAL RIGHT Performed at [facility], [address] [place], [place] [address]    Special Requests   Final    NONE Performed at [facility], [address] [place], [place] [address]    Gram Stain   Final    RARE WBC PRESENT, PREDOMINANTLY MONONUCLEAR NO ORGANISMS SEEN    Culture   Final    NO GROWTH 3 DAYS Performed at [facility] [address] [place], [place] [address]    Report Status 09/01/2018 FINAL  Final    RADIOLOGY:  Dg Chest Port 1 View  Result Date: 09/01/2018 CLINICAL DATA:  Recurrent right pleural effusion. Recently treated for pneumonia now with fever spikes. History of smoking, cardiomyopathy. EXAM: PORTABLE CHEST 1 VIEW COMPARISON:  Portable chest x-ray of August 28, 2018 FINDINGS: There has been interval placement of a pigtail catheter in the lower right pleural space with  decreased volume of the right pleural effusion. Considerable fluid persists however especially in the lateral aspect of the hemithorax. Improved aeration of the right lung is seen. There are coarse lung markings at the right base. The left lung is well-expanded. The interstitial markings are mildly prominent though stable. The heart is top-normal in size. There is no significant pulmonary vascular congestion. IMPRESSION: Slight interval decrease in the volume of the right pleural effusion. There may refer there remains considerable pleural fluid on the right which may be loculated. Improved aeration of the right lung. Probable right basilar pneumonia. Electronically Signed   By: David  [name] M.D.   On: 09/01/2018 10:26   Ct Image Guided Drainage By Percutaneous Catheter  Result Date: 08/30/2018 CLINICAL DATA:  Right lung pneumonia with enlarging parapneumonic effusion despite recent thoracentesis. Request has been made to place a pigtail drainage catheter for evacuation. EXAM: CT GUIDED PLACEMENT OF RIGHT PLEURAL THORACOSTOMY TUBE ANESTHESIA/SEDATION: 2.0 mg IV Versed 100 mcg IV Fentanyl Total Moderate Sedation Time:  18 minutes The patient's  level of consciousness and physiologic status were continuously monitored during the procedure by Radiology nursing. PROCEDURE: The procedure, risks, benefits, and alternatives were explained to the patient. Questions regarding the procedure were encouraged and answered. The patient understands and consents to the procedure. A time out was performed prior to initiating the procedure. The right lateral chest wall was prepped with chlorhexidine in a sterile fashion, and a sterile drape was applied covering the operative field. A sterile gown and sterile gloves were used for the procedure. Local anesthesia was provided with 1% Lidocaine. CT was performed in a supine position with the right side rolled up slightly. After choosing a site for access, an 18 gauge trocar needle  was advanced under CT guidance into the right lower lateral pleural space. After return of fluid, a guidewire was advanced into the pleural space. The tract was dilated and a 12 French percutaneous pigtail drainage catheter was advanced over the wire. Catheter positioning was confirmed by CT. The catheter was connected to a Pleur-evac device. It was secured at the skin with a Prolene retention suture, StatLock device and overlying dressing. COMPLICATIONS: None FINDINGS: CT demonstrates a large right pleural effusion with significant compression of the right lung. After thoracostomy tube placement, there is return of dark yellow, clear fluid. IMPRESSION: Placement 12 French thoracostomy tube into the right pleural space for evacuation of a large parapneumonic effusion. The tube was attached to a Pleur-evac device which will be connected to wall suction at -20 cm of water. Electronically Signed   By: Aletta Edouard M.D.   On: 08/30/2018 16:42     Management plans discussed with the patient, family and they are in agreement.  CODE STATUS:     Code Status Orders  (From admission, onward)         Start     Ordered   08/28/18 0941  Full code  Continuous     08/28/18 0940        Code Status History    Date Active Date Inactive Code Status Order ID Comments User Context   08/17/2018 0142 08/19/2018 1750 Full Code [id number]  Arta Silence, MD Inpatient   08/17/2018 0142 08/17/2018 0142 Full Code [id number]  Arta Silence, MD Inpatient      TOTAL TIME TAKING CARE OF THIS PATIENT: *40 minutes.    Fritzi Mandes M.D on 09/01/2018 at 3:15 PM  Between 7am to 6pm - Pager - [phone] After 6pm go to [email or web address] - password [email or web address] [facility]  Office  [phone]  CC: Primary care physician; Patient, No Pcp Per

## 2018-09-01 NOTE — H&P (Addendum)
History and Physical    Roy [name] ZOX:[id number] DOB: [birth year] DOA: 09/01/2018  Referring MD/NP/PA:   PCP: Patient, No Pcp Per   Patient coming from:  The patient is coming from home.  At baseline, pt is independent for most of ADL.  Chief Complaint: SOB and chest pain  HPI: Roy [name] is a 47 y.o. male with medical history significant of hypertension, tobacco abuse, vaping THC, brain aneurysm (coiled 7 years ago), cardiomyopathy, who presents with shortness of breath and chest pain.  Pt is transferred from [facility].  Patient was recently admitted to the hospital from a 9/3-9/6 because of acute hypoxia respiratory failure, which was likely due to multifocal pneumonia. Pt was initially treated with vanc, meropenem, and azithromycin; transitioned to CTX/azithro 9/4, then changed to po omnicef and azithromycin total 5 day course at discharge. Per discharge summary, IR was consulted for thoracentesis on 9/5, but not enough fluid to drain.  Patient had negative respiratory virus panel, urine antigen for Legionella and strep.   Pt states that he completed the course of antibiotics, but developed increasing shortness of breath and right sided chest pain. Pt was admitted to [facility] on 08/28/2018. He was found to have large right-sided pleural effusion. Ultrasound-guided thoracentesis removed 1.5 L of fluid, which appears exudative, with elevated protein, but gram stain negative, concerning for possible empyema. Chest tube was placed. Pt had spiking fever and has been treated with IV vancomycin and cefepime. The repeated CXR from today showed possible loculated effusion. They consulted Dr. Cyndia Bent from [facility], planning decortication procedure tomorrow.   When I saw pt on the floor, he states that he still have shortness of breath, mild cough with clear-yellow mucus production. No subjective fever or chills.  His temperature is 99.1  currently.  He has had right-sided chest pain, which is constant, 7 out of 10 severity, nonradiating, aggravated by deep breath and coughing.  Denies nausea vomiting, diarrhea, abdominal pain, symptoms of UTI or unilateral weakness.  Patient states that he quit smoking 3 weeks ago, stopped vaping. He states that he does not need nicotine patch.  Review of Systems:   General: no fevers, chills, no body weight gain, has fatigue HEENT: no blurry vision, hearing changes or sore throat Respiratory: has dyspnea, coughing, no wheezing CV: has chest pain, no palpitations GI: no nausea, vomiting, abdominal pain, diarrhea, constipation GU: no dysuria, burning on urination, increased urinary frequency, hematuria  Ext: no leg edema Neuro: no unilateral weakness, numbness, or tingling, no vision change or hearing loss Skin: no rash, no skin tear. MSK: No muscle spasm, no deformity, no limitation of range of movement in spin Heme: No easy bruising.  Travel history: No recent long distant travel.  Allergy: No Known Allergies  Past Medical History:  Diagnosis Date  . Aneurysm ([place])   . Cardiomyopathy ([place])    pt sts he no longer has it  . Hypertension   . Renal calculi   . Tobacco abuse     Past Surgical History:  Procedure Laterality Date  . ANEURYSM COILING     "a few years ago"    Social History:  reports that he has quit smoking. His smoking use included cigarettes. He has never used smokeless tobacco. He reports that he has current or past drug history. Drug: Marijuana. His alcohol history is not on file.  Family History:  Family History  Problem Relation Age of Onset  . Lung cancer Father   .  Throat cancer Father      Prior to Admission medications   Medication Sig Start Date End Date Taking? Authorizing Provider  ceFEPIme 2 g in sodium chloride 0.9 % 100 mL Inject 2 g into the vein every 8 (eight) hours. 09/01/18   Fritzi Mandes, MD  guaiFENesin (ROBITUSSIN) 100 MG/5ML liquid Take  200 mg by mouth 3 (three) times daily as needed for cough or congestion.    [provider]  ibuprofen (ADVIL,MOTRIN) 800 MG tablet Take 1 tablet (800 mg total) by mouth every 6 (six) hours as needed for mild pain or moderate pain. 08/19/18   Mayo, Pete Pelt, MD  lisinopril (PRINIVIL,ZESTRIL) 5 MG tablet Take 1 tablet (5 mg total) by mouth daily. 08/20/18   Mayo, Pete Pelt, MD  vancomycin 1,250 mg in sodium chloride 0.9 % 250 mL Inject 1,250 mg into the vein every 8 (eight) hours. 09/01/18   Fritzi Mandes, MD    Physical Exam: Vitals:   09/01/18 2140 09/02/18 0005 09/02/18 0016 09/02/18 0328  BP:  (!) 146/55 (!) 133/51   Pulse:   80   Resp:   20 (!) 23  Temp:  98.8 F (37.1 C) 100 F (37.8 C) (!) 101 F (38.3 C)  TempSrc:  Oral Oral Oral  SpO2: 91%  96% 98%  Weight:      Height:       General: Not in acute distress HEENT:       Eyes: PERRL, EOMI, no scleral icterus.       ENT: No discharge from the ears and nose, no pharynx injection, no tonsillar enlargement.        Neck: No JVD, no bruit, no mass felt. Heme: No neck lymph node enlargement. Cardiac: S1/S2, RRR, No murmurs, No gallops or rubs. Respiratory: No rales, wheezing, rhonchi or rubs. Has decreased air movement on the right side. Chest tube in place on the right side GI: Soft, nondistended, nontender, no rebound pain, no organomegaly, BS present. GU: No hematuria Ext: No pitting leg edema bilaterally. 2+DP/PT pulse bilaterally. Musculoskeletal: No joint deformities, No joint redness or warmth, no limitation of ROM in spin. Skin: No rashes.  Neuro: Alert, oriented X3, cranial nerves II-XII grossly intact, moves all extremities normally.  Psych: Patient is not psychotic, no suicidal or hemocidal ideation.  Labs on Admission: I have personally reviewed following labs and imaging studies  CBC: Recent Labs  Lab 08/28/18 0653 08/30/18 0735  WBC 11.7* 9.6  NEUTROABS 8.3*  --   HGB 12.5* 11.9*  HCT 35.4* 34.8*  MCV  98.8 100.0  PLT 482* 086*   Basic Metabolic Panel: Recent Labs  Lab 08/28/18 0803  NA 138  K 3.7  CL 100  CO2 31  GLUCOSE 124*  BUN 12  CREATININE 0.81  CALCIUM 8.4*   GFR: Estimated Creatinine Clearance: 148.4 mL/min (by C-G formula based on SCr of 0.81 mg/dL). Liver Function Tests: Recent Labs  Lab 08/28/18 0803  AST 80*  ALT 226*  ALKPHOS 125  BILITOT 0.8  PROT 7.3  ALBUMIN 2.5*   No results for input(s): LIPASE, AMYLASE in the last 168 hours. No results for input(s): AMMONIA in the last 168 hours. Coagulation Profile: Recent Labs  Lab 09/01/18 2152  INR 1.19   Cardiac Enzymes: Recent Labs  Lab 08/28/18 0803  TROPONINI <0.03   BNP (last 3 results) No results for input(s): PROBNP in the last 8760 hours. HbA1C: No results for input(s): HGBA1C in the last 72 hours. CBG:  No results for input(s): GLUCAP in the last 168 hours. Lipid Profile: No results for input(s): CHOL, HDL, LDLCALC, TRIG, CHOLHDL, LDLDIRECT in the last 72 hours. Thyroid Function Tests: No results for input(s): TSH, T4TOTAL, FREET4, T3FREE, THYROIDAB in the last 72 hours. Anemia Panel: No results for input(s): VITAMINB12, FOLATE, FERRITIN, TIBC, IRON, RETICCTPCT in the last 72 hours. Urine analysis:    Component Value Date/Time   COLORURINE YELLOW (A) 08/17/2018 1635   APPEARANCEUR CLOUDY (A) 08/17/2018 1635   LABSPEC 1.016 08/17/2018 1635   PHURINE 5.0 08/17/2018 1635   GLUCOSEU >=500 (A) 08/17/2018 1635   HGBUR MODERATE (A) 08/17/2018 1635   BILIRUBINUR NEGATIVE 08/17/2018 1635   KETONESUR NEGATIVE 08/17/2018 1635   PROTEINUR NEGATIVE 08/17/2018 1635   NITRITE NEGATIVE 08/17/2018 1635   LEUKOCYTESUR NEGATIVE 08/17/2018 1635   Sepsis Labs: @LABRCNTIP (procalcitonin:4,lacticidven:4) ) Recent Results (from the past 240 hour(s))  Body fluid culture     Status: None   Collection Time: 08/28/18  7:34 PM  Result Value Ref Range Status   Specimen Description   Final    FLUID  PLEURAL RIGHT Performed at [facility], [address], [place], [place] [address]    Special Requests   Final    NONE Performed at [facility], [address] [place], [place] [address]    Gram Stain   Final    RARE WBC PRESENT, PREDOMINANTLY MONONUCLEAR NO ORGANISMS SEEN    Culture   Final    NO GROWTH 3 DAYS Performed at [facility], [place] [address] [place], [place] [address]    Report Status 09/01/2018 FINAL  Final     Radiological Exams on Admission: Dg Chest Port 1 View  Result Date: 09/01/2018 CLINICAL DATA:  Recurrent right pleural effusion. Recently treated for pneumonia now with fever spikes. History of smoking, cardiomyopathy. EXAM: PORTABLE CHEST 1 VIEW COMPARISON:  Portable chest x-ray of August 28, 2018 FINDINGS: There has been interval placement of a pigtail catheter in the lower right pleural space with decreased volume of the right pleural effusion. Considerable fluid persists however especially in the lateral aspect of the hemithorax. Improved aeration of the right lung is seen. There are coarse lung markings at the right base. The left lung is well-expanded. The interstitial markings are mildly prominent though stable. The heart is top-normal in size. There is no significant pulmonary vascular congestion. IMPRESSION: Slight interval decrease in the volume of the right pleural effusion. There may refer there remains considerable pleural fluid on the right which may be loculated. Improved aeration of the right lung. Probable right basilar pneumonia. Electronically Signed   By: David  [name] M.D.   On: 09/01/2018 10:26    EKG: will get one.   Assessment/Plan Principal Problem:   Empyema lung (HCC) Active Problems:   Pleural effusion on right   Multifocal pneumonia   Essential hypertension   Tobacco abuse   Sepsis ([place])  Pleural effusion on right, possible Empyema lung and recent multifocal pneumonia: Currently patient is not septic.   Temperature 99.1, pending CBC.  No tachycardia, no tachypneic.  Patient had R chest tube placed today. CT surgeon, Dr. Dema Severin was consulted, planning decortication tomorrow.  -will admit to tele bed as inpt -continue vancomycin and Zosyn -Repeat blood culture -prn albuterol nebs and Mucinex -pain control: PRN oxycodone and morphine -INR/PTT/type & screen -f/u CBC and CMP -NPO after MN  Addendum-sepsis: pt developed fever of 101 in the early AM, he has tachypnea, meets the criteria  for sepsis now. -will get Procalcitonin and trend lactic acid levels per sepsis protocol. -IVF: 2.5L of NS bolus in ED, followed by 125 cc/h    Essential hypertension:  -continue lisinopril -IV hydralazine as needed  Tobacco abuse and vaping THC: Patient states that he quit smoking 3 weeks ago, stopped weeping.  Patient states that he does not need nicotine patch. -Encouraged patient not to restart smoking or vaping.  Lung nodule: CT of chest on 08/30/18 showed persistent nodular opacity in the anterior lingula, 1.8 cm currently. More inferiorly in the left upper lobe, the previously measured 2 cm masslike area has improved, now 8 mm. 9 mm nodular density peripherally in the left lower lobe. Pt has family history of lung cancer and throat cancer (patient's father), will need to rule out lung cancer. -need to be followed up by PCP -may give referral to oncologist   Inpatient status:  # Patient requires inpatient status due to high intensity of service, high risk for further deterioration and high frequency of surveillance required.  I certify that at the point of admission it is my clinical judgment that the patient will require inpatient hospital care spanning beyond 2 midnights from the point of admission.  . This patient has multiple chronic comorbidities including hypertension, tobacco abuse, vaping THC, brain aneurysm (coiled 7 years ago), cardiomyopathy, . Now patient has presenting symptoms include  SOB, chest pain. . The worrisome physical exam findings include decreased air movement on the right side on auscultation. . The initial radiographic and laboratory data are worrisome because of possible empyema, will need decortication procedure. . Current medical needs: please see my assessment and plan   DVT ppx: SCD Code Status: Full code Family Communication:  Yes, patient's girlfriend at bed side Disposition Plan:  Anticipate discharge back to previous home environment Consults called:  CT surgeon, Dr. Lawrence Marseilles Admission status:   Inpatient/tele     Date of Service 09/02/2018    Ivor Costa [facility] Pager [phone]  If 7PM-7AM, please contact night-coverage [email or web address] Password [facility] 09/02/2018, 3:36 AM

## 2018-09-01 NOTE — Progress Notes (Addendum)
[place] [facility] [place] NAME: Roy [name]    MR#:  [id number]  DATE OF BIRTH:  05-03-1971  SUBJECTIVE:  Cont with sob repeat CT shows recurrent effusion Breathing ok   REVIEW OF SYSTEMS:   Review of Systems  Constitutional: Negative for chills, fever and weight loss.  HENT: Negative for ear discharge, ear pain and nosebleeds.   Eyes: Negative for blurred vision, pain and discharge.  Respiratory: Positive for cough and shortness of breath. Negative for sputum production, wheezing and stridor.   Cardiovascular: Negative for chest pain, palpitations, orthopnea and PND.  Gastrointestinal: Negative for abdominal pain, diarrhea, nausea and vomiting.  Genitourinary: Negative for frequency and urgency.  Musculoskeletal: Positive for back pain. Negative for joint pain.  Neurological: Negative for sensory change, speech change, focal weakness and weakness.  Psychiatric/Behavioral: Negative for depression and hallucinations. The patient is not nervous/anxious.    Tolerating Diet: yes Tolerating PT: ambulatory  DRUG ALLERGIES:  No Known Allergies  VITALS:  Blood pressure 131/78, pulse (!) 54, temperature 98.5 F (36.9 C), temperature source Oral, resp. rate 16, height 6\' 3"  (1.905 m), weight 110.9 kg, SpO2 96 %.  PHYSICAL EXAMINATION:   Physical Exam  GENERAL:  47 y.o.-year-old patient lying in the bed with no acute distress.  EYES: Pupils equal, round, reactive to light and accommodation. No scleral icterus. Extraocular muscles intact.  HEENT: Head atraumatic, normocephalic. Oropharynx and nasopharynx clear.  NECK:  Supple, no jugular venous distention. No thyroid enlargement, no tenderness.  LUNGS: Normal breath sounds bilaterally, no wheezing, rales, rhonchi. No use of accessory muscles of respiration. Decrease breath sounds on the right. CARDIOVASCULAR: S1, S2 normal. No murmurs, rubs, or gallops.  ABDOMEN: Soft, nontender,  nondistended. Bowel sounds present. No organomegaly or mass.  EXTREMITIES: No cyanosis, clubbing or edema b/l.    NEUROLOGIC: Cranial nerves II through XII are intact. No focal Motor or sensory deficits b/l.   PSYCHIATRIC:  patient is alert and oriented x 3.  SKIN: No obvious rash, lesion, or ulcer.   LABORATORY PANEL:  CBC Recent Labs  Lab 08/30/18 0735  WBC 9.6  HGB 11.9*  HCT 34.8*  PLT 463*    Chemistries  Recent Labs  Lab 08/28/18 0803  NA 138  K 3.7  CL 100  CO2 31  GLUCOSE 124*  BUN 12  CREATININE 0.81  CALCIUM 8.4*  AST 80*  ALT 226*  ALKPHOS 125  BILITOT 0.8   Cardiac Enzymes Recent Labs  Lab 08/28/18 0803  TROPONINI <0.03   RADIOLOGY:  Ct Image Guided Drainage By Percutaneous Catheter  Result Date: 08/30/2018 CLINICAL DATA:  Right lung pneumonia with enlarging parapneumonic effusion despite recent thoracentesis. Request has been made to place a pigtail drainage catheter for evacuation. EXAM: CT GUIDED PLACEMENT OF RIGHT PLEURAL THORACOSTOMY TUBE ANESTHESIA/SEDATION: 2.0 mg IV Versed 100 mcg IV Fentanyl Total Moderate Sedation Time:  18 minutes The patient's level of consciousness and physiologic status were continuously monitored during the procedure by Radiology nursing. PROCEDURE: The procedure, risks, benefits, and alternatives were explained to the patient. Questions regarding the procedure were encouraged and answered. The patient understands and consents to the procedure. A time out was performed prior to initiating the procedure. The right lateral chest wall was prepped with chlorhexidine in a sterile fashion, and a sterile drape was applied covering the operative field. A sterile gown and sterile gloves were used for the procedure. Local anesthesia was provided with 1% Lidocaine. CT was  performed in a supine position with the right side rolled up slightly. After choosing a site for access, an 18 gauge trocar needle was advanced under CT guidance into the  right lower lateral pleural space. After return of fluid, a guidewire was advanced into the pleural space. The tract was dilated and a 12 French percutaneous pigtail drainage catheter was advanced over the wire. Catheter positioning was confirmed by CT. The catheter was connected to a Pleur-evac device. It was secured at the skin with a Prolene retention suture, StatLock device and overlying dressing. COMPLICATIONS: None FINDINGS: CT demonstrates a large right pleural effusion with significant compression of the right lung. After thoracostomy tube placement, there is return of dark yellow, clear fluid. IMPRESSION: Placement 12 French thoracostomy tube into the right pleural space for evacuation of a large parapneumonic effusion. The tube was attached to a Pleur-evac device which will be connected to wall suction at -20 cm of water. Electronically Signed   By: Aletta Edouard M.D.   On: 08/30/2018 16:42   ASSESSMENT AND PLAN:  Roy [name]  is a 47 y.o. male with a known history of hypertension, who was recently discharged 96 2019 after diagnosed with acute hypoxic respiratory failure secondary to multifocal pneumonia. Patient was discharged on azithromycin and Cefdinir with prednisone which he completed the course. He came in today with increasing shortness of breath found to have large right-sided pleural effusion.  1. right-sided large pleural effusion, recurrent -etiology remains unclear ?synpneumonic -ultrasound-guided thoracentesis removed 1.5 L of fluid.on admission--appears exudative -Elevated protein -gram stain negative -recently treated for multifocal pneumonia just completed a course of antibiotic along with steroids -d/w Dr Mitzie Na see pt.will need CT placement -CT chest done during last admission in August 16, 2018 did not show any evidence of solid mass-- were given history of smoking and weight being malignancy would be a differential as well.   2 hypertension  -continue  lisinopril  3. Tobacco abuse patient states he is quit smoking for past week Pt has used Vaping before  4. DVT prophylaxis subcu Lovenox   Case discussed with Care Management/Social Worker. Management plans discussed with the patient, family and they are in agreement.  CODE STATUS: full  DVT Prophylaxis: lovenox  TOTAL TIME TAKING CARE OF THIS PATIENT: *35 minutes.  >50% time spent on counselling and coordination of care  POSSIBLE D/C IN *few DAYS, DEPENDING ON CLINICAL CONDITION.  Note: This dictation was prepared with Dragon dictation along with smaller phrase technology. Any transcriptional errors that result from this process are unintentional.  Fritzi Mandes M.D  Between 7am to 6pm - Pager - [phone]  After 6pm go to [email or web address] - password [email or web address] [facility]  Office  [phone]  CC: Primary care physician; Patient, No Pcp PerPatient ID: Roy [name], male   DOB: [birth year], 47 y.o.   MRN: [id number]

## 2018-09-01 NOTE — Progress Notes (Signed)
Completed getting report from [facility] by  Meyer Cory, RN. Pt is on the way transferring to [facility] room 03.    Kennyth Lose, RN

## 2018-09-01 NOTE — Progress Notes (Signed)
[facility] [place] NAME: Roy [name]    MR#:  [id number]  DATE OF BIRTH:  1971-03-16  SUBJECTIVE:  Cont with pain at CT tube site.  Breathing ok   REVIEW OF SYSTEMS:   Review of Systems  Constitutional: Negative for chills, fever and weight loss.  HENT: Negative for ear discharge, ear pain and nosebleeds.   Eyes: Negative for blurred vision, pain and discharge.  Respiratory: Positive for cough and shortness of breath. Negative for sputum production, wheezing and stridor.   Cardiovascular: Negative for chest pain, palpitations, orthopnea and PND.  Gastrointestinal: Negative for abdominal pain, diarrhea, nausea and vomiting.  Genitourinary: Negative for frequency and urgency.  Musculoskeletal: Positive for back pain. Negative for joint pain.  Neurological: Negative for sensory change, speech change, focal weakness and weakness.  Psychiatric/Behavioral: Negative for depression and hallucinations. The patient is not nervous/anxious.    Tolerating Diet: yes Tolerating PT: ambulatory  DRUG ALLERGIES:  No Known Allergies  VITALS:  Blood pressure 131/78, pulse (!) 54, temperature 98.5 F (36.9 C), temperature source Oral, resp. rate 16, height 6\' 3"  (1.905 m), weight 110.9 kg, SpO2 96 %.  PHYSICAL EXAMINATION:   Physical Exam  GENERAL:  47 y.o.-year-old patient lying in the bed with no acute distress.  EYES: Pupils equal, round, reactive to light and accommodation. No scleral icterus. Extraocular muscles intact.  HEENT: Head atraumatic, normocephalic. Oropharynx and nasopharynx clear.  NECK:  Supple, no jugular venous distention. No thyroid enlargement, no tenderness.  LUNGS: Normal breath sounds bilaterally, no wheezing, rales, rhonchi. No use of accessory muscles of respiration. Decrease breath sounds on the right.CT ++ CARDIOVASCULAR: S1, S2 normal. No murmurs, rubs, or gallops.  ABDOMEN: Soft, nontender, nondistended.  Bowel sounds present. No organomegaly or mass.  EXTREMITIES: No cyanosis, clubbing or edema b/l.    NEUROLOGIC: Cranial nerves II through XII are intact. No focal Motor or sensory deficits b/l.   PSYCHIATRIC:  patient is alert and oriented x 3.  SKIN: No obvious rash, lesion, or ulcer.   LABORATORY PANEL:  CBC Recent Labs  Lab 08/30/18 0735  WBC 9.6  HGB 11.9*  HCT 34.8*  PLT 463*    Chemistries  Recent Labs  Lab 08/28/18 0803  NA 138  K 3.7  CL 100  CO2 31  GLUCOSE 124*  BUN 12  CREATININE 0.81  CALCIUM 8.4*  AST 80*  ALT 226*  ALKPHOS 125  BILITOT 0.8   Cardiac Enzymes Recent Labs  Lab 08/28/18 0803  TROPONINI <0.03   RADIOLOGY:  Dg Chest Port 1 View  Result Date: 09/01/2018 CLINICAL DATA:  Recurrent right pleural effusion. Recently treated for pneumonia now with fever spikes. History of smoking, cardiomyopathy. EXAM: PORTABLE CHEST 1 VIEW COMPARISON:  Portable chest x-ray of August 28, 2018 FINDINGS: There has been interval placement of a pigtail catheter in the lower right pleural space with decreased volume of the right pleural effusion. Considerable fluid persists however especially in the lateral aspect of the hemithorax. Improved aeration of the right lung is seen. There are coarse lung markings at the right base. The left lung is well-expanded. The interstitial markings are mildly prominent though stable. The heart is top-normal in size. There is no significant pulmonary vascular congestion. IMPRESSION: Slight interval decrease in the volume of the right pleural effusion. There may refer there remains considerable pleural fluid on the right which may be loculated. Improved aeration of the right lung. Probable right basilar pneumonia.  Electronically Signed   By: David  [name] M.D.   On: 09/01/2018 10:26   Ct Image Guided Drainage By Percutaneous Catheter  Result Date: 08/30/2018 CLINICAL DATA:  Right lung pneumonia with enlarging parapneumonic effusion  despite recent thoracentesis. Request has been made to place a pigtail drainage catheter for evacuation. EXAM: CT GUIDED PLACEMENT OF RIGHT PLEURAL THORACOSTOMY TUBE ANESTHESIA/SEDATION: 2.0 mg IV Versed 100 mcg IV Fentanyl Total Moderate Sedation Time:  18 minutes The patient's level of consciousness and physiologic status were continuously monitored during the procedure by Radiology nursing. PROCEDURE: The procedure, risks, benefits, and alternatives were explained to the patient. Questions regarding the procedure were encouraged and answered. The patient understands and consents to the procedure. A time out was performed prior to initiating the procedure. The right lateral chest wall was prepped with chlorhexidine in a sterile fashion, and a sterile drape was applied covering the operative field. A sterile gown and sterile gloves were used for the procedure. Local anesthesia was provided with 1% Lidocaine. CT was performed in a supine position with the right side rolled up slightly. After choosing a site for access, an 18 gauge trocar needle was advanced under CT guidance into the right lower lateral pleural space. After return of fluid, a guidewire was advanced into the pleural space. The tract was dilated and a 12 French percutaneous pigtail drainage catheter was advanced over the wire. Catheter positioning was confirmed by CT. The catheter was connected to a Pleur-evac device. It was secured at the skin with a Prolene retention suture, StatLock device and overlying dressing. COMPLICATIONS: None FINDINGS: CT demonstrates a large right pleural effusion with significant compression of the right lung. After thoracostomy tube placement, there is return of dark yellow, clear fluid. IMPRESSION: Placement 12 French thoracostomy tube into the right pleural space for evacuation of a large parapneumonic effusion. The tube was attached to a Pleur-evac device which will be connected to wall suction at -20 cm of water.  Electronically Signed   By: Aletta Edouard M.D.   On: 08/30/2018 16:42   ASSESSMENT AND PLAN:  Roy [name]  is a 47 y.o. male with a known history of hypertension, who was recently discharged 96 2019 after diagnosed with acute hypoxic respiratory failure secondary to multifocal pneumonia. Patient was discharged on azithromycin and Cefdinir with prednisone which he completed the course. He came in today with increasing shortness of breath found to have large right-sided pleural effusion.  1. right-sided large pleural effusion, recurrent -etiology remains unclear ?synpneumonic -ultrasound-guided thoracentesis removed 1.5 L of fluid.on admission--appears exudative -Elevated protein -gram stain negative -recently treated for multifocal pneumonia just completed a course of antibiotic along with steroids -d/w Dr oaks--s/p CT guided CT placement done -spiking fever--now on IV cefepime and Vancomycin -CT chest done during last admission in August 16, 2018 did not show any evidence of solid mass-- were given history of smoking and weight being malignancy would be a differential as well. -pt has h/o vaping -repeat CXR from today shows possible loculated effusion--spoke with Dr Dahlia Byes. Recommends pt needs possible decortication. Since Dr Genevive Bi is out of town pt will need to transfer to [place]. D/w DR Cyndia Bent CT surgery at [facility]--waiting for repsonse  2 hypertension  -continue lisinopril  3. Tobacco abuse patient states he is quit smoking for past week Pt has used Vaping before  4. DVT prophylaxis subcu Lovenox  D/w pt about transfer. Case discussed with Care Management/Social Worker. Management plans discussed with the patient, family and they  are in agreement.  CODE STATUS: full  DVT Prophylaxis: lovenox  TOTAL TIME TAKING CARE OF THIS PATIENT: *35 minutes.  >50% time spent on counselling and coordination of care  POSSIBLE D/C IN *few DAYS, DEPENDING ON CLINICAL  CONDITION.  Note: This dictation was prepared with Dragon dictation along with smaller phrase technology. Any transcriptional errors that result from this process are unintentional.  Fritzi Mandes M.D  Between 7am to 6pm - Pager - [phone]  After 6pm go to [email or web address] - password [email or web address] [facility]  Office  [phone]  CC: Primary care physician; Patient, No Pcp PerPatient ID: Roy [name], male   DOB: [birth year], 47 y.o.   MRN: [id number]

## 2018-09-01 NOTE — Progress Notes (Signed)
[facility] [place] NAME: Roy [name]    MR#:  [id number]  DATE OF BIRTH:  1971-01-27  SUBJECTIVE:  Cont with pain at CT tube site Breathing ok   REVIEW OF SYSTEMS:   Review of Systems  Constitutional: Negative for chills, fever and weight loss.  HENT: Negative for ear discharge, ear pain and nosebleeds.   Eyes: Negative for blurred vision, pain and discharge.  Respiratory: Positive for cough and shortness of breath. Negative for sputum production, wheezing and stridor.   Cardiovascular: Negative for chest pain, palpitations, orthopnea and PND.  Gastrointestinal: Negative for abdominal pain, diarrhea, nausea and vomiting.  Genitourinary: Negative for frequency and urgency.  Musculoskeletal: Positive for back pain. Negative for joint pain.  Neurological: Negative for sensory change, speech change, focal weakness and weakness.  Psychiatric/Behavioral: Negative for depression and hallucinations. The patient is not nervous/anxious.    Tolerating Diet: yes Tolerating PT: ambulatory  DRUG ALLERGIES:  No Known Allergies  VITALS:  Blood pressure 131/78, pulse (!) 54, temperature 98.5 F (36.9 C), temperature source Oral, resp. rate 16, height 6\' 3"  (1.905 m), weight 110.9 kg, SpO2 96 %.  PHYSICAL EXAMINATION:   Physical Exam  GENERAL:  47 y.o.-year-old patient lying in the bed with no acute distress.  EYES: Pupils equal, round, reactive to light and accommodation. No scleral icterus. Extraocular muscles intact.  HEENT: Head atraumatic, normocephalic. Oropharynx and nasopharynx clear.  NECK:  Supple, no jugular venous distention. No thyroid enlargement, no tenderness.  LUNGS: Normal breath sounds bilaterally, no wheezing, rales, rhonchi. No use of accessory muscles of respiration. Decrease breath sounds on the right.CT ++ CARDIOVASCULAR: S1, S2 normal. No murmurs, rubs, or gallops.  ABDOMEN: Soft, nontender, nondistended.  Bowel sounds present. No organomegaly or mass.  EXTREMITIES: No cyanosis, clubbing or edema b/l.    NEUROLOGIC: Cranial nerves II through XII are intact. No focal Motor or sensory deficits b/l.   PSYCHIATRIC:  patient is alert and oriented x 3.  SKIN: No obvious rash, lesion, or ulcer.   LABORATORY PANEL:  CBC Recent Labs  Lab 08/30/18 0735  WBC 9.6  HGB 11.9*  HCT 34.8*  PLT 463*    Chemistries  Recent Labs  Lab 08/28/18 0803  NA 138  K 3.7  CL 100  CO2 31  GLUCOSE 124*  BUN 12  CREATININE 0.81  CALCIUM 8.4*  AST 80*  ALT 226*  ALKPHOS 125  BILITOT 0.8   Cardiac Enzymes Recent Labs  Lab 08/28/18 0803  TROPONINI <0.03   RADIOLOGY:  Ct Image Guided Drainage By Percutaneous Catheter  Result Date: 08/30/2018 CLINICAL DATA:  Right lung pneumonia with enlarging parapneumonic effusion despite recent thoracentesis. Request has been made to place a pigtail drainage catheter for evacuation. EXAM: CT GUIDED PLACEMENT OF RIGHT PLEURAL THORACOSTOMY TUBE ANESTHESIA/SEDATION: 2.0 mg IV Versed 100 mcg IV Fentanyl Total Moderate Sedation Time:  18 minutes The patient's level of consciousness and physiologic status were continuously monitored during the procedure by Radiology nursing. PROCEDURE: The procedure, risks, benefits, and alternatives were explained to the patient. Questions regarding the procedure were encouraged and answered. The patient understands and consents to the procedure. A time out was performed prior to initiating the procedure. The right lateral chest wall was prepped with chlorhexidine in a sterile fashion, and a sterile drape was applied covering the operative field. A sterile gown and sterile gloves were used for the procedure. Local anesthesia was provided with 1% Lidocaine. CT was  performed in a supine position with the right side rolled up slightly. After choosing a site for access, an 18 gauge trocar needle was advanced under CT guidance into the right lower  lateral pleural space. After return of fluid, a guidewire was advanced into the pleural space. The tract was dilated and a 12 French percutaneous pigtail drainage catheter was advanced over the wire. Catheter positioning was confirmed by CT. The catheter was connected to a Pleur-evac device. It was secured at the skin with a Prolene retention suture, StatLock device and overlying dressing. COMPLICATIONS: None FINDINGS: CT demonstrates a large right pleural effusion with significant compression of the right lung. After thoracostomy tube placement, there is return of dark yellow, clear fluid. IMPRESSION: Placement 12 French thoracostomy tube into the right pleural space for evacuation of a large parapneumonic effusion. The tube was attached to a Pleur-evac device which will be connected to wall suction at -20 cm of water. Electronically Signed   By: Aletta Edouard M.D.   On: 08/30/2018 16:42   ASSESSMENT AND PLAN:  Roy [name]  is a 47 y.o. male with a known history of hypertension, who was recently discharged 96 2019 after diagnosed with acute hypoxic respiratory failure secondary to multifocal pneumonia. Patient was discharged on azithromycin and Cefdinir with prednisone which he completed the course. He came in today with increasing shortness of breath found to have large right-sided pleural effusion.  1. right-sided large pleural effusion, recurrent -etiology remains unclear ?synpneumonic -ultrasound-guided thoracentesis removed 1.5 L of fluid.on admission--appears exudative -Elevated protein -gram stain negative -recently treated for multifocal pneumonia just completed a course of antibiotic along with steroids -d/w Dr oaks--s/p CT guided CT placement done -spiking fever--now on IV cefepime and Vancomycin -CT chest done during last admission in August 16, 2018 did not show any evidence of solid mass-- were given history of smoking and weight being malignancy would be a differential as  well. -pt has h/o vaping  2 hypertension  -continue lisinopril  3. Tobacco abuse patient states he is quit smoking for past week Pt has used Vaping before  4. DVT prophylaxis subcu Lovenox   Case discussed with Care Management/Social Worker. Management plans discussed with the patient, family and they are in agreement.  CODE STATUS: full  DVT Prophylaxis: lovenox  TOTAL TIME TAKING CARE OF THIS PATIENT: *35 minutes.  >50% time spent on counselling and coordination of care  POSSIBLE D/C IN *few DAYS, DEPENDING ON CLINICAL CONDITION.  Note: This dictation was prepared with Dragon dictation along with smaller phrase technology. Any transcriptional errors that result from this process are unintentional.  Fritzi Mandes M.D  Between 7am to 6pm - Pager - [phone]  After 6pm go to [email or web address] - password [email or web address] [facility]  Office  [phone]  CC: Primary care physician; Patient, No Pcp PerPatient ID: Roy [name], male   DOB: May 14, 1971, 47 y.o.   MRN: [id number]

## 2018-09-01 NOTE — Anesthesia Preprocedure Evaluation (Addendum)
Anesthesia Evaluation  Patient identified by MRN, date of birth, ID band Patient awake    Reviewed: Allergy & Precautions, NPO status , Patient's Chart, lab work & pertinent test results  History of Anesthesia Complications Negative for: history of anesthetic complications  Airway Mallampati: II  TM Distance: >3 FB Neck ROM: Full    Dental no notable dental hx.    Pulmonary pneumonia (recent pna with large residual pleural effusion), Current Smoker, former smoker,    breath sounds clear to auscultation       Cardiovascular hypertension, Normal cardiovascular exam Rhythm:Regular Rate:Normal  TTE 08/17/18: Study Conclusions  - Left ventricle: The cavity size was normal. There was moderate   concentric hypertrophy. The estimated ejection fraction was 70%.   Wall motion was normal; there were no regional wall motion   abnormalities. Doppler parameters are consistent with abnormal   left ventricular relaxation (grade 1 diastolic dysfunction). - Aortic valve: Valve area (VTI): 3.19 cm^2. Valve area (Vmax):   2.69 cm^2. Valve area (Vmean): 3.12 cm^2. - Mitral valve: There was mild regurgitation. Valve area by   continuity equation (using LVOT flow): 2.77 cm^2. - Left atrium: The atrium was mildly dilated. - Right ventricle: The cavity size was mildly dilated.  Impressions:  - Normal LVEF, normal wallmotion, diastlic dysfunction   Neuro/Psych Cerebral aneurysm s/p coiling  negative psych ROS   GI/Hepatic negative GI ROS, Neg liver ROS,   Endo/Other  negative endocrine ROS  Renal/GU negative Renal ROS  negative genitourinary   Musculoskeletal negative musculoskeletal ROS (+)   Abdominal   Peds  Hematology negative hematology ROS (+)   Anesthesia Other Findings   Reproductive/Obstetrics                            Anesthesia Physical Anesthesia Plan  ASA: III  Anesthesia Plan: General    Post-op Pain Management:    Induction:   PONV Risk Score and Plan: 2 and Ondansetron, Dexamethasone and Treatment may vary due to age or medical condition  Airway Management Planned: Double Lumen EBT  Additional Equipment: Arterial line  Intra-op Plan:   Post-operative Plan: Extubation in OR  Informed Consent: I have reviewed the patients History and Physical, chart, labs and discussed the procedure including the risks, benefits and alternatives for the proposed anesthesia with the patient or authorized representative who has indicated his/her understanding and acceptance.     Plan Discussed with:   Anesthesia Plan Comments:        Anesthesia Quick Evaluation

## 2018-09-02 ENCOUNTER — Encounter (HOSPITAL_COMMUNITY)
Admission: AD | Disposition: A | Discharge status: Home / Self Care | Payer: Self-pay | Source: Other Acute Inpatient Hospital | Attending: Thoracic Surgery (Cardiothoracic Vascular Surgery)

## 2018-09-02 ENCOUNTER — Inpatient Hospital Stay (HOSPITAL_COMMUNITY): Payer: Self-pay | Admitting: Anesthesiology

## 2018-09-02 ENCOUNTER — Other Ambulatory Visit: Payer: Self-pay

## 2018-09-02 ENCOUNTER — Inpatient Hospital Stay (HOSPITAL_COMMUNITY): Payer: Self-pay

## 2018-09-02 ENCOUNTER — Inpatient Hospital Stay: Admit: 2018-09-02 | Payer: Self-pay | Admitting: Thoracic Surgery (Cardiothoracic Vascular Surgery)

## 2018-09-02 DIAGNOSIS — A419 Sepsis, unspecified organism: Secondary | ICD-10-CM | POA: Diagnosis present

## 2018-09-02 DIAGNOSIS — J869 Pyothorax without fistula: Secondary | ICD-10-CM

## 2018-09-02 HISTORY — PX: VIDEO ASSISTED THORACOSCOPY (VATS)/THOROCOTOMY: SHX6173

## 2018-09-02 HISTORY — PX: DECORTICATION: SHX5101

## 2018-09-02 HISTORY — PX: PLEURAL EFFUSION DRAINAGE: SHX5099

## 2018-09-02 LAB — COMPREHENSIVE METABOLIC PANEL
ALBUMIN: 1.8 g/dL — AB (ref 3.5–5.0)
ALK PHOS: 119 U/L (ref 38–126)
ALT: 147 U/L — AB (ref 0–44)
ANION GAP: 10 (ref 5–15)
AST: 73 U/L — AB (ref 15–41)
BILIRUBIN TOTAL: 0.4 mg/dL (ref 0.3–1.2)
BUN: 9 mg/dL (ref 6–20)
CO2: 26 mmol/L (ref 22–32)
Calcium: 8 mg/dL — ABNORMAL LOW (ref 8.9–10.3)
Chloride: 102 mmol/L (ref 98–111)
Creatinine, Ser: 0.74 mg/dL (ref 0.61–1.24)
GFR calc Af Amer: >60 mL/min (ref >60)
GFR calc non Af Amer: >60 mL/min (ref >60)
GLUCOSE: 111 mg/dL — AB (ref 70–99)
Potassium: 4.4 mmol/L (ref 3.5–5.1)
SODIUM: 138 mmol/L (ref 135–145)
Total Protein: 6 g/dL — ABNORMAL LOW (ref 6.5–8.1)

## 2018-09-02 LAB — POCT I-STAT 7, (LYTES, BLD GAS, ICA,H+H)
Acid-Base Excess: 1 mmol/L (ref 0.0–2.0)
Bicarbonate: 26 mmol/L (ref 20.0–28.0)
Calcium, Ion: 0.99 mmol/L — ABNORMAL LOW (ref 1.15–1.40)
HEMATOCRIT: 24 % — AB (ref 39.0–52.0)
Hemoglobin: 8.2 g/dL — ABNORMAL LOW (ref 13.0–17.0)
O2 SAT: 95 %
PCO2 ART: 43.4 mmHg (ref 32.0–48.0)
PO2 ART: 75 mmHg — AB (ref 83.0–108.0)
POTASSIUM: 4.1 mmol/L (ref 3.5–5.1)
Patient temperature: 36.7
Sodium: 144 mmol/L (ref 135–145)
TCO2: 27 mmol/L (ref 22–32)
pH, Arterial: 7.384 (ref 7.350–7.450)

## 2018-09-02 LAB — CBC
HEMATOCRIT: 32.6 % — AB (ref 39.0–52.0)
HEMOGLOBIN: 10.7 g/dL — AB (ref 13.0–17.0)
MCH: 32.5 pg (ref 26.0–34.0)
MCHC: 32.8 g/dL (ref 30.0–36.0)
MCV: 99.1 fL (ref 78.0–100.0)
Platelets: 418 10*3/uL — ABNORMAL HIGH (ref 150–400)
RBC: 3.29 MIL/uL — AB (ref 4.22–5.81)
RDW: 12.4 % (ref 11.5–15.5)
WBC: 7.9 10*3/uL (ref 4.0–10.5)

## 2018-09-02 LAB — LACTIC ACID, PLASMA
LACTIC ACID, VENOUS: 0.6 mmol/L (ref 0.5–1.9)
LACTIC ACID, VENOUS: 0.6 mmol/L (ref 0.5–1.9)

## 2018-09-02 LAB — PROCALCITONIN: Procalcitonin: 0.16 ng/mL

## 2018-09-02 LAB — SURGICAL PCR SCREEN
MRSA, PCR: NEGATIVE
STAPHYLOCOCCUS AUREUS: NEGATIVE

## 2018-09-02 SURGERY — VIDEO ASSISTED THORACOSCOPY (VATS)/THOROCOTOMY
Anesthesia: General | Site: Chest | Laterality: Right

## 2018-09-02 MED ORDER — ONDANSETRON HCL 4 MG/2ML IJ SOLN: 4.0000 mg | Freq: Once | INTRAMUSCULAR | Status: DC | PRN

## 2018-09-02 MED ORDER — LACTATED RINGERS IV SOLN
INTRAVENOUS | Status: DC | PRN
  Administered 2018-09-02: 10:00:00 via INTRAVENOUS

## 2018-09-02 MED ORDER — SUGAMMADEX SODIUM 500 MG/5ML IV SOLN
INTRAVENOUS | Status: DC | PRN
  Administered 2018-09-02: 400 mg via INTRAVENOUS

## 2018-09-02 MED ORDER — SODIUM CHLORIDE 0.9 % IV SOLN
INTRAVENOUS | Status: DC | PRN
  Administered 2018-09-02: 60 mL

## 2018-09-02 MED ORDER — ONDANSETRON HCL 4 MG/2ML IJ SOLN: 4.0000 mg | Freq: Four times a day (QID) | INTRAMUSCULAR | Status: DC | PRN

## 2018-09-02 MED ORDER — SODIUM CHLORIDE 0.9 % IJ SOLN
INTRAMUSCULAR | Status: AC
  Filled 2018-09-02: qty 10

## 2018-09-02 MED ORDER — DIPHENHYDRAMINE HCL 50 MG/ML IJ SOLN: 12.5000 mg | Freq: Four times a day (QID) | INTRAMUSCULAR | Status: DC | PRN

## 2018-09-02 MED ORDER — ALBUTEROL SULFATE (2.5 MG/3ML) 0.083% IN NEBU
2.5000 mg | INHALATION_SOLUTION | RESPIRATORY_TRACT | Status: DC
  Administered 2018-09-02 – 2018-09-03 (×2): 2.5 mg via RESPIRATORY_TRACT
  Filled 2018-09-02 (×2): qty 3

## 2018-09-02 MED ORDER — BISACODYL 5 MG PO TBEC
10.0000 mg | DELAYED_RELEASE_TABLET | Freq: Every day | ORAL | Status: DC
  Administered 2018-09-03 – 2018-09-07 (×4): 10 mg via ORAL
  Filled 2018-09-02 (×5): qty 2

## 2018-09-02 MED ORDER — NALOXONE HCL 0.4 MG/ML IJ SOLN: 0.4000 mg | INTRAMUSCULAR | Status: DC | PRN

## 2018-09-02 MED ORDER — ACETAMINOPHEN 500 MG PO TABS
1000.0000 mg | ORAL_TABLET | Freq: Four times a day (QID) | ORAL | Status: AC
  Administered 2018-09-02 – 2018-09-07 (×19): 1000 mg via ORAL
  Filled 2018-09-02 (×20): qty 2

## 2018-09-02 MED ORDER — POTASSIUM CHLORIDE 10 MEQ/50ML IV SOLN
10.0000 meq | Freq: Every day | INTRAVENOUS | Status: DC | PRN
  Filled 2018-09-02: qty 50

## 2018-09-02 MED ORDER — DEXAMETHASONE SODIUM PHOSPHATE 10 MG/ML IJ SOLN
INTRAMUSCULAR | Status: AC
  Filled 2018-09-02: qty 1

## 2018-09-02 MED ORDER — ROCURONIUM BROMIDE 50 MG/5ML IV SOSY
PREFILLED_SYRINGE | INTRAVENOUS | Status: AC
  Filled 2018-09-02: qty 10

## 2018-09-02 MED ORDER — LIDOCAINE 2% (20 MG/ML) 5 ML SYRINGE
INTRAMUSCULAR | Status: AC
  Filled 2018-09-02: qty 5

## 2018-09-02 MED ORDER — BUPIVACAINE HCL (PF) 0.5 % IJ SOLN
INTRAMUSCULAR | Status: DC | PRN
  Administered 2018-09-02: 30 mL

## 2018-09-02 MED ORDER — FENTANYL CITRATE (PF) 100 MCG/2ML IJ SOLN
INTRAMUSCULAR | Status: AC
  Administered 2018-09-02: 50 ug via INTRAVENOUS
  Filled 2018-09-02: qty 2

## 2018-09-02 MED ORDER — BUPIVACAINE LIPOSOME 1.3 % IJ SUSP
20.0000 mL | INTRAMUSCULAR | Status: DC
  Filled 2018-09-02: qty 20

## 2018-09-02 MED ORDER — 0.9 % SODIUM CHLORIDE (POUR BTL) OPTIME
TOPICAL | Status: DC | PRN
  Administered 2018-09-02: 1000 mL

## 2018-09-02 MED ORDER — MIDAZOLAM HCL 2 MG/2ML IJ SOLN
INTRAMUSCULAR | Status: AC
  Filled 2018-09-02: qty 2

## 2018-09-02 MED ORDER — DIPHENHYDRAMINE HCL 12.5 MG/5ML PO ELIX
12.5000 mg | ORAL_SOLUTION | Freq: Four times a day (QID) | ORAL | Status: DC | PRN
  Filled 2018-09-02: qty 5

## 2018-09-02 MED ORDER — DEXAMETHASONE SODIUM PHOSPHATE 10 MG/ML IJ SOLN
INTRAMUSCULAR | Status: DC | PRN
  Administered 2018-09-02: 10 mg via INTRAVENOUS

## 2018-09-02 MED ORDER — SUGAMMADEX SODIUM 500 MG/5ML IV SOLN
INTRAVENOUS | Status: AC
  Filled 2018-09-02: qty 5

## 2018-09-02 MED ORDER — OXYCODONE HCL 5 MG/5ML PO SOLN: 5.0000 mg | Freq: Once | ORAL | Status: DC | PRN

## 2018-09-02 MED ORDER — SODIUM CHLORIDE 0.9 % IV SOLN
INTRAVENOUS | Status: DC
  Administered 2018-09-02: 16:00:00 via INTRAVENOUS

## 2018-09-02 MED ORDER — PROPOFOL 10 MG/ML IV BOLUS
INTRAVENOUS | Status: AC
  Filled 2018-09-02: qty 20

## 2018-09-02 MED ORDER — PANTOPRAZOLE SODIUM 40 MG PO TBEC
40.0000 mg | DELAYED_RELEASE_TABLET | Freq: Every day | ORAL | Status: DC
  Administered 2018-09-03 – 2018-09-07 (×5): 40 mg via ORAL
  Filled 2018-09-02 (×5): qty 1

## 2018-09-02 MED ORDER — PROPOFOL 10 MG/ML IV BOLUS
INTRAVENOUS | Status: DC | PRN
  Administered 2018-09-02: 200 mg via INTRAVENOUS

## 2018-09-02 MED ORDER — LIDOCAINE 2% (20 MG/ML) 5 ML SYRINGE
INTRAMUSCULAR | Status: DC | PRN
  Administered 2018-09-02: 100 mg via INTRAVENOUS

## 2018-09-02 MED ORDER — FENTANYL CITRATE (PF) 250 MCG/5ML IJ SOLN
INTRAMUSCULAR | Status: AC
  Filled 2018-09-02: qty 5

## 2018-09-02 MED ORDER — ROCURONIUM BROMIDE 10 MG/ML (PF) SYRINGE
PREFILLED_SYRINGE | INTRAVENOUS | Status: DC | PRN
  Administered 2018-09-02: 20 mg via INTRAVENOUS
  Administered 2018-09-02 (×2): 50 mg via INTRAVENOUS

## 2018-09-02 MED ORDER — FENTANYL CITRATE (PF) 100 MCG/2ML IJ SOLN
25.0000 ug | INTRAMUSCULAR | Status: DC | PRN
  Administered 2018-09-02 (×2): 50 ug via INTRAVENOUS

## 2018-09-02 MED ORDER — LACTATED RINGERS IV SOLN
INTRAVENOUS | Status: DC | PRN
  Administered 2018-09-02 (×2): via INTRAVENOUS

## 2018-09-02 MED ORDER — OXYCODONE HCL 5 MG PO TABS: 5.0000 mg | ORAL_TABLET | Freq: Once | ORAL | Status: DC | PRN

## 2018-09-02 MED ORDER — MIDAZOLAM HCL 5 MG/5ML IJ SOLN
INTRAMUSCULAR | Status: DC | PRN
  Administered 2018-09-02: 2 mg via INTRAVENOUS

## 2018-09-02 MED ORDER — BUPIVACAINE HCL (PF) 0.5 % IJ SOLN
INTRAMUSCULAR | Status: AC
  Filled 2018-09-02: qty 30

## 2018-09-02 MED ORDER — SODIUM CHLORIDE 0.9% FLUSH: 9.0000 mL | INTRAVENOUS | Status: DC | PRN

## 2018-09-02 MED ORDER — ACETAMINOPHEN 160 MG/5ML PO SOLN
1000.0000 mg | Freq: Four times a day (QID) | ORAL | Status: AC
  Filled 2018-09-02: qty 40.6

## 2018-09-02 MED ORDER — KETOROLAC TROMETHAMINE 30 MG/ML IJ SOLN
30.0000 mg | Freq: Three times a day (TID) | INTRAMUSCULAR | Status: AC
  Administered 2018-09-02 – 2018-09-04 (×6): 30 mg via INTRAVENOUS
  Filled 2018-09-02 (×6): qty 1

## 2018-09-02 MED ORDER — FENTANYL 40 MCG/ML IV SOLN
INTRAVENOUS | Status: DC
  Administered 2018-09-02: 15:00:00 via INTRAVENOUS
  Administered 2018-09-02: 5 ug via INTRAVENOUS
  Administered 2018-09-02: 105 ug via INTRAVENOUS
  Administered 2018-09-02: 90 ug via INTRAVENOUS
  Administered 2018-09-03: 45 ug via INTRAVENOUS
  Administered 2018-09-03: 25 ug via INTRAVENOUS
  Administered 2018-09-03: 1000 ug via INTRAVENOUS
  Administered 2018-09-03: 135 ug via INTRAVENOUS
  Administered 2018-09-03: 30 ug via INTRAVENOUS
  Administered 2018-09-04: 1.88 ug via INTRAVENOUS
  Administered 2018-09-04: 8 ug via INTRAVENOUS
  Administered 2018-09-04: 3.75 ug via INTRAVENOUS
  Filled 2018-09-02 (×3): qty 25

## 2018-09-02 MED ORDER — SODIUM CHLORIDE 0.9 % IV BOLUS
2500.0000 mL | Freq: Once | INTRAVENOUS | Status: AC
  Administered 2018-09-02: 2500 mL via INTRAVENOUS

## 2018-09-02 MED ORDER — SODIUM CHLORIDE 0.9 % IV BOLUS: 2000.0000 mL | Freq: Once | INTRAVENOUS | Status: DC

## 2018-09-02 MED ORDER — SENNOSIDES-DOCUSATE SODIUM 8.6-50 MG PO TABS
1.0000 | ORAL_TABLET | Freq: Every day | ORAL | Status: DC
  Administered 2018-09-02 – 2018-09-06 (×4): 1 via ORAL
  Filled 2018-09-02 (×5): qty 1

## 2018-09-02 MED ORDER — ONDANSETRON HCL 4 MG/2ML IJ SOLN
INTRAMUSCULAR | Status: AC
  Filled 2018-09-02: qty 2

## 2018-09-02 MED ORDER — FENTANYL CITRATE (PF) 100 MCG/2ML IJ SOLN
INTRAMUSCULAR | Status: DC | PRN
  Administered 2018-09-02 (×2): 50 ug via INTRAVENOUS
  Administered 2018-09-02: 100 ug via INTRAVENOUS
  Administered 2018-09-02 (×2): 50 ug via INTRAVENOUS

## 2018-09-02 SURGICAL SUPPLY — 89 items
APPLICATOR COTTON TIP 6 STRL (MISCELLANEOUS) IMPLANT
APPLICATOR COTTON TIP 6IN STRL (MISCELLANEOUS) ×3 IMPLANT
APPLIER CLIP ROT 10 11.4 M/L (STAPLE)
BLADE SURG 11 STRL SS (BLADE) ×1 IMPLANT
CANISTER SUCT 3000ML PPV (MISCELLANEOUS) ×3 IMPLANT
CATH THORACIC 28FR (CATHETERS) IMPLANT
CATH THORACIC 28FR RT ANG (CATHETERS) IMPLANT
CATH THORACIC 36FR (CATHETERS) IMPLANT
CATH THORACIC 36FR RT ANG (CATHETERS) IMPLANT
CLIP APPLIE ROT 10 11.4 M/L (STAPLE) IMPLANT
CLIP VESOCCLUDE MED 6/CT (CLIP) ×3 IMPLANT
CONN ST 1/4X3/8  BEN (MISCELLANEOUS) ×2
CONN ST 1/4X3/8 BEN (MISCELLANEOUS) IMPLANT
CONN Y 3/8X3/8X3/8  BEN (MISCELLANEOUS)
CONN Y 3/8X3/8X3/8 BEN (MISCELLANEOUS) IMPLANT
CONT SPEC 4OZ CLIKSEAL STRL BL (MISCELLANEOUS) ×9 IMPLANT
DERMABOND ADVANCED (GAUZE/BANDAGES/DRESSINGS) ×1
DERMABOND ADVANCED .7 DNX12 (GAUZE/BANDAGES/DRESSINGS) IMPLANT
DRAIN CHANNEL 28F RND 3/8 FF (WOUND CARE) IMPLANT
DRAIN CHANNEL 32F RND 10.7 FF (WOUND CARE) ×2 IMPLANT
DRAPE LAPAROSCOPIC ABDOMINAL (DRAPES) ×3 IMPLANT
DRAPE WARM FLUID 44X44 (DRAPE) ×3 IMPLANT
ELECT BLADE 6.5 EXT (BLADE) ×3 IMPLANT
ELECT REM PT RETURN 9FT ADLT (ELECTROSURGICAL) ×3
ELECTRODE REM PT RTRN 9FT ADLT (ELECTROSURGICAL) ×2 IMPLANT
GAUZE SPONGE 4X4 12PLY STRL (GAUZE/BANDAGES/DRESSINGS) ×3 IMPLANT
GLOVE BIOGEL M 6.5 STRL (GLOVE) ×2 IMPLANT
GLOVE BIOGEL M STRL SZ7.5 (GLOVE) ×1 IMPLANT
GLOVE BIOGEL PI IND STRL 6.5 (GLOVE) IMPLANT
GLOVE BIOGEL PI INDICATOR 6.5 (GLOVE) ×3
GLOVE SURG SIGNA 7.5 PF LTX (GLOVE) ×6 IMPLANT
GOWN STRL REUS W/ TWL LRG LVL3 (GOWN DISPOSABLE) ×4 IMPLANT
GOWN STRL REUS W/ TWL XL LVL3 (GOWN DISPOSABLE) ×2 IMPLANT
GOWN STRL REUS W/TWL LRG LVL3 (GOWN DISPOSABLE) ×3
GOWN STRL REUS W/TWL XL LVL3 (GOWN DISPOSABLE) ×2
HEMOSTAT SURGICEL 2X14 (HEMOSTASIS) IMPLANT
KIT BASIN OR (CUSTOM PROCEDURE TRAY) ×3 IMPLANT
KIT SUCTION CATH 14FR (SUCTIONS) ×3 IMPLANT
KIT TURNOVER KIT B (KITS) ×3 IMPLANT
NDL HYPO 25GX1X1/2 BEV (NEEDLE) IMPLANT
NDL SPNL 18GX3.5 QUINCKE PK (NEEDLE) IMPLANT
NDL SPNL 22GX3.5 QUINCKE BK (NEEDLE) ×2 IMPLANT
NEEDLE HYPO 25GX1X1/2 BEV (NEEDLE) ×3 IMPLANT
NEEDLE SPNL 18GX3.5 QUINCKE PK (NEEDLE) ×3 IMPLANT
NEEDLE SPNL 22GX3.5 QUINCKE BK (NEEDLE) ×3 IMPLANT
NS IRRIG 1000ML POUR BTL (IV SOLUTION) ×8 IMPLANT
PACK CHEST (CUSTOM PROCEDURE TRAY) ×3 IMPLANT
PAD ARMBOARD 7.5X6 YLW CONV (MISCELLANEOUS) ×6 IMPLANT
POUCH ENDO CATCH II 15MM (MISCELLANEOUS) IMPLANT
POUCH SPECIMEN RETRIEVAL 10MM (ENDOMECHANICALS) IMPLANT
SCISSORS ENDO CVD 5DCS (MISCELLANEOUS) IMPLANT
SEALANT PROGEL (MISCELLANEOUS) IMPLANT
SEALANT SURG COSEAL 4ML (VASCULAR PRODUCTS) IMPLANT
SEALANT SURG COSEAL 8ML (VASCULAR PRODUCTS) IMPLANT
SHEARS HARMONIC HDI 20CM (ELECTROSURGICAL) IMPLANT
SOLUTION ANTI FOG 6CC (MISCELLANEOUS) ×3 IMPLANT
SPONGE INTESTINAL PEANUT (DISPOSABLE) ×5 IMPLANT
SPONGE LAP 18X18 X RAY DECT (DISPOSABLE) ×1 IMPLANT
SPONGE TONSIL TAPE 1 RFD (DISPOSABLE) ×3 IMPLANT
SUT PROLENE 4 0 RB 1 (SUTURE)
SUT PROLENE 4-0 RB1 .5 CRCL 36 (SUTURE) IMPLANT
SUT SILK  1 MH (SUTURE) ×3
SUT SILK 1 MH (SUTURE) ×4 IMPLANT
SUT SILK 1 TIES 10X30 (SUTURE) ×3 IMPLANT
SUT SILK 2 0 SH (SUTURE) IMPLANT
SUT SILK 2 0SH CR/8 30 (SUTURE) IMPLANT
SUT SILK 3 0 SH 30 (SUTURE) IMPLANT
SUT SILK 3 0SH CR/8 30 (SUTURE) IMPLANT
SUT VIC AB 1 CTX 36 (SUTURE) ×2
SUT VIC AB 1 CTX36XBRD ANBCTR (SUTURE) ×2 IMPLANT
SUT VIC AB 2-0 CTX 36 (SUTURE) ×4 IMPLANT
SUT VIC AB 2-0 UR6 27 (SUTURE) IMPLANT
SUT VIC AB 3-0 MH 27 (SUTURE) IMPLANT
SUT VIC AB 3-0 X1 27 (SUTURE) ×3 IMPLANT
SUT VICRYL 2 TP 1 (SUTURE) IMPLANT
SWAB CULTURE ESWAB REG 1ML (MISCELLANEOUS) IMPLANT
SYR 10ML LL (SYRINGE) IMPLANT
SYR 30ML LL (SYRINGE) ×1 IMPLANT
SYSTEM SAHARA CHEST DRAIN ATS (WOUND CARE) ×3 IMPLANT
TAPE CLOTH 4X10 WHT NS (GAUZE/BANDAGES/DRESSINGS) ×3 IMPLANT
TAPE CLOTH SURG 4X10 WHT LF (GAUZE/BANDAGES/DRESSINGS) ×1 IMPLANT
TIP APPLICATOR SPRAY EXTEND 16 (VASCULAR PRODUCTS) IMPLANT
TOWEL GREEN STERILE (TOWEL DISPOSABLE) ×3 IMPLANT
TOWEL GREEN STERILE FF (TOWEL DISPOSABLE) ×3 IMPLANT
TRAP SPECIMEN MUCOUS 40CC (MISCELLANEOUS) ×3 IMPLANT
TRAY FOLEY MTR SLVR 16FR STAT (SET/KITS/TRAYS/PACK) ×3 IMPLANT
TROCAR XCEL BLADELESS 5X75MML (TROCAR) ×3 IMPLANT
TUNNELER SHEATH ON-Q 16GX12 DP (PAIN MANAGEMENT) IMPLANT
WATER STERILE IRR 1000ML POUR (IV SOLUTION) ×3 IMPLANT

## 2018-09-02 NOTE — Op Note (Signed)
NAME: Roy [name], Roy L. MEDICAL RECORD UJ:[id number] ACCOUNT [id number] DATE OF BIRTH:1971/06/25 FACILITY: [facility] LOCATION: [facility] PHYSICIAN:Brynlee Pennywell C. Ketzia Guzek, MD  OPERATIVE REPORT  DATE OF PROCEDURE:  09/02/2018  PREOPERATIVE DIAGNOSIS:  Right empyema.  POSTOPERATIVE DIAGNOSIS:  Early organizing empyema.  PROCEDURES:   Right video-assisted thoracoscopy, Drainage of empyema, Visceral and parietal decortication.  SURGEON:  Modesto Charon, MD  ASSISTANT:  Jadene Pierini, PA-C  ANESTHESIA:  General.  FINDINGS:  Early organizing empyema with multiple loculated fluid collections.  Early fibrinous peel on lung.  Good reexpansion of all 3 lobes.  CLINICAL NOTE:  The patient is a 47 year old previously healthy man who about 3 weeks ago developed a respiratory infection.  He was treated with antibiotics, but developed a pleural effusion.  He had thoracentesis which incompletely drained the effusion.   A pigtail catheter was placed, but also never completely drained the effusion.  He was advised to undergo right VATS for drainage of the empyema and decortication of the lung.  The indications, risks, benefits, and alternatives were discussed in detail  with the patient.  He understood and accepted the risks and agreed to proceed.  DESCRIPTION OF PROCEDURE:  The patient was brought to the preoperative holding area on 09/02/2018.  Anesthesia established adequate intravenous access and placed an arterial blood pressure monitoring line.  He was taken to the operating room,  anesthetized and intubated with a double lumen endotracheal tube.  A Foley catheter was placed.  Sequential compression devices were placed on the calves for DVT prophylaxis.  Intravenous antibiotics had already been administered.  He was placed in a  left lateral decubitus position. The pleural catheter was removed and the right chest was prepped and draped in the usual sterile fashion.  Single lung ventilation of the  left lung was initiated and was tolerated well throughout the procedure.  A time out was performed. An incision was made in the seventh interspace in the midaxillary line.  Prior to making the incision, the area was anesthetized with a solution containing 20 mL of liposomal bupivacaine and 30 mL of 0.5% bupivacaine, which was then diluted with saline.   The incision then was made.  The pleural space was entered.  A sucker was placed into the chest.  Specimens were sent for culture and cytology.  Approximately a liter of murky fluid was evacuated.  A 5 mm port was inserted and the thoracoscope was advanced into  the chest.  There was an early organizing empyema with multiple loculated fluid collections.  There was no frank pus.  A working incision then was made in the fifth interspace anterolaterally.  This incision also was infiltrated with the liposomal  bupivacaine solution prior to making the incision.  No rib spreading was performed during the procedure.  The loculations were broken up and the lung was freed up circumferentially.  Posteriorly, there was some frank, more gelatinous debris.  Superiorly,  there was a clear fluid.  The lung was relatively adherent to the diaphragmatic surface and this was freed up last.  The lung was encased in a pleural peel.  There was a moderate parietal pleural peel.  The tissue was friable and bled easily.  The  fissure was entered and murky fluid was evacuated from that as well.  The middle lower lobe and upper lobe then were decorticated.  The peel was 1-2 mm thick in most areas and came off relatively easily, although it was difficult in some areas to start a  plane.  There  were a few minor visceral pleural tears, but no major tears into the parenchyma.  The chest was copiously irrigated with warm saline.  A test inflation showed good reexpansion of all 3 lobes.  The parietal decortication then was  completed.  There was some bleeding with this.  The chest was again  irrigated.  A 32 [place] Blake drain was placed through the original port incision and directed posteriorly.  The area for another chest tube site was anesthetized with the bupivacaine  solution and an incision was made and a 32-French Blake drain was placed anterolaterally as well.  Both were secured with #1 silk sutures.  The working incision was closed in standard fashion in 3 layers.  The chest tube was placed to suction.  The  patient was placed back in supine position.    He was extubated in the operating room and taken to the [facility] in good condition.  AN/NUANCE  D:09/02/2018 T:09/02/2018 JOB:[id number]

## 2018-09-02 NOTE — Progress Notes (Signed)
Patient ID: Roy [name], male   DOB: 06-May-1971, 47 y.o.   MRN: [id number] EVENING ROUNDS NOTE :     [address]       ,[place] [address]             [phone]                 Day of Surgery Procedure(s) (LRB): VIDEO ASSISTED THORACOSCOPY (VATS)/possible THOROCOTOMY (Right) DECORTICATION (Right) DRAINAGE OF PLEURAL EFFUSION (Right)  Total Length of Stay:  LOS: 1 day  BP (!) 169/79   Pulse 72   Temp 98.8 F (37.1 C) (Axillary)   Resp 17   Ht 6\' 3"  (1.905 m)   Wt 108.4 kg   SpO2 100%   BMI 29.86 kg/m   .Intake/Output      09/19 0701 - 09/20 0700 09/20 0701 - 09/21 0700   P.O. 300 240   I.V. (mL/kg) 355.5 (3.3) 2099.2 (19.4)   IV Piggyback 678.1 341.1   Total Intake(mL/kg) 1333.7 (12.3) 2680.3 (24.7)   Urine (mL/kg/hr) 800 1000 (0.9)   Blood  300   Chest Tube 6 310   Total Output 806 1610   Net +527.7 +1070.3        Urine Occurrence 700 x      . sodium chloride 125 mL/hr at 09/02/18 1600  . ceFEPime (MAXIPIME) IV Stopped (09/02/18 0707)  . potassium chloride    . vancomycin 1,250 mg (09/02/18 1727)     Lab Results  Component Value Date   WBC 7.9 09/02/2018   HGB 8.2 (L) 09/02/2018   HCT 24.0 (L) 09/02/2018   PLT 418 (H) 09/02/2018   GLUCOSE 111 (H) 09/02/2018   ALT 147 (H) 09/02/2018   AST 73 (H) 09/02/2018   NA 144 09/02/2018   K 4.1 09/02/2018   CL 102 09/02/2018   CREATININE 0.74 09/02/2018   BUN 9 09/02/2018   CO2 26 09/02/2018   INR 1.19 09/01/2018   HGBA1C 6.3 (H) 08/18/2018   Stable  Low chest tube drainage   Grace Isaac MD  Beeper [phone] Office [phone] 09/02/2018 5:42 PM

## 2018-09-02 NOTE — H&P (View-Only) (Signed)
Reason for Consult:Right empyema Referring Physician: TH  Roy [name] is an 47 y.o. male.  HPI:   Roy [name] is a 47 yo man with history of tobacco and marijuana use, vaping, hypertension and coiling of cerebral aneurysm. He became ill about 3 weeks ago, diagnosed with pneumonia. Treated with antibiotics but developed a right parapneumonic effusion. Thoracentesis showed an exudate. Loculated effusion not drained by pigtail catheter. Despite percutaneous drainage, he has persistent fever, loculated fluid and pain. Transferred to [facility] for further management.  Past Medical History:  Diagnosis Date  . Aneurysm ([place])   . Cardiomyopathy ([place])    pt sts he no longer has it  . Hypertension   . Renal calculi   . Tobacco abuse     Past Surgical History:  Procedure Laterality Date  . ANEURYSM COILING     "a few years ago"    Family History  Problem Relation Age of Onset  . Lung cancer Father   . Throat cancer Father     Social History:  reports that he has quit smoking. His smoking use included cigarettes. He has never used smokeless tobacco. He reports that he has current or past drug history. Drug: Marijuana. His alcohol history is not on file.  Allergies: No Known Allergies  Medications:  Scheduled: . sodium chloride   Intravenous Once  . lisinopril  5 mg Oral Daily    Results for orders placed or performed during the hospital encounter of 09/01/18 (from the past 48 hour(s))  Type and screen [place]     Status: None   Collection Time: 09/01/18  8:52 PM  Result Value Ref Range   ABO/RH(D) O POS    Antibody Screen NEG    Sample Expiration      09/04/2018 Performed at [facility], [place] [address], [place], [place] [address]   ABO/Rh     Status: None   Collection Time: 09/01/18  8:52 PM  Result Value Ref Range   ABO/RH(D)      O POS Performed at [facility] [address], [place], [place] [address]   Culture, blood (Routine X 2) w  Reflex to ID Panel     Status: None (Preliminary result)   Collection Time: 09/01/18  9:50 PM  Result Value Ref Range   Specimen Description BLOOD LEFT ANTECUBITAL    Special Requests      BOTTLES DRAWN AEROBIC AND ANAEROBIC Blood Culture adequate volume   Culture      NO GROWTH < 12 HOURS Performed at [facility], [place] [address] [place], [place] [address]    Report Status PENDING   Culture, blood (Routine X 2) w Reflex to ID Panel     Status: None (Preliminary result)   Collection Time: 09/01/18  9:52 PM  Result Value Ref Range   Specimen Description BLOOD RIGHT HAND    Special Requests      BOTTLES DRAWN AEROBIC ONLY Blood Culture adequate volume   Culture      NO GROWTH < 12 HOURS Performed at [facility] [address], [place], [place] [address]    Report Status PENDING   Protime-INR     Status: None   Collection Time: 09/01/18  9:52 PM  Result Value Ref Range   Prothrombin Time 15.0 11.4 - 15.2 seconds   INR 1.19     Comment: Performed at [facility], [place] [address], [place], [place]  [address]  APTT     Status: Abnormal   Collection Time: 09/01/18  9:52 PM  Result Value Ref Range   aPTT 39 (H) 24 - 36 seconds    Comment:        IF BASELINE aPTT IS ELEVATED, SUGGEST PATIENT RISK ASSESSMENT BE USED TO DETERMINE APPROPRIATE ANTICOAGULANT THERAPY. Performed at [facility], [place] [address] [place], [place] [address]   CBC     Status: Abnormal   Collection Time: 09/02/18  4:03 AM  Result Value Ref Range   WBC 7.9 4.0 - 10.5 K/uL   RBC 3.29 (L) 4.22 - 5.81 MIL/uL   Hemoglobin 10.7 (L) 13.0 - 17.0 g/dL   HCT 32.6 (L) 39.0 - 52.0 %   MCV 99.1 78.0 - 100.0 fL   MCH 32.5 26.0 - 34.0 pg   MCHC 32.8 30.0 - 36.0 g/dL   RDW 12.4 11.5 - 15.5 %   Platelets 418 (H) 150 - 400 K/uL    Comment: Performed at [facility], [place] [address], [place], [place] [address]  Comprehensive metabolic panel     Status: Abnormal   Collection Time: 09/02/18   4:03 AM  Result Value Ref Range   Sodium 138 135 - 145 mmol/L   Potassium 4.4 3.5 - 5.1 mmol/L   Chloride 102 98 - 111 mmol/L   CO2 26 22 - 32 mmol/L   Glucose, Bld 111 (H) 70 - 99 mg/dL   BUN 9 6 - 20 mg/dL   Creatinine, Ser 0.74 0.61 - 1.24 mg/dL   Calcium 8.0 (L) 8.9 - 10.3 mg/dL   Total Protein 6.0 (L) 6.5 - 8.1 g/dL   Albumin 1.8 (L) 3.5 - 5.0 g/dL   AST 73 (H) 15 - 41 U/L   ALT 147 (H) 0 - 44 U/L   Alkaline Phosphatase 119 38 - 126 U/L   Total Bilirubin 0.4 0.3 - 1.2 mg/dL   GFR calc non Af Amer >60 >60 mL/min   GFR calc Af Amer >60 >60 mL/min    Comment: (NOTE) The eGFR has been calculated using the CKD EPI equation. This calculation has not been validated in all clinical situations. eGFR's persistently <60 mL/min signify possible Chronic Kidney Disease.    Anion gap 10 5 - 15    Comment: Performed at [facility] [address] [place], [place] [address]  Lactic acid, plasma     Status: None   Collection Time: 09/02/18  4:03 AM  Result Value Ref Range   Lactic Acid, Venous 0.6 0.5 - 1.9 mmol/L    Comment: Performed at [facility] [address] [place], [place] [address]  Procalcitonin     Status: None   Collection Time: 09/02/18  4:03 AM  Result Value Ref Range   Procalcitonin 0.16 ng/mL    Comment:        Interpretation: PCT (Procalcitonin) <= 0.5 ng/mL: Systemic infection (sepsis) is not likely. Local bacterial infection is possible. (NOTE)       Sepsis PCT Algorithm           Lower Respiratory Tract                                      Infection PCT Algorithm    ----------------------------     ----------------------------         PCT < 0.25 ng/mL  PCT < 0.10 ng/mL         Strongly encourage             Strongly discourage   discontinuation of antibiotics    initiation of antibiotics    ----------------------------     -----------------------------       PCT 0.25 - 0.50 ng/mL            PCT 0.10 - 0.25 ng/mL                OR       >80% decrease in PCT            Discourage initiation of                                            antibiotics      Encourage discontinuation           of antibiotics    ----------------------------     -----------------------------         PCT >= 0.50 ng/mL              PCT 0.26 - 0.50 ng/mL               AND        <80% decrease in PCT             Encourage initiation of                                             antibiotics       Encourage continuation           of antibiotics    ----------------------------     -----------------------------        PCT >= 0.50 ng/mL                  PCT > 0.50 ng/mL               AND         increase in PCT                  Strongly encourage                                      initiation of antibiotics    Strongly encourage escalation           of antibiotics                                     -----------------------------                                           PCT <= 0.25 ng/mL                                                 OR                                        >  80% decrease in PCT                                     Discontinue / Do not initiate                                             antibiotics Performed at [facility], [place] [address] [place], [place] [address]   Surgical PCR screen     Status: None   Collection Time: 09/02/18  5:30 AM  Result Value Ref Range   MRSA, PCR NEGATIVE NEGATIVE   Staphylococcus aureus NEGATIVE NEGATIVE    Comment: (NOTE) The Xpert SA Assay (FDA approved for NASAL specimens in patients 72 years of age and older), is one component of a comprehensive surveillance program. It is not intended to diagnose infection nor to guide or monitor treatment. Performed at [facility], [place] [address], [place], [place] [address]   Lactic acid, plasma     Status: None   Collection Time: 09/02/18  7:42 AM  Result Value Ref Range   Lactic Acid, Venous 0.6 0.5 - 1.9 mmol/L     Comment: Performed at [facility] [address], [place], [place] [address]    Dg Chest Port 1 View  Result Date: 09/01/2018 CLINICAL DATA:  Recurrent right pleural effusion. Recently treated for pneumonia now with fever spikes. History of smoking, cardiomyopathy. EXAM: PORTABLE CHEST 1 VIEW COMPARISON:  Portable chest x-ray of August 28, 2018 FINDINGS: There has been interval placement of a pigtail catheter in the lower right pleural space with decreased volume of the right pleural effusion. Considerable fluid persists however especially in the lateral aspect of the hemithorax. Improved aeration of the right lung is seen. There are coarse lung markings at the right base. The left lung is well-expanded. The interstitial markings are mildly prominent though stable. The heart is top-normal in size. There is no significant pulmonary vascular congestion. IMPRESSION: Slight interval decrease in the volume of the right pleural effusion. There may refer there remains considerable pleural fluid on the right which may be loculated. Improved aeration of the right lung. Probable right basilar pneumonia. Electronically Signed   By: David  [name] M.D.   On: 09/01/2018 10:26   CT CHEST WITH CONTRAST  TECHNIQUE: Multidetector CT imaging of the chest was performed during intravenous contrast administration.  CONTRAST:  21m OMNIPAQUE IOHEXOL 300 MG/ML  SOLN  COMPARISON:  08/16/2018  FINDINGS: Cardiovascular: Heart is upper limits normal in size. Aorta is normal caliber.  Mediastinum/Nodes: Small scattered mediastinal lymph nodes, none pathologically enlarged. No mediastinal, hilar, or axillary adenopathy.  Lungs/Pleura: Enlarging right effusion, now large. Near complete atelectasis/collapse of the right lower lobe and right middle lobe. Compressive atelectasis in the posterior right upper lobe. Only a small area of right upper lobe is aerated. 1.8 cm spiculated nodule in the left upper  lobe again noted. Previously seen nodular opacity or inferiorly in the left upper lobe currently measures 8 mm on image 95 compared to 2 cm previously. Peripheral nodule measures 9 mm in the left lower lobe on image 135, better seen on today's study due to improved atelectasis at the left base.  Upper Abdomen: Imaging into the upper abdomen shows no acute findings.  Musculoskeletal: Chest wall  soft tissues are unremarkable. No acute bony abnormality.  IMPRESSION: Enlarging right pleural effusion, now large with only a small amount of aerated right upper lobe remaining. Compressive atelectasis and/or infiltrate throughout much of the right lung.  Persistent nodular opacity in the anterior lingula, 1.8 cm currently. More inferiorly in the left upper lobe, the previously measured 2 cm masslike area has improved, now 8 mm. 9 mm nodular density peripherally in the left lower lobe. Recommend continued follow-up.   Electronically Signed   By: Rolm Baptise M.D.   On: 08/30/2018 08:38  I personally reviewed the CXR and CT images and agree with above  Review of Systems  Constitutional: Positive for chills, fever and malaise/fatigue.  Respiratory: Positive for cough, sputum production and shortness of breath.   Cardiovascular: Positive for chest pain (right side pleuritic).  All other systems reviewed and are negative.  Blood pressure 136/88, pulse 71, temperature 98.4 F (36.9 C), temperature source Oral, resp. rate 20, height '6\' 3"'$  (1.905 m), weight 108.4 kg, SpO2 96 %. Physical Exam  Vitals reviewed. Constitutional: He is oriented to person, place, and time. He appears well-developed and well-nourished. No distress.  HENT:  Head: Normocephalic and atraumatic.  Mouth/Throat: No oropharyngeal exudate.  Eyes: Conjunctivae and EOM are normal. No scleral icterus.  Neck: Neck supple. No thyromegaly present.  Cardiovascular: Normal rate, regular rhythm, normal heart sounds and  intact distal pulses. Exam reveals no gallop and no friction rub.  No murmur heard. Respiratory: He is in respiratory distress (discomfort with breathing). He has no wheezes. He exhibits tenderness (right).  GI: Soft. He exhibits no distension. There is no tenderness.  Musculoskeletal: He exhibits no edema.  Lymphadenopathy:    He has no cervical adenopathy.  Neurological: He is alert and oriented to person, place, and time. No cranial nerve deficit. He exhibits normal muscle tone. Coordination normal.  Skin: Skin is warm and dry.    Assessment/Plan: 47 yo man with history of tobacco and marijuana use, vaping, hypertension and coiling of cerebral aneurysm. He became ill about 3 weeks ago, diagnosed with pneumonia. Treated with antibiotics but developed a right parapneumonic effusion. Thoracentesis showed an exudate. Loculated effusion not drained by pigtail catheter. Despite percutaneous drainage, he has persistent fever, loculated fluid and pain. I recommended we proceed with right VATS to drain the effusion and place drainage tubes.  I discussed the general nature of the procedure, the need for general anesthesia, the incisions to be used, and the need for drainage tubes postoperatively with Mr [name] and his girlfriend. We discussed the expected hospital stay, overall recovery and short and long term outcomes. I informed him of the indications, risks, benefits and alternatives. They understand the risks include, but are not limited to death, MI, DVT/PE, bleeding, possible need for transfusion, infections, prolonged air leak, as well as other organ system dysfunction including respiratory, renal, or GI complications.   He accepts the risks and agrees to proceed.  He is NPO, I have called OR and we will proceed this AM  Melrose Nakayama 09/02/2018, 9:09 AM

## 2018-09-02 NOTE — Progress Notes (Signed)
Pt's temperature 101 degree F oral . DR. Blaine Hamper notified. See mak for prn medication for fever. Fluid bolus ordered per MD. See MAR for administration. IV morphine administered for chest pain 8/10. Call light within reach. Will continue to monitor.

## 2018-09-02 NOTE — Anesthesia Procedure Notes (Signed)
Arterial Line Insertion Start/End9/20/2019 10:05 AM, 09/02/2018 10:15 AM Performed by: Carney Living, CRNA, CRNA  Patient location: Pre-op. Preanesthetic checklist: patient identified, IV checked, site marked, risks and benefits discussed and surgical consent Lidocaine 1% used for infiltration and patient sedated Left, radial was placed Catheter size: 20 G Hand hygiene performed  and maximum sterile barriers used   Attempts: 2 Procedure performed without using ultrasound guided technique. Following insertion, dressing applied and Biopatch. Post procedure assessment: unchanged and normal  Patient tolerated the procedure well with no immediate complications.

## 2018-09-02 NOTE — Interval H&P Note (Signed)
History and Physical Interval Note:  09/02/2018 10:03 AM  Roy [name]  has presented today for surgery, with the diagnosis of RIGHT PLEURAL EFFUSION  The various methods of treatment have been discussed with the patient and family. After consideration of risks, benefits and other options for treatment, the patient has consented to  Procedure(s): VIDEO ASSISTED THORACOSCOPY (VATS)/possible THOROCOTOMY (Right) DECORTICATION (Right) DRAINAGE OF PLEURAL EFFUSION (Right) as a surgical intervention .  The patient's history has been reviewed, patient examined, no change in status, stable for surgery.  I have reviewed the patient's chart and labs.  Questions were answered to the patient's satisfaction.     Melrose Nakayama

## 2018-09-02 NOTE — Consult Note (Signed)
Reason for Consult:Right empyema Referring Physician: TH  Tobey L Martinique is an 47 y.o. male.  HPI:   Mr. Martinique is a 47 yo man with history of tobacco and marijuana use, vaping, hypertension and coiling of cerebral aneurysm. He became ill about 3 weeks ago, diagnosed with pneumonia. Treated with antibiotics but developed a right parapneumonic effusion. Thoracentesis showed an exudate. Loculated effusion not drained by pigtail catheter. Despite percutaneous drainage, he has persistent fever, loculated fluid and pain. Transferred to [facility] for further management.  Past Medical History:  Diagnosis Date  . Aneurysm ([place])   . Cardiomyopathy ([place])    pt sts he no longer has it  . Hypertension   . Renal calculi   . Tobacco abuse     Past Surgical History:  Procedure Laterality Date  . ANEURYSM COILING     "a few years ago"    Family History  Problem Relation Age of Onset  . Lung cancer Father   . Throat cancer Father     Social History:  reports that he has quit smoking. His smoking use included cigarettes. He has never used smokeless tobacco. He reports that he has current or past drug history. Drug: Marijuana. His alcohol history is not on file.  Allergies: No Known Allergies  Medications:  Scheduled: . sodium chloride   Intravenous Once  . lisinopril  5 mg Oral Daily    Results for orders placed or performed during the hospital encounter of 09/01/18 (from the past 48 hour(s))  Type and screen [place]     Status: None   Collection Time: 09/01/18  8:52 PM  Result Value Ref Range   ABO/RH(D) O POS    Antibody Screen NEG    Sample Expiration      09/04/2018 Performed at [facility], [place] [address] [place] [address], [place], [place] [address]   ABO/Rh     Status: None   Collection Time: 09/01/18  8:52 PM  Result Value Ref Range   ABO/RH(D)      O POS Performed at [facility] [address], [place], [place] [address]   Culture, blood (Routine X 2) w  Reflex to ID Panel     Status: None (Preliminary result)   Collection Time: 09/01/18  9:50 PM  Result Value Ref Range   Specimen Description BLOOD LEFT ANTECUBITAL    Special Requests      BOTTLES DRAWN AEROBIC AND ANAEROBIC Blood Culture adequate volume   Culture      NO GROWTH < 12 HOURS Performed at [facility], [place] [address] [place], [place] [address]    Report Status PENDING   Culture, blood (Routine X 2) w Reflex to ID Panel     Status: None (Preliminary result)   Collection Time: 09/01/18  9:52 PM  Result Value Ref Range   Specimen Description BLOOD RIGHT HAND    Special Requests      BOTTLES DRAWN AEROBIC ONLY Blood Culture adequate volume   Culture      NO GROWTH < 12 HOURS Performed at [facility] [address] [place], [place] [address]    Report Status PENDING   Protime-INR     Status: None   Collection Time: 09/01/18  9:52 PM  Result Value Ref Range   Prothrombin Time 15.0 11.4 - 15.2 seconds   INR 1.19     Comment: Performed at [facility], [place] [address] [place], [place]  [address]  APTT     Status: Abnormal   Collection Time: 09/01/18  9:52 PM  Result Value Ref Range   aPTT 39 (H) 24 - 36 seconds    Comment:        IF BASELINE aPTT IS ELEVATED, SUGGEST PATIENT RISK ASSESSMENT BE USED TO DETERMINE APPROPRIATE ANTICOAGULANT THERAPY. Performed at [facility], [place] [address], [place], [place] [address]   CBC     Status: Abnormal   Collection Time: 09/02/18  4:03 AM  Result Value Ref Range   WBC 7.9 4.0 - 10.5 K/uL   RBC 3.29 (L) 4.22 - 5.81 MIL/uL   Hemoglobin 10.7 (L) 13.0 - 17.0 g/dL   HCT 32.6 (L) 39.0 - 52.0 %   MCV 99.1 78.0 - 100.0 fL   MCH 32.5 26.0 - 34.0 pg   MCHC 32.8 30.0 - 36.0 g/dL   RDW 12.4 11.5 - 15.5 %   Platelets 418 (H) 150 - 400 K/uL    Comment: Performed at [facility], [place] [address], [place], [place] [address]  Comprehensive metabolic panel     Status: Abnormal   Collection Time: 09/02/18   4:03 AM  Result Value Ref Range   Sodium 138 135 - 145 mmol/L   Potassium 4.4 3.5 - 5.1 mmol/L   Chloride 102 98 - 111 mmol/L   CO2 26 22 - 32 mmol/L   Glucose, Bld 111 (H) 70 - 99 mg/dL   BUN 9 6 - 20 mg/dL   Creatinine, Ser 0.74 0.61 - 1.24 mg/dL   Calcium 8.0 (L) 8.9 - 10.3 mg/dL   Total Protein 6.0 (L) 6.5 - 8.1 g/dL   Albumin 1.8 (L) 3.5 - 5.0 g/dL   AST 73 (H) 15 - 41 U/L   ALT 147 (H) 0 - 44 U/L   Alkaline Phosphatase 119 38 - 126 U/L   Total Bilirubin 0.4 0.3 - 1.2 mg/dL   GFR calc non Af Amer >60 >60 mL/min   GFR calc Af Amer >60 >60 mL/min    Comment: (NOTE) The eGFR has been calculated using the CKD EPI equation. This calculation has not been validated in all clinical situations. eGFR's persistently <60 mL/min signify possible Chronic Kidney Disease.    Anion gap 10 5 - 15    Comment: Performed at [facility] [address] [place], [place] [address]  Lactic acid, plasma     Status: None   Collection Time: 09/02/18  4:03 AM  Result Value Ref Range   Lactic Acid, Venous 0.6 0.5 - 1.9 mmol/L    Comment: Performed at [facility] [address], [place], [place] [address]  Procalcitonin     Status: None   Collection Time: 09/02/18  4:03 AM  Result Value Ref Range   Procalcitonin 0.16 ng/mL    Comment:        Interpretation: PCT (Procalcitonin) <= 0.5 ng/mL: Systemic infection (sepsis) is not likely. Local bacterial infection is possible. (NOTE)       Sepsis PCT Algorithm           Lower Respiratory Tract                                      Infection PCT Algorithm    ----------------------------     ----------------------------         PCT < 0.25 ng/mL  PCT < 0.10 ng/mL         Strongly encourage             Strongly discourage   discontinuation of antibiotics    initiation of antibiotics    ----------------------------     -----------------------------       PCT 0.25 - 0.50 ng/mL            PCT 0.10 - 0.25 ng/mL                OR       >80% decrease in PCT            Discourage initiation of                                            antibiotics      Encourage discontinuation           of antibiotics    ----------------------------     -----------------------------         PCT >= 0.50 ng/mL              PCT 0.26 - 0.50 ng/mL               AND        <80% decrease in PCT             Encourage initiation of                                             antibiotics       Encourage continuation           of antibiotics    ----------------------------     -----------------------------        PCT >= 0.50 ng/mL                  PCT > 0.50 ng/mL               AND         increase in PCT                  Strongly encourage                                      initiation of antibiotics    Strongly encourage escalation           of antibiotics                                     -----------------------------                                           PCT <= 0.25 ng/mL                                                 OR                                        >  80% decrease in PCT                                     Discontinue / Do not initiate                                             antibiotics Performed at [facility], [place] [address] [place], [place] [address]   Surgical PCR screen     Status: None   Collection Time: 09/02/18  5:30 AM  Result Value Ref Range   MRSA, PCR NEGATIVE NEGATIVE   Staphylococcus aureus NEGATIVE NEGATIVE    Comment: (NOTE) The Xpert SA Assay (FDA approved for NASAL specimens in patients 66 years of age and older), is one component of a comprehensive surveillance program. It is not intended to diagnose infection nor to guide or monitor treatment. Performed at [facility], [place] [address], [place], [place] [address]   Lactic acid, plasma     Status: None   Collection Time: 09/02/18  7:42 AM  Result Value Ref Range   Lactic Acid, Venous 0.6 0.5 - 1.9 mmol/L     Comment: Performed at [facility] [address] [place], [place] [address]    Dg Chest Port 1 View  Result Date: 09/01/2018 CLINICAL DATA:  Recurrent right pleural effusion. Recently treated for pneumonia now with fever spikes. History of smoking, cardiomyopathy. EXAM: PORTABLE CHEST 1 VIEW COMPARISON:  Portable chest x-ray of August 28, 2018 FINDINGS: There has been interval placement of a pigtail catheter in the lower right pleural space with decreased volume of the right pleural effusion. Considerable fluid persists however especially in the lateral aspect of the hemithorax. Improved aeration of the right lung is seen. There are coarse lung markings at the right base. The left lung is well-expanded. The interstitial markings are mildly prominent though stable. The heart is top-normal in size. There is no significant pulmonary vascular congestion. IMPRESSION: Slight interval decrease in the volume of the right pleural effusion. There may refer there remains considerable pleural fluid on the right which may be loculated. Improved aeration of the right lung. Probable right basilar pneumonia. Electronically Signed   By: David  Martinique M.D.   On: 09/01/2018 10:26   CT CHEST WITH CONTRAST  TECHNIQUE: Multidetector CT imaging of the chest was performed during intravenous contrast administration.  CONTRAST:  9m OMNIPAQUE IOHEXOL 300 MG/ML  SOLN  COMPARISON:  08/16/2018  FINDINGS: Cardiovascular: Heart is upper limits normal in size. Aorta is normal caliber.  Mediastinum/Nodes: Small scattered mediastinal lymph nodes, none pathologically enlarged. No mediastinal, hilar, or axillary adenopathy.  Lungs/Pleura: Enlarging right effusion, now large. Near complete atelectasis/collapse of the right lower lobe and right middle lobe. Compressive atelectasis in the posterior right upper lobe. Only a small area of right upper lobe is aerated. 1.8 cm spiculated nodule in the left upper  lobe again noted. Previously seen nodular opacity or inferiorly in the left upper lobe currently measures 8 mm on image 95 compared to 2 cm previously. Peripheral nodule measures 9 mm in the left lower lobe on image 135, better seen on today's study due to improved atelectasis at the left base.  Upper Abdomen: Imaging into the upper abdomen shows no acute findings.  Musculoskeletal: Chest wall  soft tissues are unremarkable. No acute bony abnormality.  IMPRESSION: Enlarging right pleural effusion, now large with only a small amount of aerated right upper lobe remaining. Compressive atelectasis and/or infiltrate throughout much of the right lung.  Persistent nodular opacity in the anterior lingula, 1.8 cm currently. More inferiorly in the left upper lobe, the previously measured 2 cm masslike area has improved, now 8 mm. 9 mm nodular density peripherally in the left lower lobe. Recommend continued follow-up.   Electronically Signed   By: Rolm Baptise M.D.   On: 08/30/2018 08:38  I personally reviewed the CXR and CT images and agree with above  Review of Systems  Constitutional: Positive for chills, fever and malaise/fatigue.  Respiratory: Positive for cough, sputum production and shortness of breath.   Cardiovascular: Positive for chest pain (right side pleuritic).  All other systems reviewed and are negative.  Blood pressure 136/88, pulse 71, temperature 98.4 F (36.9 C), temperature source Oral, resp. rate 20, height '6\' 3"'$  (1.905 m), weight 108.4 kg, SpO2 96 %. Physical Exam  Vitals reviewed. Constitutional: He is oriented to person, place, and time. He appears well-developed and well-nourished. No distress.  HENT:  Head: Normocephalic and atraumatic.  Mouth/Throat: No oropharyngeal exudate.  Eyes: Conjunctivae and EOM are normal. No scleral icterus.  Neck: Neck supple. No thyromegaly present.  Cardiovascular: Normal rate, regular rhythm, normal heart sounds and  intact distal pulses. Exam reveals no gallop and no friction rub.  No murmur heard. Respiratory: He is in respiratory distress (discomfort with breathing). He has no wheezes. He exhibits tenderness (right).  GI: Soft. He exhibits no distension. There is no tenderness.  Musculoskeletal: He exhibits no edema.  Lymphadenopathy:    He has no cervical adenopathy.  Neurological: He is alert and oriented to person, place, and time. No cranial nerve deficit. He exhibits normal muscle tone. Coordination normal.  Skin: Skin is warm and dry.    Assessment/Plan: 47 yo man with history of tobacco and marijuana use, vaping, hypertension and coiling of cerebral aneurysm. He became ill about 3 weeks ago, diagnosed with pneumonia. Treated with antibiotics but developed a right parapneumonic effusion. Thoracentesis showed an exudate. Loculated effusion not drained by pigtail catheter. Despite percutaneous drainage, he has persistent fever, loculated fluid and pain. I recommended we proceed with right VATS to drain the effusion and place drainage tubes.  I discussed the general nature of the procedure, the need for general anesthesia, the incisions to be used, and the need for drainage tubes postoperatively with Mr Martinique and his girlfriend. We discussed the expected hospital stay, overall recovery and short and long term outcomes. I informed him of the indications, risks, benefits and alternatives. They understand the risks include, but are not limited to death, MI, DVT/PE, bleeding, possible need for transfusion, infections, prolonged air leak, as well as other organ system dysfunction including respiratory, renal, or GI complications.   He accepts the risks and agrees to proceed.  He is NPO, I have called OR and we will proceed this AM  Melrose Nakayama 09/02/2018, 9:09 AM

## 2018-09-02 NOTE — Brief Op Note (Signed)
09/01/2018 - 09/02/2018  1:21 PM  PATIENT:  Roy [name]  47 y.o. male  PRE-OPERATIVE DIAGNOSIS:  RIGHT PLEURAL EFFUSION/EMPYEMA  POST-OPERATIVE DIAGNOSIS:  RIGHT PLEURAL EFFUSION/EMPYEMA  PROCEDURE:  Procedure(s): VIDEO ASSISTED THORACOSCOPY (VATS)/possible THOROCOTOMY (Right) DECORTICATION (Right) DRAINAGE OF PLEURAL EFFUSION (Right)  SURGEON:  Surgeon(s) and Role:    * Melrose Nakayama, MD - Primary  PHYSICIAN ASSISTANT: Mahlet Jergens PA-C  ANESTHESIA:   general  EBL:  300 mL   BLOOD ADMINISTERED:none  DRAINS: (2) Blake drain(s) in the RIGHT CHEST   LOCAL MEDICATIONS USED:  BUPIVICAINE   SPECIMEN:  Source of Specimen:  PLEURAL PEEL AND CULTURES  DISPOSITION OF SPECIMEN:  PATHOLOGY  COUNTS:  YES  TOURNIQUET:  * No tourniquets in log *  DICTATION: .Other Dictation: Dictation Number PENDING  PLAN OF CARE: Admit to inpatient   PATIENT DISPOSITION:  PACU - hemodynamically stable.   Delay start of Pharmacological VTE agent (>24hrs) due to surgical blood loss or risk of bleeding: yes  COMPLICATIONS: NO KNOWN

## 2018-09-02 NOTE — Progress Notes (Signed)
Admitting triad paged stating pt's arrival at 2040. Dr. Blaine Hamper called back and stated to let DR. Bartle know. DR. Cyndia Bent called and notified at 2112, stated DR. Hendericks will come and talk to pt in the am. Dr. Blaine Hamper by bedside currently. Will continue to monitor.

## 2018-09-02 NOTE — Progress Notes (Addendum)
PROGRESS NOTE    Patient: Roy [name]                            PCP: Patient, No Pcp Per                    DOB: Apr 24, 1971            DOA: 09/01/2018 BPZ:[id number]             DOS: 09/02/2018, 1:04 PM   LOS: 1 day   Date of Service: The patient was seen and examined on 09/02/2018  Subjective:   Stable, patient was taken to OR, for thoracotomy, decortication and possible drain placement for pleural effusion on the right. Been n.p.o. Overnight Spiked a temp overnight, met early signs of sepsis, sepsis protocol, patient was initiated on IV fluid resuscitation, broad-spectrum antibiotics.  Morning patient was stable afebrile normotensive  Brief Narrative:   Roy [name] is a 47 y.o. male with medical history significant of hypertension, tobacco abuse, vaping THC, brain aneurysm (coiled 7 years ago), cardiomyopathy, who presents with shortness of breath and chest pain.  Pt is transferred from [facility].  Patient was recently admitted to the hospital from a 9/3-9/6 because of acute hypoxia respiratory failure, which was likely due to multifocal pneumonia. Pt was initially treated with vanc, meropenem, and azithromycin; transitioned to CTX/azithro 9/4,thenchangedto po omnicef and azithromycin total 5 day course at discharge. Per discharge summary, IR was consulted for thoracentesison 9/5, but not enough fluid to drain.  Patient had negative respiratory virus panel, urine antigen for Legionella and strep.   Pt states that he completed the course of antibiotics, but developed increasing shortness of breath and right sided chest pain. Pt was admitted to [facility] again on 08/28/2018. He was found to have large right-sided pleural effusion. Ultrasound-guided thoracentesis removed 1.5 L of fluid, which appears exudative, with elevated protein, but gram stain negative, concerning for possible empyema. Chest tube was placed. Pt had spiking  fever and has been treated with IV vancomycin and cefepime. The repeated CXR from today showed possible loculated effusion. They consulted Dr. Cyndia [facility], planning decortication procedure on 09/02/2018.  Principal Problem:   Empyema lung ([place]) Active Problems:   Pleural effusion on right   Multifocal pneumonia   Essential hypertension   Tobacco abuse   Sepsis ([place])    Assessment & Plan:   Right pleural effusion/empyema/multifocal pneumonia/sepsis  -Patient overnight spiked a temp of 101, tachypnea, mild tachycardia, but normotensive -Sepsis protocol was initiated 2.5 L of normal saline bolus was given followed 125 mL/h, broad-spectrum antibiotics and Zosyn was continued  -We will follow with blood cultures and pleural fluid cultures accordingly  CT- surgeon, Dr. Dema Severin was consulted, prior to arrival  Patient was seen and evaluated by Dr. Shona Needles the procedure to the patient in detail he is planning for thoracotomy, decortication, and possible drain placement for right pleural effusion this morning.  Postop we will reevaluate the patient -We will continue to monitor closely -Follow-up with labs, cultures, PRN analgesics  Essential hypertension:  -continue lisinopril -IV hydralazine as needed  Tobacco abuse and vaping THC: Patient states that he quit smoking 3 weeks ago, stopped weeping.  Patient states that he does not need nicotine patch. -Encouraged patient not to restart smoking or vaping.  Lung nodule: CT of chest on 08/30/18 showed  persistent nodular opacity in the anterior lingula, 1.8 cm currently. More inferiorly in the left upper lobe, the previously measured 2 cm masslike area has improved, now 8 mm. 9 mm nodular density peripherally in the left lower lobe. Pt has family history of lung cancer and throat cancer (patient's father), will need to rule out lung cancer. -need to be followed up by PCP -may give referral to  oncologist   DVT prophylaxis: SCD/Compression stockings  Code Status:   Code Status: Full Code  Family Communication:  The above findings and plan of care has been discussed with patient and family in detail, they expressed understanding and agreement of above.  Disposition Plan:  >3 days  Consultants: CT surgery Dr. Roxan Hockey  Procedures:     Thoracotomy/decortication/placement for right pleural effusion/empyema on 09/02/2018  Antimicrobials:  Anti-infectives (From admission, onward)   Start     Dose/Rate Route Frequency Ordered Stop   09/02/18 0000  [MAR Hold]  vancomycin (VANCOCIN) 1,250 mg in sodium chloride 0.9 % 250 mL IVPB     (MAR Hold since Fri 09/02/2018 at 0951. Reason: Transfer to a Procedural area.)   1,250 mg 166.7 mL/hr over 90 Minutes Intravenous Every 8 hours 09/01/18 2140     09/01/18 2200  [MAR Hold]  ceFEPIme (MAXIPIME) 2 g in sodium chloride 0.9 % 100 mL IVPB     (MAR Hold since Fri 09/02/2018 at 0951. Reason: Transfer to a Procedural area.)   2 g 200 mL/hr over 30 Minutes Intravenous Every 8 hours 09/01/18 2140         Medication:  . [MAR Hold] sodium chloride   Intravenous Once  . bupivacaine liposome  20 mL Infiltration To OR  . [MAR Hold] lisinopril  5 mg Oral Daily    0.9 % irrigation (POUR BTL), [MAR Hold] albuterol, [MAR Hold] ALPRAZolam, bupivacaine, bupivacaine liposome (EXPAREL 1.3%) in normal saline Optime, [MAR Hold] guaiFENesin, [MAR Hold] hydrALAZINE, [MAR Hold] ibuprofen, [MAR Hold]  morphine injection, [MAR Hold] ondansetron **OR** [MAR Hold] ondansetron (ZOFRAN) IV, [MAR Hold] oxyCODONE, [MAR Hold] senna-docusate, [MAR Hold] zolpidem     Objective:   Vitals:   09/02/18 0016 09/02/18 0328 09/02/18 0508 09/02/18 0822  BP: (!) 133/51 (!) 148/57  136/88  Pulse: 80 71    Resp: 20 (!) 23 (!) 24 20  Temp: 100 F (37.8 C) (!) 101 F (38.3 C) 99.3 F (37.4 C) 98.4 F (36.9 C)  TempSrc: Oral Oral Oral Oral  SpO2: 96% 98% 100% 96%   Weight:      Height:        Intake/Output Summary (Last 24 hours) at 09/02/2018 1304 Last data filed at 09/02/2018 1232 Gross per 24 hour  Intake 2533.65 ml  Output 1656 ml  Net 877.65 ml   Filed Weights   09/01/18 1940  Weight: 108.4 kg     Examination:    General exam: Appears calm and comfortable  BP 136/88 (BP Location: Right Arm)   Pulse 71   Temp 98.4 F (36.9 C) (Oral)   Resp 20   Ht '6\' 3"'$  (1.905 m)   Wt 108.4 kg   SpO2 96%   BMI 29.86 kg/m    Physical Exam  Constitution:  Alert, cooperative, no distress,  Psychiatric: Normal and stable mood and affect, cognition intact,   HEENT: Normocephalic, PERRL, otherwise with in Normal limits  Chest:Chest symmetric Cardio vascular:  S1/S2, RRR, No murmure, No Rubs or Gallops  pulmonary: Clear to auscultation bilaterally, respirations unlabored, negative wheezes /  crackles Abdomen: Soft, non-tender, non-distended, bowel sounds,no masses, no organomegaly Muscular skeletal: Limited exam - in bed, able to move all 4 extremities, Normal strength,  Neuro: CNII-XII intact. , normal motor and sensation, reflexes intact  Extremities: No pitting edema lower extremities, +2 pulses  Skin: Dry, warm to touch, negative for any Rashes, No open wounds Wounds: per nursing documentation  LABs:  CBC Latest Ref Rng & Units 09/02/2018 08/30/2018 08/28/2018  WBC 4.0 - 10.5 K/uL 7.9 9.6 11.7(H)  Hemoglobin 13.0 - 17.0 g/dL 10.7(L) 11.9(L) 12.5(L)  Hematocrit 39.0 - 52.0 % 32.6(L) 34.8(L) 35.4(L)  Platelets 150 - 400 K/uL 418(H) 463(H) 482(H)   CMP Latest Ref Rng & Units 09/02/2018 08/28/2018 08/19/2018  Glucose 70 - 99 mg/dL 111(H) 124(H) 115(H)  BUN 6 - 20 mg/dL '9 12 15  '$ Creatinine 0.61 - 1.24 mg/dL 0.74 0.81 0.73  Sodium 135 - 145 mmol/L 138 138 142  Potassium 3.5 - 5.1 mmol/L 4.4 3.7 3.9  Chloride 98 - 111 mmol/L 102 100 105  CO2 22 - 32 mmol/L '26 31 28  '$ Calcium 8.9 - 10.3 mg/dL 8.0(L) 8.4(L) 8.6(L)  Total Protein 6.5 - 8.1 g/dL  6.0(L) 7.3 -  Total Bilirubin 0.3 - 1.2 mg/dL 0.4 0.8 -  Alkaline Phos 38 - 126 U/L 119 125 -  AST 15 - 41 U/L 73(H) 80(H) -  ALT 0 - 44 U/L 147(H) 226(H) -

## 2018-09-02 NOTE — Transfer of Care (Signed)
Immediate Anesthesia Transfer of Care Note  Patient: Roy [name]  Procedure(s) Performed: VIDEO ASSISTED THORACOSCOPY (VATS)/possible THOROCOTOMY (Right Chest) DECORTICATION (Right ) DRAINAGE OF PLEURAL EFFUSION (Right )  Patient Location: PACU  Anesthesia Type:General  Level of Consciousness: awake, alert  and oriented  Airway & Oxygen Therapy: Patient Spontanous Breathing and Patient connected to face mask oxygen  Post-op Assessment: Report given to RN, Post -op Vital signs reviewed and stable and Patient moving all extremities X 4  Post vital signs: Reviewed and stable  Last Vitals:  Vitals Value Taken Time  BP 129/104 09/02/2018  1:40 PM  Temp    Pulse 76 09/02/2018  1:41 PM  Resp 19 09/02/2018  1:41 PM  SpO2 99 % 09/02/2018  1:41 PM  Vitals shown include unvalidated device data.  Last Pain:  Vitals:   09/02/18 0841  TempSrc:   PainSc: 8       Patients Stated Pain Goal: 1 (50/38/88 2800)  Complications: No apparent anesthesia complications

## 2018-09-02 NOTE — Progress Notes (Signed)
Received a call from [place] ( OR), stated she will page DR. Hendrick and will notify pt needs explanation about procedure. Will pas it on to oncoming RN.

## 2018-09-02 NOTE — Progress Notes (Signed)
OR called asking if pt was ready for surgery since he is first case at 23, pt stated nobody has given him enough information about the surgery and he is not sure if he wants to do the surgery, refused to sign consent. OR staff aware, staff stated he will let DR. Hendrick know. Surgical PCR done, Pt NPO since midnight. Will continue to monitor.

## 2018-09-02 NOTE — Progress Notes (Signed)
Pt received from [facility] via care link. Pt ao x 4 on arrival, SOB on 2l o2 via [place]. 12 fr chest tube to right lateral chest intact, dressing CDI. CT is connected to -25 wall suction, no air leak noted. Serosanguinous drainage with some solid/tissue exudate visible in chest tube atrium. CHG bath completed. Pt connected to monitor, CCMD notified. Oriented to room. Call light within reach. Pt's girlfrieng by bedside, will continue to monitor.

## 2018-09-02 NOTE — Anesthesia Procedure Notes (Addendum)
Procedure Name: Intubation Date/Time: 09/02/2018 10:57 AM Performed by: Lidia Collum, MD Pre-anesthesia Checklist: Timeout performed, Patient being monitored, Suction available, Emergency Drugs available and Patient identified Patient Re-evaluated:Patient Re-evaluated prior to induction Oxygen Delivery Method: Circle system utilized Preoxygenation: Pre-oxygenation with 100% oxygen Induction Type: IV induction Ventilation: Mask ventilation without difficulty Laryngoscope Size: Mac, 4, Miller and 3 (Intubation by Dr. Christella Hartigan) Grade View: Grade II Tube type: MLT Endobronchial tube: Left, Double lumen EBT and EBT position confirmed by fiberoptic bronchoscope and 41 Fr Number of attempts: 3 Placement Confirmation: breath sounds checked- equal and bilateral,  positive ETCO2 and ETT inserted through vocal cords under direct vision Secured at: 25 cm Tube secured with: Tape Dental Injury: Teeth and Oropharynx as per pre-operative assessment  Comments: 1 attempt with MAC 4 by CRNA, 1 attempt MIL 3 by MD

## 2018-09-03 ENCOUNTER — Inpatient Hospital Stay (HOSPITAL_COMMUNITY): Payer: Self-pay

## 2018-09-03 LAB — BASIC METABOLIC PANEL
Anion gap: 9 (ref 5–15)
BUN: 8 mg/dL (ref 6–20)
CALCIUM: 7 mg/dL — AB (ref 8.9–10.3)
CO2: 22 mmol/L (ref 22–32)
Chloride: 109 mmol/L (ref 98–111)
Creatinine, Ser: 0.65 mg/dL (ref 0.61–1.24)
GFR calc Af Amer: >60 mL/min (ref >60)
Glucose, Bld: 144 mg/dL — ABNORMAL HIGH (ref 70–99)
POTASSIUM: 4 mmol/L (ref 3.5–5.1)
Sodium: 140 mmol/L (ref 135–145)

## 2018-09-03 LAB — POCT I-STAT 3, ART BLOOD GAS (G3+)
Acid-base deficit: 8 mmol/L — ABNORMAL HIGH (ref 0.0–2.0)
BICARBONATE: 17.2 mmol/L — AB (ref 20.0–28.0)
O2 Saturation: 100 %
TCO2: 18 mmol/L — ABNORMAL LOW (ref 22–32)
pCO2 arterial: 32.1 mmHg (ref 32.0–48.0)
pH, Arterial: 7.335 — ABNORMAL LOW (ref 7.350–7.450)
pO2, Arterial: 180 mmHg — ABNORMAL HIGH (ref 83.0–108.0)

## 2018-09-03 LAB — CBC
HCT: 22.8 % — ABNORMAL LOW (ref 39.0–52.0)
Hemoglobin: 7.1 g/dL — ABNORMAL LOW (ref 13.0–17.0)
MCH: 32.6 pg (ref 26.0–34.0)
MCHC: 31.1 g/dL (ref 30.0–36.0)
MCV: 104.6 fL — AB (ref 78.0–100.0)
PLATELETS: 308 10*3/uL (ref 150–400)
RBC: 2.18 MIL/uL — ABNORMAL LOW (ref 4.22–5.81)
RDW: 12.7 % (ref 11.5–15.5)
WBC: 10.7 10*3/uL — ABNORMAL HIGH (ref 4.0–10.5)

## 2018-09-03 LAB — ACID FAST SMEAR (AFB, MYCOBACTERIA)
Acid Fast Smear: NEGATIVE
Acid Fast Smear: NEGATIVE

## 2018-09-03 LAB — HEMOGLOBIN AND HEMATOCRIT, BLOOD
HEMATOCRIT: 30.9 % — AB (ref 39.0–52.0)
Hemoglobin: 10 g/dL — ABNORMAL LOW (ref 13.0–17.0)

## 2018-09-03 LAB — PREPARE RBC (CROSSMATCH)

## 2018-09-03 MED ORDER — FUROSEMIDE 10 MG/ML IJ SOLN: 20.0000 mg | Freq: Once | INTRAMUSCULAR | Status: DC

## 2018-09-03 MED ORDER — ALBUTEROL SULFATE (2.5 MG/3ML) 0.083% IN NEBU
2.5000 mg | INHALATION_SOLUTION | Freq: Three times a day (TID) | RESPIRATORY_TRACT | Status: DC
  Administered 2018-09-03 – 2018-09-07 (×12): 2.5 mg via RESPIRATORY_TRACT
  Filled 2018-09-03 (×12): qty 3

## 2018-09-03 MED ORDER — TRAMADOL HCL 50 MG PO TABS
50.0000 mg | ORAL_TABLET | Freq: Four times a day (QID) | ORAL | Status: DC | PRN
  Administered 2018-09-04 – 2018-09-06 (×4): 50 mg via ORAL
  Filled 2018-09-03 (×4): qty 1

## 2018-09-03 MED ORDER — SODIUM CHLORIDE 0.9% IV SOLUTION: Freq: Once | INTRAVENOUS | Status: DC

## 2018-09-03 MED ORDER — ENOXAPARIN SODIUM 30 MG/0.3ML ~~LOC~~ SOLN
30.0000 mg | SUBCUTANEOUS | Status: DC
  Administered 2018-09-03 – 2018-09-07 (×5): 30 mg via SUBCUTANEOUS
  Filled 2018-09-03 (×5): qty 0.3

## 2018-09-03 MED ORDER — ATROPINE SULFATE 1 MG/10ML IJ SOSY
PREFILLED_SYRINGE | INTRAMUSCULAR | Status: AC
  Filled 2018-09-03: qty 10

## 2018-09-03 MED ORDER — DIPHENHYDRAMINE HCL 25 MG PO CAPS: 25.0000 mg | ORAL_CAPSULE | Freq: Once | ORAL | Status: DC

## 2018-09-03 MED ORDER — ACETAMINOPHEN 325 MG PO TABS: 650.0000 mg | ORAL_TABLET | Freq: Once | ORAL | Status: DC

## 2018-09-03 MED ORDER — OXYCODONE HCL 5 MG PO TABS
5.0000 mg | ORAL_TABLET | ORAL | Status: DC | PRN
  Administered 2018-09-03: 5 mg via ORAL
  Filled 2018-09-03 (×2): qty 1

## 2018-09-03 MED ORDER — OXYCODONE HCL 5 MG PO TABS
5.0000 mg | ORAL_TABLET | ORAL | Status: DC | PRN
  Administered 2018-09-03 – 2018-09-04 (×3): 10 mg via ORAL
  Administered 2018-09-04: 5 mg via ORAL
  Administered 2018-09-04 – 2018-09-07 (×17): 10 mg via ORAL
  Filled 2018-09-03 (×21): qty 2

## 2018-09-03 NOTE — Plan of Care (Signed)
  Problem: Clinical Measurements: Goal: Respiratory complications will improve Outcome: Progressing   Problem: Nutrition: Goal: Adequate nutrition will be maintained Outcome: Progressing   Problem: Coping: Goal: Level of anxiety will decrease Outcome: Progressing   Problem: Elimination: Goal: Will not experience complications related to urinary retention Outcome: Progressing   Problem: Pain Managment: Goal: General experience of comfort will improve Outcome: Progressing   Problem: Safety: Goal: Ability to remain free from injury will improve Outcome: Progressing   Problem: Skin Integrity: Goal: Risk for impaired skin integrity will decrease Outcome: Progressing   

## 2018-09-03 NOTE — Progress Notes (Signed)
PROGRESS NOTE    Patient: Roy [name]                            PCP: Patient, No Pcp Per                    DOB: Feb 05, 1971            DOA: 09/01/2018 [id number]             DOS: 09/03/2018, 1:03 PM   LOS: 2 days   Date of Service: The patient was seen and examined on 09/03/2018  Subjective:   Stable, patient was seen and examined this morning in no acute distress on O2 via nasal cannula Postop day #1  S/p Rt  thoracotomy/decortication, large to head chest tube placement  The patient was noted for drop in hemoglobin from 8.2-7.1  Brief Narrative:   Roy [name] is a 47 y.o. male with medical history significant of hypertension, tobacco abuse, vaping THC, brain aneurysm (coiled 7 years ago), cardiomyopathy, who presents with shortness of breath and chest pain.  Pt is transferred from [facility].  Patient was recently admitted to the hospital from a 9/3-9/6 because of acute hypoxia respiratory failure, which was likely due to multifocal pneumonia. Pt was initially treated with vanc, meropenem, and azithromycin; transitioned to CTX/azithro 9/4,thenchangedto po omnicef and azithromycin total 5 day course at discharge. Per discharge summary, IR was consulted for thoracentesison 9/5, but not enough fluid to drain.  Patient had negative respiratory virus panel, urine antigen for Legionella and strep.   Pt states that he completed the course of antibiotics, but developed increasing shortness of breath and right sided chest pain. Pt was admitted to [facility] on 08/28/2018. He was found to have large right-sided pleural effusion. Ultrasound-guided thoracentesis removed 1.5 L of fluid, which appears exudative, with elevated protein, but gram stain negative, concerning for possible empyema. Chest tube was placed. Pt had spiking fever and has been treated with IV vancomycin and cefepime. The repeated CXR from today showed possible  loculated effusion. They consulted Dr. Cyndia Bent [facility] [place], planning decortication procedure on 09/02/2018.  Principal Problem:   Empyema lung ([place]) Active Problems:   Pleural effusion on right   Multifocal pneumonia   Essential hypertension   Tobacco abuse   Sepsis ([place])    Assessment & Plan:   Right pleural effusion/empyema/multifocal pneumonia/sepsis  -Postop day #1 -Status post right (VASTS) thoracotomy/decortication, drain placement for pleural effusion By Ct surgery Dr. Roxan Hockey  Sepsis /due to above -Patient noted to be stable, afebrile, normotensive, -Patient overnight spiked a temp of 101, tachypnea, mild tachycardia, but normotensive -Sepsis protocol was initiated 2.5 L of normal saline bolus was given followed 125 mL/h, broad-spectrum antibiotics and Zosyn was continued -We will follow with blood cultures and pleural fluid cultures accordingly  Postop anemia -Short drop in hemoglobin was noted, and 0.9-7.1, hemoglobin was repeated currently 10.0 we will continue to monitor -We will transfuse if hemoglobin below 7 or symptomatic   Essential hypertension:  -continue lisinopril -IV hydralazine as needed  Tobacco abuse and vaping THC: Patient states that he quit smoking 3 weeks ago, stopped weeping.  Patient states that he does not need nicotine patch. -Encouraged patient not to restart smoking or vaping.  Lung nodule: CT of chest on 08/30/18 showed persistent nodular opacity in the anterior lingula, 1.8  cm currently. More inferiorly in the left upper lobe, the previously measured 2 cm masslike area has improved, now 8 mm. 9 mm nodular density peripherally in the left lower lobe. Pt has family history of lung cancer and throat cancer (patient's father), will need to rule out lung cancer. -need to be followed up by PCP -may give referral to oncologist   DVT prophylaxis: SCD/Compression stockings  Code Status:   Code Status: Full Code  Family  Communication:  The above findings and plan of care has been discussed with patient and family in detail, they expressed understanding and agreement of above.  Disposition Plan:  >3 days  Consultants: CT surgery Dr. Roxan Hockey  Procedures:     Thoracotomy/decortication/placement for right pleural effusion/empyema on 09/02/2018  Antimicrobials:  Anti-infectives (From admission, onward)   Start     Dose/Rate Route Frequency Ordered Stop   09/02/18 0000  vancomycin (VANCOCIN) 1,250 mg in sodium chloride 0.9 % 250 mL IVPB     1,250 mg 166.7 mL/hr over 90 Minutes Intravenous Every 8 hours 09/01/18 2140     09/01/18 2200  ceFEPIme (MAXIPIME) 2 g in sodium chloride 0.9 % 100 mL IVPB     2 g 200 mL/hr over 30 Minutes Intravenous Every 8 hours 09/01/18 2140         Medication:  . acetaminophen  1,000 mg Oral Q6H   Or  . acetaminophen (TYLENOL) oral liquid 160 mg/5 mL  1,000 mg Oral Q6H  . albuterol  2.5 mg Nebulization TID  . bisacodyl  10 mg Oral Daily  . enoxaparin (LOVENOX) injection  30 mg Subcutaneous Q24H  . fentaNYL   Intravenous Q4H  . ketorolac  30 mg Intravenous Q8H  . lisinopril  5 mg Oral Daily  . pantoprazole  40 mg Oral Daily  . senna-docusate  1 tablet Oral QHS    diphenhydrAMINE **OR** diphenhydrAMINE, guaiFENesin, hydrALAZINE, naloxone **AND** sodium chloride flush, ondansetron (ZOFRAN) IV, oxyCODONE, potassium chloride, traMADol, zolpidem     Objective:   Vitals:   09/03/18 0900 09/03/18 1000 09/03/18 1219 09/03/18 1223  BP: 137/70 (!) 143/79    Pulse: 74 67    Resp: (!) 30 17 20    Temp:    (!) 97.5 F (36.4 C)  TempSrc:    Oral  SpO2: 100% 99% 95%   Weight:      Height:        Intake/Output Summary (Last 24 hours) at 09/03/2018 1303 Last data filed at 09/03/2018 0800 Gross per 24 hour  Intake 4258.89 ml  Output 2255 ml  Net 2003.89 ml   Filed Weights   09/01/18 1940  Weight: 108.4 kg     Examination:    General exam: Appears calm and  comfortable  BP (!) 143/79   Pulse 67   Temp (!) 97.5 F (36.4 C) (Oral)   Resp 20   Ht 6\' 3"  (1.905 m)   Wt 108.4 kg   SpO2 95%   BMI 29.86 kg/m    Physical Exam  Constitution:  Alert, cooperative, no distress,  Psychiatric: Normal and stable mood and affect, cognition intact,   HEENT: Normocephalic, PERRL, otherwise with in Normal limits  Chest:Chest symmetric Cardio vascular:  S1/S2, RRR, No murmure, No Rubs or Gallops  pulmonary: Reduced air exchange on right upper and lower lobes, respirations unlabored, negative wheezes / crackles Abdomen: Soft, non-tender, non-distended, bowel sounds,no masses, no organomegaly Muscular skeletal: Limited exam - in bed, able to move all 4 extremities, Normal strength,  Neuro: CNII-XII intact. , normal motor and sensation, reflexes intact  Extremities: No pitting edema lower extremities, +2 pulses  Skin: Dry, warm to touch, negative for any Rashes, chest tube on the right flank area Wounds: Chest tube on the right side  LABs:  CBC Latest Ref Rng & Units 09/03/2018 09/03/2018 09/02/2018  WBC 4.0 - 10.5 K/uL - 10.7(H) -  Hemoglobin 13.0 - 17.0 g/dL 10.0(L) 7.1(L) 8.2(L)  Hematocrit 39.0 - 52.0 % 30.9(L) 22.8(L) 24.0(L)  Platelets 150 - 400 K/uL - 308 -   CMP Latest Ref Rng & Units 09/03/2018 09/02/2018 09/02/2018  Glucose 70 - 99 mg/dL 144(H) - 111(H)  BUN 6 - 20 mg/dL 8 - 9  Creatinine 0.61 - 1.24 mg/dL 0.65 - 0.74  Sodium 135 - 145 mmol/L 140 144 138  Potassium 3.5 - 5.1 mmol/L 4.0 4.1 4.4  Chloride 98 - 111 mmol/L 109 - 102  CO2 22 - 32 mmol/L 22 - 26  Calcium 8.9 - 10.3 mg/dL 7.0(L) - 8.0(L)  Total Protein 6.5 - 8.1 g/dL - - 6.0(L)  Total Bilirubin 0.3 - 1.2 mg/dL - - 0.4  Alkaline Phos 38 - 126 U/L - - 119  AST 15 - 41 U/L - - 73(H)  ALT 0 - 44 U/L - - 147(H)

## 2018-09-03 NOTE — Progress Notes (Signed)
Pharmacy Antibiotic Note  Roy [name] is a 47 y.o. male admitted on 08/28/2018 with recurrent right pleural effusion, s/p treatment for PNA, now spiking fevers.  Pharmacy has been consulted for vancomycin and cefepime dosing - day #7. Dose increased for VT of 12 on 9/18. SCr stable at 0.65.  Plan: Vancomycin 1250 mg iv Q 8 Cefepime 2 grams iv Q 8 hours  F/u micro date, pt's clinical condition, and renal function Vancomycin trough 9/22 if continuing  Height: 6\' 3"  (190.5 cm) Weight: 238 lb 14.4 oz (108.4 kg) IBW/kg (Calculated) : 84.5  Temp (24hrs), Avg:98.1 F (36.7 C), Min:97.5 F (36.4 C), Max:98.8 F (37.1 C)  Recent Labs  Lab 08/28/18 0653 08/28/18 0802 08/28/18 0803 08/30/18 0735 08/31/18 2006 09/02/18 0403 09/02/18 0742 09/03/18 0334  WBC 11.7*  --   --  9.6  --  7.9  --  10.7*  CREATININE  --   --  0.81  --   --  0.74  --  0.65  LATICACIDVEN  --  0.8  --   --   --  0.6 0.6  --   VANCOTROUGH  --   --   --   --  13*  --   --   --     Estimated Creatinine Clearance: 150.3 mL/min (by C-G formula based on SCr of 0.65 mg/dL).    No Known Allergies  Elicia Lamp, PharmD, BCPS Clinical Pharmacist Clinical phone [phone] Please check AMION for all [place] contact numbers 09/03/2018 3:35 PM

## 2018-09-03 NOTE — Progress Notes (Signed)
Patient refused mobility this morning. Patient was educated and reinforced about the importance of mobility.

## 2018-09-03 NOTE — Progress Notes (Addendum)
Patient ID: Roy [name], male   DOB: 09/22/1971, 47 y.o.   MRN: [id number] TCTS DAILY ICU PROGRESS NOTE                   [place]            [place] [address]          [phone]   1 Day Post-Op Procedure(s) (LRB): VIDEO ASSISTED THORACOSCOPY (VATS)/possible THOROCOTOMY (Right) DECORTICATION (Right) DRAINAGE OF PLEURAL EFFUSION (Right)  Total Length of Stay:  LOS: 2 days   Subjective: Patient awake alert feels well today, notes his respiratory status is much improved compared to admission  Objective: Vital signs in last 24 hours: Temp:  [97.8 F (36.6 C)-98.8 F (37.1 C)] 97.9 F (36.6 C) (09/21 0400) Pulse Rate:  [61-79] 61 (09/21 0700) Cardiac Rhythm: Normal sinus rhythm (09/21 0758) Resp:  [15-27] 15 (09/21 0700) BP: (124-173)/(62-104) 124/71 (09/21 0700) SpO2:  [90 %-100 %] 100 % (09/21 0739) Arterial Line BP: (142-171)/(56-69) 151/62 (09/20 1500)  Filed Weights   09/01/18 1940  Weight: 108.4 kg    Weight change:    Hemodynamic parameters for last 24 hours:    Intake/Output from previous day: 09/20 0701 - 09/21 0700 In: 5218.7 [P.O.:360; I.V.:3817.6; IV Piggyback:1041.1] Out: 3105 [Urine:2325; Blood:300; Chest Tube:480]  Intake/Output this shift: Total I/O In: 240.2 [I.V.:240.2] Out: 0   Current Meds: Scheduled Meds: . acetaminophen  1,000 mg Oral Q6H   Or  . acetaminophen (TYLENOL) oral liquid 160 mg/5 mL  1,000 mg Oral Q6H  . albuterol  2.5 mg Nebulization TID  . bisacodyl  10 mg Oral Daily  . fentaNYL   Intravenous Q4H  . ketorolac  30 mg Intravenous Q8H  . lisinopril  5 mg Oral Daily  . pantoprazole  40 mg Oral Daily  . senna-docusate  1 tablet Oral QHS   Continuous Infusions: . sodium chloride 125 mL/hr at 09/03/18 0800  . ceFEPime (MAXIPIME) IV Stopped (09/03/18 0453)  . potassium chloride    . vancomycin Stopped (09/03/18 0125)   PRN Meds:.diphenhydrAMINE **OR** diphenhydrAMINE, guaiFENesin, hydrALAZINE,  naloxone **AND** sodium chloride flush, ondansetron (ZOFRAN) IV, potassium chloride, zolpidem  General appearance: alert, cooperative and no distress Neurologic: intact Heart: regular rate and rhythm, S1, S2 normal, no murmur, click, rub or gallop Lungs: diminished breath sounds RLL Abdomen: soft, non-tender; bowel sounds normal; no masses,  no organomegaly Extremities: extremities normal, atraumatic, no cyanosis or edema and Homans sign is negative, no sign of DVT Wound: No air leak from chest tubes  Lab Results: CBC: Recent Labs    09/02/18 0403 09/02/18 1253 09/03/18 0334  WBC 7.9  --  10.7*  HGB 10.7* 8.2* 7.1*  HCT 32.6* 24.0* 22.8*  PLT 418*  --  308   BMET:  Recent Labs    09/02/18 0403 09/02/18 1253 09/03/18 0334  NA 138 144 140  K 4.4 4.1 4.0  CL 102  --  109  CO2 26  --  22  GLUCOSE 111*  --  144*  BUN 9  --  8  CREATININE 0.74  --  0.65  CALCIUM 8.0*  --  7.0*    CMET: Lab Results  Component Value Date   WBC 10.7 (H) 09/03/2018   HGB 7.1 (L) 09/03/2018   HCT 22.8 (L) 09/03/2018   PLT 308 09/03/2018   GLUCOSE 144 (H) 09/03/2018   ALT 147 (H) 09/02/2018   AST 73 (H) 09/02/2018   NA  140 09/03/2018   K 4.0 09/03/2018   CL 109 09/03/2018   CREATININE 0.65 09/03/2018   BUN 8 09/03/2018   CO2 22 09/03/2018   INR 1.19 09/01/2018   HGBA1C 6.3 (H) 08/18/2018      PT/INR:  Recent Labs    09/01/18 2152  LABPROT 15.0  INR 1.19   Radiology: Dg Chest Port 1 View  Result Date: 09/02/2018 CLINICAL DATA:  Status post VATS.  History of empyema EXAM: PORTABLE CHEST 1 VIEW COMPARISON:  09/01/2018 FINDINGS: Interval placement of 2 right-sided chest tubes with significant decrease in volume of large loculated right pleural effusion. No complications identified. No left pleural effusion. IMPRESSION: 1. Decrease in volume of loculated right pleural effusion. 2. Two right-sided chest tubes are in place without evidence for pneumothorax. Electronically Signed   By:  Kerby Moors M.D.   On: 09/02/2018 14:05   Recent Results (from the past 240 hour(s))  Body fluid culture     Status: None   Collection Time: 08/28/18  7:34 PM  Result Value Ref Range Status   Specimen Description   Final    FLUID PLEURAL RIGHT Performed at [facility], [address], [place], [place] [address]    Special Requests   Final    NONE Performed at [facility], [address] [place], [place] [address]    Gram Stain   Final    RARE WBC PRESENT, PREDOMINANTLY MONONUCLEAR NO ORGANISMS SEEN    Culture   Final    NO GROWTH 3 DAYS Performed at [facility], [place] [address], [place], [place] [address]    Report Status 09/01/2018 FINAL  Final  Culture, blood (Routine X 2) w Reflex to ID Panel     Status: None (Preliminary result)   Collection Time: 09/01/18  9:50 PM  Result Value Ref Range Status   Specimen Description BLOOD LEFT ANTECUBITAL  Final   Special Requests   Final    BOTTLES DRAWN AEROBIC AND ANAEROBIC Blood Culture adequate volume   Culture   Final    NO GROWTH < 12 HOURS Performed at [facility], [place] [address], [place], [place] [address]    Report Status PENDING  Incomplete  Culture, blood (Routine X 2) w Reflex to ID Panel     Status: None (Preliminary result)   Collection Time: 09/01/18  9:52 PM  Result Value Ref Range Status   Specimen Description BLOOD RIGHT HAND  Final   Special Requests   Final    BOTTLES DRAWN AEROBIC ONLY Blood Culture adequate volume   Culture   Final    NO GROWTH < 12 HOURS Performed at [facility], [place] [address], [place], [place] [address]    Report Status PENDING  Incomplete  Surgical PCR screen     Status: None   Collection Time: 09/02/18  5:30 AM  Result Value Ref Range Status   MRSA, PCR NEGATIVE NEGATIVE Final   Staphylococcus aureus NEGATIVE NEGATIVE Final    Comment: (NOTE) The Xpert SA Assay (FDA approved for NASAL specimens in patients 77 years of age and older), is  one component of a comprehensive surveillance program. It is not intended to diagnose infection nor to guide or monitor treatment. Performed at [facility], [place] [address], [place], [place] [address]   Body fluid culture     Status: None (Preliminary result)   Collection Time: 09/02/18 11:31 AM  Result Value Ref Range Status  Specimen Description FLUID PLEURAL RIGHT  Final   Special Requests NONE  Final   Gram Stain   Final    CYTOSPIN SMEAR WBC PRESENT, PREDOMINANTLY PMN NO ORGANISMS SEEN Performed at [facility], [place] [address] [place], [place] [address]    Culture PENDING  Incomplete   Report Status PENDING  Incomplete  Aerobic/Anaerobic Culture (surgical/deep wound)     Status: None (Preliminary result)   Collection Time: 09/02/18 11:31 AM  Result Value Ref Range Status   Specimen Description TISSUE PLEURAL RIGHT  Final   Special Requests SPECIMEN C  Final   Gram Stain   Final    MODERATE WBC PRESENT, PREDOMINANTLY PMN NO ORGANISMS SEEN RESULT CALLED TO, READ BACK BY AND VERIFIED WITH: ROSE RN AT 1300 ON [id number] BY SJW Performed at [facility], [place] [address] [place], [place] [address]    Culture PENDING  Incomplete   Report Status PENDING  Incomplete  Aerobic/Anaerobic Culture (surgical/deep wound)     Status: None (Preliminary result)   Collection Time: 09/02/18 11:32 AM  Result Value Ref Range Status   Specimen Description TISSUE PLEURAL RIGHT  Final   Special Requests SPECIMEN D  Final   Gram Stain   Final    MODERATE WBC PRESENT, PREDOMINANTLY PMN NO ORGANISMS SEEN RESULT CALLED TO, READ BACK BY AND VERIFIED WITH: ROSE RN AT 1300 ON [id number] BY SJW Performed at [facility], [place] [address], [place], [place] [address]    Culture PENDING  Incomplete   Report Status PENDING  Incomplete    Assessment/Plan: S/P Procedure(s) (LRB): VIDEO ASSISTED THORACOSCOPY (VATS)/possible THOROCOTOMY (Right) DECORTICATION (Right) DRAINAGE OF PLEURAL  EFFUSION (Right) Mobilize Continue ABX therapy due to preop empyema DC arterial line Expected blood loss anemia, discussed blood transfusion with patient will try to avoid Chest tubes to waterseal, leave and currently  Grace Isaac 09/03/2018 8:07 AM

## 2018-09-04 ENCOUNTER — Inpatient Hospital Stay (HOSPITAL_COMMUNITY): Payer: Self-pay

## 2018-09-04 LAB — COMPREHENSIVE METABOLIC PANEL
ALBUMIN: 1.6 g/dL — AB (ref 3.5–5.0)
ALK PHOS: 93 U/L (ref 38–126)
ALT: 99 U/L — AB (ref 0–44)
AST: 42 U/L — AB (ref 15–41)
Anion gap: 9 (ref 5–15)
BUN: 11 mg/dL (ref 6–20)
CALCIUM: 7.7 mg/dL — AB (ref 8.9–10.3)
CO2: 27 mmol/L (ref 22–32)
CREATININE: 0.83 mg/dL (ref 0.61–1.24)
Chloride: 105 mmol/L (ref 98–111)
GFR calc Af Amer: >60 mL/min (ref >60)
GFR calc non Af Amer: >60 mL/min (ref >60)
Glucose, Bld: 112 mg/dL — ABNORMAL HIGH (ref 70–99)
Potassium: 3.6 mmol/L (ref 3.5–5.1)
SODIUM: 141 mmol/L (ref 135–145)
Total Bilirubin: 0.3 mg/dL (ref 0.3–1.2)
Total Protein: 5.4 g/dL — ABNORMAL LOW (ref 6.5–8.1)

## 2018-09-04 LAB — CBC
HCT: 27.7 % — ABNORMAL LOW (ref 39.0–52.0)
HEMOGLOBIN: 8.8 g/dL — AB (ref 13.0–17.0)
MCH: 32 pg (ref 26.0–34.0)
MCHC: 31.8 g/dL (ref 30.0–36.0)
MCV: 100.7 fL — ABNORMAL HIGH (ref 78.0–100.0)
PLATELETS: 433 10*3/uL — AB (ref 150–400)
RBC: 2.75 MIL/uL — ABNORMAL LOW (ref 4.22–5.81)
RDW: 12.9 % (ref 11.5–15.5)
WBC: 8.7 10*3/uL (ref 4.0–10.5)

## 2018-09-04 LAB — VANCOMYCIN, TROUGH: VANCOMYCIN TR: 15 ug/mL (ref 15–20)

## 2018-09-04 MED ORDER — FENTANYL CITRATE (PF) 100 MCG/2ML IJ SOLN
25.0000 ug | Freq: Once | INTRAMUSCULAR | Status: AC
  Administered 2018-09-04: 25 ug via INTRAVENOUS
  Filled 2018-09-04: qty 2

## 2018-09-04 NOTE — Progress Notes (Signed)
dc'ed chest tube pt. tolerated well  

## 2018-09-04 NOTE — Progress Notes (Signed)
Pharmacy Antibiotic Note  Roy [name] is a 47 y.o. male admitted on 08/28/2018 with recurrent right pleural effusion, s/p treatment for PNA, now spiking fevers.  Pharmacy has been consulted for vancomycin and cefepime dosing - day #8.  Vancomycin trough today is therapeutic, drawn appropriately. SCr stable.  Plan: Continue vancomycin 1250mg  IV Q8h Cefepime 2g IV Q8h Monitor clinical progress, c/s, renal function F/u de-escalation plan/LOT, vancomycin trough as indicated   Height: 6\' 3"  (190.5 cm) Weight: 238 lb 14.4 oz (108.4 kg) IBW/kg (Calculated) : 84.5  Temp (24hrs), Avg:98.4 F (36.9 C), Min:97.5 F (36.4 C), Max:98.9 F (37.2 C)  Recent Labs  Lab 08/30/18 0735 08/31/18 2006 09/02/18 0403 09/02/18 0742 09/03/18 0334 09/04/18 0332 09/04/18 1122  WBC 9.6  --  7.9  --  10.7* 8.7  --   CREATININE  --   --  0.74  --  0.65 0.83  --   LATICACIDVEN  --   --  0.6 0.6  --   --   --   VANCOTROUGH  --  13*  --   --   --   --  15    Estimated Creatinine Clearance: 144.9 mL/min (by C-G formula based on SCr of 0.83 mg/dL).    No Known Allergies  Elicia Lamp, PharmD, BCPS Clinical Pharmacist Clinical phone [phone] Please check AMION for all [place] contact numbers 09/04/2018 12:11 PM

## 2018-09-04 NOTE — Progress Notes (Signed)
PROGRESS NOTE    Patient: Roy [name]                            PCP: Patient, No Pcp Per                    DOB: 01/06/1971            DOA: 09/01/2018 [id number]             DOS: 09/04/2018, 10:10 AM   LOS: 3 days   Date of Service: The patient was seen and examined on 09/04/2018  Subjective:   Postop day # 2 Status post VATS, right lung thoracotomy/decortication Chest tube in place, no air leak, waterseal,  The patient was seen and examined this morning stable no acute distress, and breakfast. Off O2 via nasal cannula.  Brief Narrative:   Roy [name] is a 47 y.o. male with medical history significant of hypertension, tobacco abuse, vaping THC, brain aneurysm (coiled 7 years ago), cardiomyopathy, who presents with shortness of breath and chest pain.  Pt is transferred from [facility].  Patient was recently admitted to the hospital from a 9/3-9/6 because of acute hypoxia respiratory failure, which was likely due to multifocal pneumonia. Pt was initially treated with vanc, meropenem, and azithromycin; transitioned to CTX/azithro 9/4,thenchangedto po omnicef and azithromycin total 5 day course at discharge. Per discharge summary, IR was consulted for thoracentesison 9/5, but not enough fluid to drain.  Patient had negative respiratory virus panel, urine antigen for Legionella and strep.   Pt states that he completed the course of antibiotics, but developed increasing shortness of breath and right sided chest pain. Pt was admitted to [facility] on 08/28/2018. He was found to have large right-sided pleural effusion. Ultrasound-guided thoracentesis removed 1.5 L of fluid, which appears exudative, with elevated protein, but gram stain negative, concerning for possible empyema. Chest tube was placed. Pt had spiking fever and has been treated with IV vancomycin and cefepime. The repeated CXR from today showed possible loculated  effusion. They consulted Dr. Cyndia Bent [facility] [place] [facility], planning decortication procedure on 09/02/2018.  Principal Problem:   Empyema lung ([place]) Active Problems:   Pleural effusion on right   Multifocal pneumonia   Essential hypertension   Tobacco abuse   Sepsis ([place])    Assessment & Plan:   Right pleural effusion/empyema/multifocal pneumonia/sepsis  -Postop day #2 -Status post right (VASTS) thoracotomy/decortication, drain placement for pleural effusion By Ct surgery Dr. Roxan Hockey Chest tube in place, no air leak, -Still complaining of pain with deep inspiration.   Sepsis /due to above -Currently afebrile, normotensive, no leukocytosis -Cultures negative to date -Sepsis protocol was initiated 2.5 L of normal saline bolus was given followed 125 mL/h, broad-spectrum antibiotics  Cefepime and bank  Postop anemia -Hemoglobin dropped from 10 ---> 8.8 today  -Continue to monitor, if symptomatic or hemoglobin drops below 7 we will transfuse   Essential hypertension:  -continue lisinopril -IV hydralazine as needed  Tobacco abuse and vaping THC: Patient states that he quit smoking 3 weeks ago, stopped weeping.  Patient states that he does not need nicotine patch. -Encouraged patient not to restart smoking or vaping.  Lung nodule: CT of chest on 08/30/18 showed persistent nodular opacity in the anterior lingula, 1.8 cm currently. More inferiorly in the left upper lobe, the previously measured 2 cm masslike area  has improved, now 8 mm. 9 mm nodular density peripherally in the left lower lobe. Pt has family history of lung cancer and throat cancer (patient's father), will need to rule out lung cancer. -need to be followed up by PCP -may give referral to oncologist   DVT prophylaxis: SCD/Compression stockings  Code Status:   Code Status: Full Code  Family Communication:  The above findings and plan of care has been discussed with patient and family in detail, they  expressed understanding and agreement of above.  Disposition Plan:  >3 days  Consultants: CT surgery Dr. Roxan Hockey  Procedures:     Thoracotomy/decortication/placement for right pleural effusion/empyema on 09/02/2018  Antimicrobials:  Anti-infectives (From admission, onward)   Start     Dose/Rate Route Frequency Ordered Stop   09/02/18 0000  vancomycin (VANCOCIN) 1,250 mg in sodium chloride 0.9 % 250 mL IVPB     1,250 mg 166.7 mL/hr over 90 Minutes Intravenous Every 8 hours 09/01/18 2140     09/01/18 2200  ceFEPIme (MAXIPIME) 2 g in sodium chloride 0.9 % 100 mL IVPB     2 g 200 mL/hr over 30 Minutes Intravenous Every 8 hours 09/01/18 2140         Medication:  . acetaminophen  1,000 mg Oral Q6H   Or  . acetaminophen (TYLENOL) oral liquid 160 mg/5 mL  1,000 mg Oral Q6H  . albuterol  2.5 mg Nebulization TID  . bisacodyl  10 mg Oral Daily  . enoxaparin (LOVENOX) injection  30 mg Subcutaneous Q24H  . fentaNYL   Intravenous Q4H  . lisinopril  5 mg Oral Daily  . pantoprazole  40 mg Oral Daily  . senna-docusate  1 tablet Oral QHS    diphenhydrAMINE **OR** diphenhydrAMINE, guaiFENesin, hydrALAZINE, naloxone **AND** sodium chloride flush, ondansetron (ZOFRAN) IV, oxyCODONE, potassium chloride, traMADol, zolpidem     Objective:   Vitals:   09/04/18 0112 09/04/18 0338 09/04/18 0349 09/04/18 0750  BP:  (!) 149/81  (!) 151/83  Pulse:  74  65  Resp: 20 (!) 25 (!) 23 18  Temp:  98.4 F (36.9 C)  98.1 F (36.7 C)  TempSrc:  Oral  Oral  SpO2: 90% 93% 95% 98%  Weight:      Height:        Intake/Output Summary (Last 24 hours) at 09/04/2018 1010 Last data filed at 09/04/2018 0655 Gross per 24 hour  Intake 1947.45 ml  Output 1675 ml  Net 272.45 ml   Filed Weights   09/01/18 1940  Weight: 108.4 kg     Examination:   BP (!) 151/83 (BP Location: Right Arm)   Pulse 65   Temp 98.1 F (36.7 C) (Oral)   Resp 18   Ht 6\' 3"  (1.905 m)   Wt 108.4 kg   SpO2 98%   BMI  29.86 kg/m    Physical Exam  Constitution:  Alert, cooperative, no distress,  Psychiatric: Normal and stable mood and affect, cognition intact,   HEENT: Normocephalic, PERRL, otherwise with in Normal limits  Chest:Chest symmetric, right-sided double chest tube in place, dressing in place Cardio vascular:  S1/S2, RRR, No murmure, No Rubs or Gallops  pulmonary: Clear to auscultation bilaterally, respirations unlabored, negative wheezes / crackles Abdomen: Soft, non-tender, non-distended, bowel sounds,no masses, no organomegaly Muscular skeletal: Limited exam - in bed, able to move all 4 extremities, Normal strength,  Neuro: CNII-XII intact. , normal motor and sensation, reflexes intact  Extremities: No pitting edema lower extremities, +2 pulses  Skin: Dry, warm to touch, negative for any Rashes, right chest wall chest tube, wounds: per nursing documentation, none, chest or chest wall chest tube     LABs:  CBC Latest Ref Rng & Units 09/04/2018 09/03/2018 09/03/2018  WBC 4.0 - 10.5 K/uL 8.7 - 10.7(H)  Hemoglobin 13.0 - 17.0 g/dL 8.8(L) 10.0(L) 7.1(L)  Hematocrit 39.0 - 52.0 % 27.7(L) 30.9(L) 22.8(L)  Platelets 150 - 400 K/uL 433(H) - 308   CMP Latest Ref Rng & Units 09/04/2018 09/03/2018 09/02/2018  Glucose 70 - 99 mg/dL 112(H) 144(H) -  BUN 6 - 20 mg/dL 11 8 -  Creatinine 0.61 - 1.24 mg/dL 0.83 0.65 -  Sodium 135 - 145 mmol/L 141 140 144  Potassium 3.5 - 5.1 mmol/L 3.6 4.0 4.1  Chloride 98 - 111 mmol/L 105 109 -  CO2 22 - 32 mmol/L 27 22 -  Calcium 8.9 - 10.3 mg/dL 7.7(L) 7.0(L) -  Total Protein 6.5 - 8.1 g/dL 5.4(L) - -  Total Bilirubin 0.3 - 1.2 mg/dL 0.3 - -  Alkaline Phos 38 - 126 U/L 93 - -  AST 15 - 41 U/L 42(H) - -  ALT 0 - 44 U/L 99(H) - -

## 2018-09-04 NOTE — Progress Notes (Addendum)
[address]       ,[place] [address]             [phone]      2 Days Post-Op Procedure(s) (LRB): VIDEO ASSISTED THORACOSCOPY (VATS)/possible THOROCOTOMY (Right) DECORTICATION (Right) DRAINAGE OF PLEURAL EFFUSION (Right)   Subjective:  Patient with some mild pain.  He is surprised how much this has tired him out.  +ambulation  + BM  Objective: Vital signs in last 24 hours: Temp:  [97.5 F (36.4 C)-98.9 F (37.2 C)] 98.1 F (36.7 C) (09/22 0750) Pulse Rate:  [65-77] 65 (09/22 0750) Cardiac Rhythm: Normal sinus rhythm (09/22 0700) Resp:  [17-30] 18 (09/22 0750) BP: (137-157)/(69-85) 151/83 (09/22 0750) SpO2:  [90 %-100 %] 98 % (09/22 0750)  Intake/Output from previous day: 09/21 0701 - 09/22 0700 In: 2187.7 [P.O.:960; I.V.:527.7; IV Piggyback:700] Out: 1675 [Urine:1575; Chest Tube:100]  General appearance: alert, cooperative and no distress Heart: regular rate and rhythm Lungs: diminished breath sounds bibasilar Abdomen: soft, non-tender; bowel sounds normal; no masses,  no organomegaly Extremities: extremities normal, atraumatic, no cyanosis or edema Wound: clean and dry  Lab Results: Recent Labs    09/03/18 0334 09/03/18 0813 09/04/18 0332  WBC 10.7*  --  8.7  HGB 7.1* 10.0* 8.8*  HCT 22.8* 30.9* 27.7*  PLT 308  --  433*   BMET:  Recent Labs    09/03/18 0334 09/04/18 0332  NA 140 141  K 4.0 3.6  CL 109 105  CO2 22 27  GLUCOSE 144* 112*  BUN 8 11  CREATININE 0.65 0.83  CALCIUM 7.0* 7.7*    PT/INR:  Recent Labs    09/01/18 2152  LABPROT 15.0  INR 1.19   ABG    Component Value Date/Time   PHART 7.335 (L) 09/03/2018 0342   HCO3 17.2 (L) 09/03/2018 0342   TCO2 18 (L) 09/03/2018 0342   ACIDBASEDEF 8.0 (H) 09/03/2018 0342   O2SAT 100.0 09/03/2018 0342   CBG (last 3)  No results for input(s): GLUCAP in the last 72 hours.  Assessment/Plan: S/P Procedure(s) (LRB): VIDEO ASSISTED THORACOSCOPY (VATS)/possible  THOROCOTOMY (Right) DECORTICATION (Right) DRAINAGE OF PLEURAL EFFUSION (Right)  1. Chest tube- no air leak on water seal, per nursing no output overnight (level is currently at 650 in Pleuro[place])- may be able to remove one chest tube soon 2. CV- NSR, + HTN- on Lisinopril 5 mg daily 3. Pulm- off oxygen, CXR with continued right pleural effusion, right basilar opacity/atelectasis, continue IS 4. ID-afebrile, OR cultures remain negative so far, ABX per medicine 5. Dispo- patient stable, minimal CT output, continue IS, ABX, may be able to remove 1 chest tube soon, care per medicine   LOS: 3 days    Ellwood Handler 09/04/2018  hgb stable without transfusion D/c one chest tube today  D/c pca I have seen and examined Roy [name] and agree with the above assessment  and plan.  Grace Isaac MD Beeper [phone] Office [phone] 09/04/2018 11:11 AM    Recent Results (from the past 240 hour(s))  Body fluid culture     Status: None   Collection Time: 08/28/18  7:34 PM  Result Value Ref Range Status   Specimen Description   Final    FLUID PLEURAL RIGHT Performed at [facility], [address], [place],  [address]    Special Requests   Final    NONE Performed at [facility], [place], [place]  [address]    Gram Stain   Final    RARE WBC PRESENT, PREDOMINANTLY MONONUCLEAR NO ORGANISMS SEEN    Culture   Final    NO GROWTH 3 DAYS Performed at [place] [address], [place], [place] [address]    Report Status 09/01/2018 FINAL  Final  Culture, blood (Routine X 2) w Reflex to ID Panel     Status: None (Preliminary result)   Collection Time: 09/01/18  9:50 PM  Result Value Ref Range Status   Specimen Description BLOOD LEFT ANTECUBITAL  Final   Special Requests   Final    BOTTLES DRAWN AEROBIC AND ANAEROBIC Blood Culture adequate volume   Culture   Final    NO GROWTH 3 DAYS Performed at [facility], [place] [address], [place], [place] [address]    Report Status PENDING  Incomplete  Culture, blood (Routine X 2) w Reflex to ID Panel     Status: None (Preliminary result)   Collection Time: 09/01/18  9:52 PM  Result Value Ref Range Status   Specimen Description BLOOD RIGHT HAND  Final   Special Requests   Final    BOTTLES DRAWN AEROBIC ONLY Blood Culture adequate volume   Culture   Final    NO GROWTH 3 DAYS Performed at [facility], [place] [address], [place], [place] [address]    Report Status PENDING  Incomplete  Surgical PCR screen     Status: None   Collection Time: 09/02/18  5:30 AM  Result Value Ref Range Status   MRSA, PCR NEGATIVE NEGATIVE Final   Staphylococcus aureus NEGATIVE NEGATIVE Final    Comment: (NOTE) The Xpert SA Assay (FDA approved for NASAL specimens in patients 69 years of age and older), is one component of a comprehensive surveillance program. It is not intended to diagnose infection nor to guide or monitor treatment. Performed at [facility], [place] [address] [place], [place] [address]   Body fluid culture     Status: None (Preliminary result)   Collection Time: 09/02/18 11:31 AM  Result Value Ref Range Status   Specimen Description FLUID PLEURAL RIGHT  Final   Special Requests NONE  Final   Gram Stain   Final    CYTOSPIN SMEAR WBC PRESENT, PREDOMINANTLY PMN NO ORGANISMS SEEN    Culture   Final    NO GROWTH 2 DAYS Performed at [facility], [place] [address], [place], [place] [address]    Report Status PENDING  Incomplete  Aerobic/Anaerobic Culture (surgical/deep wound)     Status: None (Preliminary result)   Collection Time: 09/02/18 11:31 AM  Result Value Ref Range Status   Specimen Description TISSUE PLEURAL RIGHT  Final   Special Requests SPECIMEN C  Final   Gram Stain   Final    MODERATE WBC PRESENT, PREDOMINANTLY PMN NO ORGANISMS SEEN RESULT CALLED TO, READ BACK BY AND VERIFIED WITH: ROSE RN AT 1300 ON [id number] BY SJW    Culture   Final     NO GROWTH 1 DAY Performed at [facility], [place] [address], [place],  [address]    Report Status PENDING  Incomplete  Acid Fast Smear (AFB)     Status: None   Collection Time: 09/02/18 11:31 AM  Result Value Ref Range Status   AFB Specimen Processing Comment  Final    Comment: Tissue Grinding and Digestion/Decontamination   Acid Fast Smear Negative  Final  Comment: (NOTE) Performed At: [facility] [place], [place] [id number] Rush Farmer MD JI:[id number]    Source (AFB) TISSUE  Final    Comment: PLEURAL RIGHT Performed at [facility], [place] [address] [place], [place] [address]   Acid Fast Smear (AFB)     Status: None   Collection Time: 09/02/18 11:31 AM  Result Value Ref Range Status   AFB Specimen Processing Concentration  Final   Acid Fast Smear Negative  Final    Comment: (NOTE) Performed At: [facility] [place], [place] [id number] Rush Farmer MD [id number]    Source (AFB) FLUID  Final    Comment: PLEURAL RIGHT Performed at [facility], [place] [address] [place], [place] [address]   Aerobic/Anaerobic Culture (surgical/deep wound)     Status: None (Preliminary result)   Collection Time: 09/02/18 11:32 AM  Result Value Ref Range Status   Specimen Description TISSUE PLEURAL RIGHT  Final   Special Requests SPECIMEN D  Final   Gram Stain   Final    MODERATE WBC PRESENT, PREDOMINANTLY PMN NO ORGANISMS SEEN RESULT CALLED TO, READ BACK BY AND VERIFIED WITH: ROSE RN AT 1300 ON [id number] BY SJW    Culture   Final    NO GROWTH 1 DAY Performed at [facility], [place] [address], [place], [place] [address]    Report Status PENDING  Incomplete

## 2018-09-05 ENCOUNTER — Encounter (HOSPITAL_COMMUNITY): Payer: Self-pay | Admitting: Thoracic Surgery (Cardiothoracic Vascular Surgery)

## 2018-09-05 ENCOUNTER — Ambulatory Visit: Payer: Self-pay | Admitting: Internal Medicine

## 2018-09-05 ENCOUNTER — Inpatient Hospital Stay (HOSPITAL_COMMUNITY): Payer: Self-pay

## 2018-09-05 LAB — BODY FLUID CULTURE: CULTURE: NO GROWTH

## 2018-09-05 LAB — BASIC METABOLIC PANEL
Anion gap: 9 (ref 5–15)
BUN: 8 mg/dL (ref 6–20)
CO2: 29 mmol/L (ref 22–32)
Calcium: 8.3 mg/dL — ABNORMAL LOW (ref 8.9–10.3)
Chloride: 100 mmol/L (ref 98–111)
Creatinine, Ser: 0.87 mg/dL (ref 0.61–1.24)
GFR calc Af Amer: >60 mL/min (ref >60)
GFR calc non Af Amer: >60 mL/min (ref >60)
Glucose, Bld: 100 mg/dL — ABNORMAL HIGH (ref 70–99)
Potassium: 3.9 mmol/L (ref 3.5–5.1)
Sodium: 138 mmol/L (ref 135–145)

## 2018-09-05 LAB — CBC
HCT: 29 % — ABNORMAL LOW (ref 39.0–52.0)
HEMOGLOBIN: 9.4 g/dL — AB (ref 13.0–17.0)
MCH: 32.2 pg (ref 26.0–34.0)
MCHC: 32.4 g/dL (ref 30.0–36.0)
MCV: 99.3 fL (ref 78.0–100.0)
PLATELETS: 460 10*3/uL — AB (ref 150–400)
RBC: 2.92 MIL/uL — ABNORMAL LOW (ref 4.22–5.81)
RDW: 12.8 % (ref 11.5–15.5)
WBC: 6.8 10*3/uL (ref 4.0–10.5)

## 2018-09-05 LAB — BPAM RBC
BLOOD PRODUCT EXPIRATION DATE: 201910062359
Blood Product Expiration Date: 201910142359
Unit Type and Rh: 5100
Unit Type and Rh: 5100

## 2018-09-05 LAB — TYPE AND SCREEN
ABO/RH(D): O POS
Antibody Screen: NEGATIVE
UNIT DIVISION: 0
Unit division: 0

## 2018-09-05 MED ORDER — POTASSIUM CHLORIDE CRYS ER 20 MEQ PO TBCR
20.0000 meq | EXTENDED_RELEASE_TABLET | Freq: Once | ORAL | Status: AC
  Administered 2018-09-05: 20 meq via ORAL
  Filled 2018-09-05: qty 1

## 2018-09-05 MED ORDER — KETOROLAC TROMETHAMINE 30 MG/ML IJ SOLN
15.0000 mg | Freq: Four times a day (QID) | INTRAMUSCULAR | Status: DC
  Administered 2018-09-05 – 2018-09-06 (×4): 15 mg via INTRAVENOUS
  Filled 2018-09-05 (×4): qty 1

## 2018-09-05 MED ORDER — LISINOPRIL 10 MG PO TABS
10.0000 mg | ORAL_TABLET | Freq: Every day | ORAL | Status: DC
  Administered 2018-09-05: 10 mg via ORAL
  Filled 2018-09-05: qty 1

## 2018-09-05 MED ORDER — HYDROCHLOROTHIAZIDE 12.5 MG PO CAPS
12.5000 mg | ORAL_CAPSULE | Freq: Every day | ORAL | Status: DC
  Administered 2018-09-05 – 2018-09-07 (×3): 12.5 mg via ORAL
  Filled 2018-09-05 (×3): qty 1

## 2018-09-05 MED ORDER — GUAIFENESIN ER 600 MG PO TB12
600.0000 mg | ORAL_TABLET | Freq: Two times a day (BID) | ORAL | Status: DC
  Administered 2018-09-05 – 2018-09-07 (×5): 600 mg via ORAL
  Filled 2018-09-05 (×5): qty 1

## 2018-09-05 NOTE — Progress Notes (Addendum)
      [address]       [place] [address]             [phone]       3 Days Post-Op Procedure(s) (LRB): VIDEO ASSISTED THORACOSCOPY (VATS)/possible THOROCOTOMY (Right) DECORTICATION (Right) DRAINAGE OF PLEURAL EFFUSION (Right)  Subjective: Patient with incisional pain. He had a lot of pain with chest tube removal yesterday.  Objective: Vital signs in last 24 hours: Temp:  [98 F (36.7 C)-98.6 F (37 C)] 98 F (36.7 C) (09/23 0405) Pulse Rate:  [65-80] 67 (09/23 0405) Cardiac Rhythm: Normal sinus rhythm (09/22 2030) Resp:  [17-27] 17 (09/23 0405) BP: (147-162)/(83-87) 155/86 (09/23 0405) SpO2:  [94 %-99 %] 97 % (09/23 0405)      Intake/Output from previous day: 09/22 0701 - 09/23 0700 In: 2224.5 [P.O.:720; I.V.:72.5; IV Piggyback:1431.9] Out: 1443 [Urine:2925; Chest Tube:250]   Physical Exam:  Cardiovascular: RRR Pulmonary: Clear to auscultation on left and diminished right base Abdomen: Soft, non tender, bowel sounds present. Extremities: No lower extremity edema. Wounds: Clean and dry.  No erythema or signs of infection. Chest Tube: to water seal, no air leak  Lab Results: CBC: Recent Labs    09/04/18 0332 09/05/18 0418  WBC 8.7 6.8  HGB 8.8* 9.4*  HCT 27.7* 29.0*  PLT 433* 460*   BMET:  Recent Labs    09/04/18 0332 09/05/18 0418  NA 141 138  K 3.6 3.9  CL 105 100  CO2 27 29  GLUCOSE 112* 100*  BUN 11 8  CREATININE 0.83 0.87  CALCIUM 7.7* 8.3*    PT/INR: No results for input(s): LABPROT, INR in the last 72 hours. ABG:  INR: Will add last result for INR, ABG once components are confirmed Will add last 4 CBG results once components are confirmed  Assessment/Plan:  1. CV - Hypertensive. On Lisinopril but will increase for better BP control.  2.  Pulmonary - Chest tube with 250 cc last 24 hours. Chest tube is to water seal. There is no air leak. CXR this am appears stable. Chest tube to remain for now. Mucinex for  cough. Encourage incentive spirometer. 3.ID-on Vancomycin and Cefepime for PNA/empyema.Culture shows no growth to date 4. Anemia-H and H 9.4 and 29 5. Supplement potassium 6. Will give 2 doses of Toradol to help with pain. 7. Management per primary, medicine  Donielle M ZimmermanPA-C 09/05/2018,7:04 AM [phone]  Patient seen and examined, agree with above Serous drainage from chest tube- will leave on water seal mobilize

## 2018-09-05 NOTE — Progress Notes (Addendum)
[facility] TEAM 1 - Stepdown/ICU TEAM  Roy [name]  ELF:[id number] DOB: Sep 22, 1971 DOA: 09/01/2018 PCP: Patient, No Pcp Per    Brief Narrative:  47 y.o.malewith a hx ofhypertension, tobacco abuse, vaping THC, brain aneurysm (coiled 7 years ago), and cardiomyopathy who presented with shortness of breath and chest pain. He was admitted to the hospital 9/3 > 9/6 due to multifocal pneumonia.IRwasconsulted for thoracentesison 9/5, but there was not enough fluid to drain. After d/che completed a courseofantibiotics, but developedincreasing shortness of breathand right sided chest pain. He was admitted [facility] again on 08/28/2018, where he was found to havea large right-sided pleural effusion.Ultrasound-guided thoracentesis removed 1.5 L of exudative fluid. Chest tube was placed, but a f/u CXR noted a loculated effusion. The pt was transferred to [facility] for a TCTS evaluation.    Subjective: Resting comfortably in bed.  No sob, n/v, abdom pain.  Some expected chest pain related to chest tube.  Assessment & Plan:  Sepsis due to multifocal pneumonia Cont empiric abx   Right pleural effusion > empyema S/p R thoracotomy/decortication, drain placement - care per TCTS  Postop anemia Hgb holding steady at this time - follow   Essential hypertension BP not at goal - adjust tx and follow   Tobacco abuseand vaping THC states he quit smoking and vaping 3 weeks ago  Lung nodule CT chest 08/30/18 showed persistent nodular opacity in the anterior lingula, 1.8 cm currently - more inferiorly in the left upper lobe, the previously measured 2 cm masslike area has improved, now 8 mm. 9 mm nodular density peripherally in the left lower lobe - hasfamily history of lung cancer and throat cancer (patient's father) - will need outpt f/u imaging to monitor   DVT prophylaxis: lovenox  Code Status: FULL CODE Family Communication:  Disposition Plan: stable for tele bed -  ambulate - care of chest tube per TCTS  Consultants:  TCTS  Antimicrobials:  Cefepime 9/17 > Vanc 9/17 > 9/23  Objective: Blood pressure (!) 155/86, pulse 67, temperature 98 F (36.7 C), temperature source Oral, resp. rate 17, height 6\' 3"  (1.905 m), weight 108.4 kg, SpO2 100 %.  Intake/Output Summary (Last 24 hours) at 09/05/2018 1110 Last data filed at 09/05/2018 5852 Gross per 24 hour  Intake 1984.47 ml  Output 3175 ml  Net -1190.53 ml   Filed Weights   09/01/18 1940  Weight: 108.4 kg    Examination: General: No acute respiratory distress Lungs: poor air movement in R base - no wheezing  Cardiovascular: Regular rate and rhythm without murmur gallop or rub normal S1 and S2 Abdomen: Nontender, nondistended, soft, bowel sounds positive, no rebound, no ascites, no appreciable mass Extremities: No significant cyanosis, clubbing, or edema bilateral lower extremities  CBC: Recent Labs  Lab 09/03/18 0334 09/03/18 0813 09/04/18 0332 09/05/18 0418  WBC 10.7*  --  8.7 6.8  HGB 7.1* 10.0* 8.8* 9.4*  HCT 22.8* 30.9* 27.7* 29.0*  MCV 104.6*  --  100.7* 99.3  PLT 308  --  433* 778*   Basic Metabolic Panel: Recent Labs  Lab 09/03/18 0334 09/04/18 0332 09/05/18 0418  NA 140 141 138  K 4.0 3.6 3.9  CL 109 105 100  CO2 22 27 29   GLUCOSE 144* 112* 100*  BUN 8 11 8   CREATININE 0.65 0.83 0.87  CALCIUM 7.0* 7.7* 8.3*   GFR: Estimated Creatinine Clearance: 138.2 mL/min (by C-G formula based on SCr of 0.87 mg/dL).  Liver Function Tests: Recent Labs  Lab 09/02/18 0403 09/04/18 0332  AST 73* 42*  ALT 147* 99*  ALKPHOS 119 93  BILITOT 0.4 0.3  PROT 6.0* 5.4*  ALBUMIN 1.8* 1.6*    Coagulation Profile: Recent Labs  Lab 09/01/18 2152  INR 1.19    HbA1C: Hgb A1c MFr Bld  Date/Time Value Ref Range Status  08/18/2018 04:01 AM 6.3 (H) 4.8 - 5.6 % Final    Comment:    (NOTE) Pre diabetes:          5.7%-6.4% Diabetes:              >6.4% Glycemic control for    <7.0% adults with diabetes      Recent Results (from the past 240 hour(s))  Body fluid culture     Status: None   Collection Time: 08/28/18  7:34 PM  Result Value Ref Range Status   Specimen Description   Final    FLUID PLEURAL RIGHT Performed at [facility], [address] [place], [place] [address]    Special Requests   Final    NONE Performed at [facility], [address] [place], [place] [address]    Gram Stain   Final    RARE WBC PRESENT, PREDOMINANTLY MONONUCLEAR NO ORGANISMS SEEN    Culture   Final    NO GROWTH 3 DAYS Performed at [facility], [place] [address], [place], [place] [address]    Report Status 09/01/2018 FINAL  Final  Culture, blood (Routine X 2) w Reflex to ID Panel     Status: None (Preliminary result)   Collection Time: 09/01/18  9:50 PM  Result Value Ref Range Status   Specimen Description BLOOD LEFT ANTECUBITAL  Final   Special Requests   Final    BOTTLES DRAWN AEROBIC AND ANAEROBIC Blood Culture adequate volume   Culture   Final    NO GROWTH 4 DAYS Performed at [facility], [place] [address], [place], [place] [address]    Report Status PENDING  Incomplete  Culture, blood (Routine X 2) w Reflex to ID Panel     Status: None (Preliminary result)   Collection Time: 09/01/18  9:52 PM  Result Value Ref Range Status   Specimen Description BLOOD RIGHT HAND  Final   Special Requests   Final    BOTTLES DRAWN AEROBIC ONLY Blood Culture adequate volume   Culture   Final    NO GROWTH 4 DAYS Performed at [facility], [place] [address], [place], [place] [address]    Report Status PENDING  Incomplete  Surgical PCR screen     Status: None   Collection Time: 09/02/18  5:30 AM  Result Value Ref Range Status   MRSA, PCR NEGATIVE NEGATIVE Final   Staphylococcus aureus NEGATIVE NEGATIVE Final    Comment: (NOTE) The Xpert SA Assay (FDA approved for NASAL specimens in patients 31 years of age and older), is one component of  a comprehensive surveillance program. It is not intended to diagnose infection nor to guide or monitor treatment. Performed at [facility], [place] [address], [place],  [address]   Body fluid culture     Status: None (Preliminary result)   Collection Time: 09/02/18 11:31 AM  Result Value Ref Range Status   Specimen Description FLUID PLEURAL RIGHT  Final   Special Requests NONE  Final   Gram Stain   Final    CYTOSPIN SMEAR WBC PRESENT, PREDOMINANTLY PMN NO ORGANISMS SEEN  Culture   Final    NO GROWTH 3 DAYS Performed at [facility], [place] [address], [place], [place] [address]    Report Status PENDING  Incomplete  Aerobic/Anaerobic Culture (surgical/deep wound)     Status: None (Preliminary result)   Collection Time: 09/02/18 11:31 AM  Result Value Ref Range Status   Specimen Description TISSUE PLEURAL RIGHT  Final   Special Requests SPECIMEN C  Final   Gram Stain   Final    MODERATE WBC PRESENT, PREDOMINANTLY PMN NO ORGANISMS SEEN RESULT CALLED TO, READ BACK BY AND VERIFIED WITH: ROSE RN AT 1300 ON 092019 BY SJW    Culture   Final    NO GROWTH 3 DAYS NO ANAEROBES ISOLATED; CULTURE IN PROGRESS FOR 5 DAYS Performed at [facility], [place] [address] [place], [place] [address]    Report Status PENDING  Incomplete  Acid Fast Smear (AFB)     Status: None   Collection Time: 09/02/18 11:31 AM  Result Value Ref Range Status   AFB Specimen Processing Comment  Final    Comment: Tissue Grinding and Digestion/Decontamination   Acid Fast Smear Negative  Final    Comment: (NOTE) Performed At: [facility] [place], [place] [id number] Rush Farmer MD [id number]    Source (AFB) TISSUE  Final    Comment: PLEURAL RIGHT Performed at [facility], [place] [address] [place], [place] [address]   Acid Fast Smear (AFB)     Status: None   Collection Time: 09/02/18 11:31 AM  Result Value Ref Range Status   AFB Specimen Processing  Concentration  Final   Acid Fast Smear Negative  Final    Comment: (NOTE) Performed At: [facility] [place], [place] [id number] Rush Farmer MD [id number]    Source (AFB) FLUID  Final    Comment: PLEURAL RIGHT Performed at [facility], [place] [address] [place], [place] [address]   Aerobic/Anaerobic Culture (surgical/deep wound)     Status: None (Preliminary result)   Collection Time: 09/02/18 11:32 AM  Result Value Ref Range Status   Specimen Description TISSUE PLEURAL RIGHT  Final   Special Requests SPECIMEN D  Final   Gram Stain   Final    MODERATE WBC PRESENT, PREDOMINANTLY PMN NO ORGANISMS SEEN RESULT CALLED TO, READ BACK BY AND VERIFIED WITH: ROSE RN AT 1300 ON 092019 BY SJW    Culture   Final    NO GROWTH 3 DAYS NO ANAEROBES ISOLATED; CULTURE IN PROGRESS FOR 5 DAYS Performed at [facility], [place] [address], [place],  [address]    Report Status PENDING  Incomplete     Scheduled Meds: . acetaminophen  1,000 mg Oral Q6H   Or  . acetaminophen (TYLENOL) oral liquid 160 mg/5 mL  1,000 mg Oral Q6H  . albuterol  2.5 mg Nebulization TID  . bisacodyl  10 mg Oral Daily  . enoxaparin (LOVENOX) injection  30 mg Subcutaneous Q24H  . guaiFENesin  600 mg Oral BID  . ketorolac  15 mg Intravenous Q6H  . lisinopril  10 mg Oral Daily  . pantoprazole  40 mg Oral Daily  . senna-docusate  1 tablet Oral QHS     LOS: 4 days   Cherene Altes, MD [facility] Office  [phone] Pager - Text Page per Amion  If 7PM-7AM, please contact night-coverage per Amion 09/05/2018, 11:10 AM

## 2018-09-05 NOTE — Anesthesia Postprocedure Evaluation (Signed)
Anesthesia Post Note  Patient: Graham L Martinique  Procedure(s) Performed: VIDEO ASSISTED THORACOSCOPY (VATS)/possible THOROCOTOMY (Right Chest) DECORTICATION (Right ) DRAINAGE OF PLEURAL EFFUSION (Right )     Patient location during evaluation: PACU Anesthesia Type: General Level of consciousness: awake and alert Pain management: pain level controlled Vital Signs Assessment: post-procedure vital signs reviewed and stable Respiratory status: spontaneous breathing, nonlabored ventilation, respiratory function stable and patient connected to nasal cannula oxygen Cardiovascular status: blood pressure returned to baseline and stable Postop Assessment: no apparent nausea or vomiting Anesthetic complications: no    Last Vitals:  Vitals:   09/05/18 1808 09/05/18 1920  BP: (!) 141/89 (!) 151/82  Pulse: 61 66  Resp:  18  Temp: 37.4 C 36.9 C  SpO2: 95% 100%    Last Pain:  Vitals:   09/05/18 1920  TempSrc: Oral  PainSc:    Pain Goal: Patients Stated Pain Goal: 2 (09/04/18 0800)               Lidia Collum

## 2018-09-06 ENCOUNTER — Inpatient Hospital Stay (HOSPITAL_COMMUNITY): Payer: Self-pay

## 2018-09-06 DIAGNOSIS — A419 Sepsis, unspecified organism: Principal | ICD-10-CM

## 2018-09-06 LAB — CBC
HCT: 30.5 % — ABNORMAL LOW (ref 39.0–52.0)
Hemoglobin: 10.2 g/dL — ABNORMAL LOW (ref 13.0–17.0)
MCH: 32.6 pg (ref 26.0–34.0)
MCHC: 33.4 g/dL (ref 30.0–36.0)
MCV: 97.4 fL (ref 78.0–100.0)
Platelets: 504 10*3/uL — ABNORMAL HIGH (ref 150–400)
RBC: 3.13 MIL/uL — AB (ref 4.22–5.81)
RDW: 12.9 % (ref 11.5–15.5)
WBC: 7 10*3/uL (ref 4.0–10.5)

## 2018-09-06 LAB — CULTURE, BLOOD (ROUTINE X 2)
CULTURE: NO GROWTH
CULTURE: NO GROWTH
SPECIAL REQUESTS: ADEQUATE
Special Requests: ADEQUATE

## 2018-09-06 MED ORDER — LISINOPRIL 10 MG PO TABS
20.0000 mg | ORAL_TABLET | Freq: Every day | ORAL | Status: DC
  Administered 2018-09-06 – 2018-09-07 (×2): 20 mg via ORAL
  Filled 2018-09-06 (×2): qty 2

## 2018-09-06 MED ORDER — KETOROLAC TROMETHAMINE 30 MG/ML IJ SOLN
15.0000 mg | Freq: Once | INTRAMUSCULAR | Status: AC
  Administered 2018-09-06: 15 mg via INTRAVENOUS
  Filled 2018-09-06: qty 1

## 2018-09-06 NOTE — Progress Notes (Addendum)
      [address]       [place] [address]             [phone]       4 Days Post-Op Procedure(s) (LRB): VIDEO ASSISTED THORACOSCOPY (VATS)/possible THOROCOTOMY (Right) DECORTICATION (Right) DRAINAGE OF PLEURAL EFFUSION (Right)  Subjective: Patient just had chest x ray taken. No specific complaints this am.  Objective: Vital signs in last 24 hours: Temp:  [97.9 F (36.6 C)-99.4 F (37.4 C)] 98.4 F (36.9 C) (09/24 0442) Pulse Rate:  [56-66] 56 (09/24 0442) Cardiac Rhythm: Normal sinus rhythm (09/23 1900) Resp:  [14-18] 14 (09/24 0442) BP: (141-158)/(82-93) 144/85 (09/24 0442) SpO2:  [94 %-100 %] 94 % (09/24 0442) FiO2 (%):  [21 %] 21 % (09/23 2009)      Intake/Output from previous day: 09/23 0701 - 09/24 0700 In: 654.2 [P.O.:480; IV Piggyback:174.2] Out: 2620 [Urine:2550; Chest Tube:70]   Physical Exam:  Cardiovascular: RRR Pulmonary: Clear to auscultation on left and slightly diminished right base Abdomen: Soft, non tender, bowel sounds present. Extremities: No lower extremity edema. Wounds: Clean and dry.  No erythema or signs of infection. Chest Tube: to water seal, no air leak  Lab Results: CBC: Recent Labs    09/05/18 0418 09/06/18 0409  WBC 6.8 7.0  HGB 9.4* 10.2*  HCT 29.0* 30.5*  PLT 460* 504*   BMET:  Recent Labs    09/04/18 0332 09/05/18 0418  NA 141 138  K 3.6 3.9  CL 105 100  CO2 27 29  GLUCOSE 112* 100*  BUN 11 8  CREATININE 0.83 0.87  CALCIUM 7.7* 8.3*    PT/INR: No results for input(s): LABPROT, INR in the last 72 hours. ABG:  INR: Will add last result for INR, ABG once components are confirmed Will add last 4 CBG results once components are confirmed  Assessment/Plan:  1. CV - SB at times. Hypertensive. On Lisinopril 10 mg daily but will increase for better BP control.  2.  Pulmonary - Chest tube with 70 cc last 24 hours. Chest tube is to water seal. There is no air leak. CXR this am appears stable.  Will remove chest tube. Mucinex for cough. Encourage incentive spirometer. 3.ID-on Vancomycin and Cefepime for PNA/empyema.Culture shows no growth to date. Could consider switching to oral Augmentin or choice of antibiotic per medicine 4. Anemia-H and H stable at 10.2  and 30.5 5. Management per primary, medicine  Donielle M ZimmermanPA-C 09/06/2018,7:13 AM [phone]  CXR looks good, minimal drainage and no air leak- will dc chest tube Intraop cultures - no growth Needs to complete treatment of pneumonia. Will defer to [place]. Roxan Hockey, MD [facility] [phone]

## 2018-09-06 NOTE — Progress Notes (Signed)
PROGRESS NOTE        PATIENT DETAILS Name: Roy [name] Age: 47 y.o. Sex: male Date of Birth: [birth year] Admit Date: 09/01/2018 Admitting Physician Ivor Costa, MD GYK:[id number], No Pcp Per  Brief Narrative: Patient is a 47 y.o. 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  Subjective: Feels a whole lot better-hopeful that his right chest tube will be removed today.  Assessment/Plan: Sepsis secondary to pneumonia complicated by loculated pleural effusion and empyema: Sepsis pathophysiology has resolved-remains on empiric cefepime.  Remaining chest tube being discontinued on 9/24.  Suspect can be transitioned to Augmentin on discharge-will require a total of 3 weeks of antimicrobial therapy.  All cultures negative so far.  CT surgery following.  Hypertension: Controlled-continue lisinopril and HCTZ.  GERD: Continue PPI  Tobacco abuse: Counseled  Lung nodule: CT chest on 9/17 showed a persistent nodular opacity in the anterior lingula-1.8 cm, there was also a masslike area in the left upper lobe now 8 mm (previously 2 cm) and a 9 mm nodular density in the left lower lobe.  Given history of tobacco use and family history-will require continued outpatient follow-up.  DVT Prophylaxis: Prophylactic Lovenox   Code Status: Full code   Family Communication: None at bedside  Disposition Plan: Remain inpatient-home likely on 9/25  Antimicrobial agents: Anti-infectives (From admission, onward)   Start     Dose/Rate Route Frequency Ordered Stop   09/02/18 0000  vancomycin (VANCOCIN) 1,250 mg in sodium  chloride 0.9 % 250 mL IVPB  Status:  Discontinued     1,250 mg 166.7 mL/hr over 90 Minutes Intravenous Every 8 hours 09/01/18 2140 09/05/18 1124   09/01/18 2200  ceFEPIme (MAXIPIME) 2 g in sodium chloride 0.9 % 100 mL IVPB     2 g 200 mL/hr over 30 Minutes Intravenous Every 8 hours 09/01/18 2140        Procedures: 9/20>> VATS with right lung decortication and placement of chest tube on 9/20.   CONSULTS: CTVS  Time spent: 25-minutes-Greater than 50% of this time was spent in counseling, explanation of diagnosis, planning of further management, and coordination of care.  MEDICATIONS: Scheduled Meds: . acetaminophen  1,000 mg Oral Q6H   Or  . acetaminophen (TYLENOL) oral liquid 160 mg/5 mL  1,000 mg Oral Q6H  . albuterol  2.5 mg Nebulization TID  . bisacodyl  10 mg Oral Daily  . enoxaparin (LOVENOX) injection  30 mg Subcutaneous Q24H  . guaiFENesin  600 mg Oral BID  . hydrochlorothiazide  12.5 mg Oral Daily  . lisinopril  20 mg Oral Daily  . pantoprazole  40 mg Oral Daily  . senna-docusate  1 tablet Oral QHS   Continuous Infusions: . sodium chloride Stopped (09/04/18 1026)  . ceFEPime (MAXIPIME) IV 2 g (09/06/18 1779)  . potassium chloride     PRN Meds:.hydrALAZINE, ondansetron (ZOFRAN) IV, oxyCODONE, potassium chloride, traMADol, zolpidem   PHYSICAL EXAM: Vital signs: Vitals:   09/06/18 0442 09/06/18 0819 09/06/18 0846 09/06/18 0900  BP: (!) 144/85 139/84    Pulse: (!) 56 Marland Kitchen)  54  76  Resp: 14 18  20   Temp: 98.4 F (36.9 C) 98.1 F (36.7 C)    TempSrc: Oral Oral    SpO2: 94%  100% 96%  Weight:      Height:       Filed Weights   09/01/18 1940  Weight: 108.4 kg   Body mass index is 29.86 kg/m.   General appearance :Awake, alert, not in any distress.  Eyes:.Pink conjunctiva HEENT: Atraumatic and Normocephalic Neck: supple, no JVD. No cervical lymphadenopathy. No thyromegaly Resp:Good air entry bilaterally, no added sounds  CVS: S1 S2 regular, no murmurs.    GI: Bowel sounds present, Non tender and not distended with no gaurding, rigidity or rebound.No organomegaly Extremities: B/L Lower Ext shows no edema, both legs are warm to touch Neurology:  speech clear,Non focal, sensation is grossly intact. Psychiatric: Normal judgment and insight. Alert and oriented x 3. Normal mood. Musculoskeletal:No digital cyanosis Skin:No Rash, warm and dry Wounds:N/A  I have personally reviewed following labs and imaging studies  LABORATORY DATA: CBC: Recent Labs  Lab 09/02/18 0403  09/03/18 0334 09/03/18 0813 09/04/18 0332 09/05/18 0418 09/06/18 0409  WBC 7.9  --  10.7*  --  8.7 6.8 7.0  HGB 10.7*   < > 7.1* 10.0* 8.8* 9.4* 10.2*  HCT 32.6*   < > 22.8* 30.9* 27.7* 29.0* 30.5*  MCV 99.1  --  104.6*  --  100.7* 99.3 97.4  PLT 418*  --  308  --  433* 460* 504*   < > = values in this interval not displayed.    Basic Metabolic Panel: Recent Labs  Lab 09/02/18 0403 09/02/18 1253 09/03/18 0334 09/04/18 0332 09/05/18 0418  NA 138 144 140 141 138  K 4.4 4.1 4.0 3.6 3.9  CL 102  --  109 105 100  CO2 26  --  22 27 29   GLUCOSE 111*  --  144* 112* 100*  BUN 9  --  8 11 8   CREATININE 0.74  --  0.65 0.83 0.87  CALCIUM 8.0*  --  7.0* 7.7* 8.3*    GFR: Estimated Creatinine Clearance: 138.2 mL/min (by C-G formula based on SCr of 0.87 mg/dL).  Liver Function Tests: Recent Labs  Lab 09/02/18 0403 09/04/18 0332  AST 73* 42*  ALT 147* 99*  ALKPHOS 119 93  BILITOT 0.4 0.3  PROT 6.0* 5.4*  ALBUMIN 1.8* 1.6*   No results for input(s): LIPASE, AMYLASE in the last 168 hours. No results for input(s): AMMONIA in the last 168 hours.  Coagulation Profile: Recent Labs  Lab 09/01/18 2152  INR 1.19    Cardiac Enzymes: No results for input(s): CKTOTAL, CKMB, CKMBINDEX, TROPONINI in the last 168 hours.  BNP (last 3 results) No results for input(s): PROBNP in the last 8760 hours.  HbA1C: No results for input(s): HGBA1C in the last 72  hours.  CBG: No results for input(s): GLUCAP in the last 168 hours.  Lipid Profile: No results for input(s): CHOL, HDL, LDLCALC, TRIG, CHOLHDL, LDLDIRECT in the last 72 hours.  Thyroid Function Tests: No results for input(s): TSH, T4TOTAL, FREET4, T3FREE, THYROIDAB in the last 72 hours.  Anemia Panel: No results for input(s): VITAMINB12, FOLATE, FERRITIN, TIBC, IRON, RETICCTPCT in the last 72 hours.  Urine analysis:    Component Value Date/Time   COLORURINE YELLOW (A) 08/17/2018 1635   APPEARANCEUR CLOUDY (A) 08/17/2018 1635   LABSPEC 1.016 08/17/2018 1635   PHURINE 5.0 08/17/2018 1635   GLUCOSEU >=500 (  A) 08/17/2018 1635   HGBUR MODERATE (A) 08/17/2018 1635   BILIRUBINUR NEGATIVE 08/17/2018 1635   KETONESUR NEGATIVE 08/17/2018 1635   PROTEINUR NEGATIVE 08/17/2018 1635   NITRITE NEGATIVE 08/17/2018 1635   LEUKOCYTESUR NEGATIVE 08/17/2018 1635    Sepsis Labs: Lactic Acid, Venous    Component Value Date/Time   LATICACIDVEN 0.6 09/02/2018 0742    MICROBIOLOGY: Recent Results (from the past 240 hour(s))  Body fluid culture     Status: None   Collection Time: 08/28/18  7:34 PM  Result Value Ref Range Status   Specimen Description   Final    FLUID PLEURAL RIGHT Performed at [facility], [address] [place], [place] [address]    Special Requests   Final    NONE Performed at [facility], [place] [place], [place] [address]    Gram Stain   Final    RARE WBC PRESENT, PREDOMINANTLY MONONUCLEAR NO ORGANISMS SEEN    Culture   Final    NO GROWTH 3 DAYS Performed at [facility], [place] [address] [place], [place] [address]    Report Status 09/01/2018 FINAL  Final  Culture, blood (Routine X 2) w Reflex to ID Panel     Status: None   Collection Time: 09/01/18  9:50 PM  Result Value Ref Range Status   Specimen Description BLOOD LEFT ANTECUBITAL  Final   Special Requests   Final    BOTTLES DRAWN AEROBIC AND ANAEROBIC Blood Culture  adequate volume   Culture   Final    NO GROWTH 5 DAYS Performed at [facility], [place] [address], [place], [place] [address]    Report Status 09/06/2018 FINAL  Final  Culture, blood (Routine X 2) w Reflex to ID Panel     Status: None   Collection Time: 09/01/18  9:52 PM  Result Value Ref Range Status   Specimen Description BLOOD RIGHT HAND  Final   Special Requests   Final    BOTTLES DRAWN AEROBIC ONLY Blood Culture adequate volume   Culture   Final    NO GROWTH 5 DAYS Performed at [facility], [place] [address], [place], [place] [address]    Report Status 09/06/2018 FINAL  Final  Surgical PCR screen     Status: None   Collection Time: 09/02/18  5:30 AM  Result Value Ref Range Status   MRSA, PCR NEGATIVE NEGATIVE Final   Staphylococcus aureus NEGATIVE NEGATIVE Final    Comment: (NOTE) The Xpert SA Assay (FDA approved for NASAL specimens in patients 83 years of age and older), is one component of a comprehensive surveillance program. It is not intended to diagnose infection nor to guide or monitor treatment. Performed at [facility], [place] [address] [place], [place] [address]   Body fluid culture     Status: None   Collection Time: 09/02/18 11:31 AM  Result Value Ref Range Status   Specimen Description FLUID PLEURAL RIGHT  Final   Special Requests NONE  Final   Gram Stain   Final    CYTOSPIN SMEAR WBC PRESENT, PREDOMINANTLY PMN NO ORGANISMS SEEN    Culture   Final    NO GROWTH 3 DAYS Performed at [facility], [place] [address] [place], [place] [address]    Report Status 09/05/2018 FINAL  Final  Aerobic/Anaerobic Culture (surgical/deep wound)     Status: None (Preliminary result)   Collection Time: 09/02/18 11:31 AM  Result Value Ref  Range Status   Specimen Description TISSUE PLEURAL RIGHT  Final   Special Requests SPECIMEN C  Final   Gram Stain   Final    MODERATE WBC PRESENT, PREDOMINANTLY PMN NO ORGANISMS SEEN RESULT CALLED TO, READ BACK BY  AND VERIFIED WITH: ROSE RN AT 1300 ON [id number] BY SJW    Culture   Final    NO GROWTH 4 DAYS NO ANAEROBES ISOLATED; CULTURE IN PROGRESS FOR 5 DAYS Performed at [facility], [place] [address], [place], [place] [address]    Report Status PENDING  Incomplete  Acid Fast Smear (AFB)     Status: None   Collection Time: 09/02/18 11:31 AM  Result Value Ref Range Status   AFB Specimen Processing Comment  Final    Comment: Tissue Grinding and Digestion/Decontamination   Acid Fast Smear Negative  Final    Comment: (NOTE) Performed At: [facility] [place], [place] [id number] Rush Farmer MD NI:[id number]    Source (AFB) TISSUE  Final    Comment: PLEURAL RIGHT Performed at [facility], [place] [address], [place], [place] [address]   Acid Fast Smear (AFB)     Status: None   Collection Time: 09/02/18 11:31 AM  Result Value Ref Range Status   AFB Specimen Processing Concentration  Final   Acid Fast Smear Negative  Final    Comment: (NOTE) Performed At: [facility] [place], [place] [id number] Rush Farmer MD [id number]    Source (AFB) FLUID  Final    Comment: PLEURAL RIGHT Performed at [facility], [place] [address], [place], [place] [address]   Aerobic/Anaerobic Culture (surgical/deep wound)     Status: None (Preliminary result)   Collection Time: 09/02/18 11:32 AM  Result Value Ref Range Status   Specimen Description TISSUE PLEURAL RIGHT  Final   Special Requests SPECIMEN D  Final   Gram Stain   Final    MODERATE WBC PRESENT, PREDOMINANTLY PMN NO ORGANISMS SEEN RESULT CALLED TO, READ BACK BY AND VERIFIED WITH: ROSE RN AT 1300 ON [id number] BY SJW    Culture   Final    NO GROWTH 4 DAYS NO ANAEROBES ISOLATED; CULTURE IN PROGRESS FOR 5 DAYS Performed at [facility], [place] [address], [place], [place] [address]    Report Status PENDING  Incomplete    RADIOLOGY STUDIES/RESULTS: Dg Chest 2 View  Result Date:  08/28/2018 CLINICAL DATA:  Shortness of breath EXAM: CHEST - 2 VIEW COMPARISON:  08/19/2018 FINDINGS: Large right pleural effusion that has significantly increased from prior. Left chest is radiographically clear. There were nodular densities on prior chest CT. Normal heart size. IMPRESSION: Large right pleural effusion with significant increase from 08/19/2018. Electronically Signed   By: Monte Fantasia M.D.   On: 08/28/2018 08:00   Dg Chest 2 View  Result Date: 08/19/2018 CLINICAL DATA:  Chest pain and shortness of breath and pneumonia. Current smoker. EXAM: CHEST - 2 VIEW COMPARISON:  Chest x-ray of August 16, 2018 FINDINGS: The lungs are better inflated today. At airspace opacity at the right lung base persists. There is a small right pleural effusion. The interstitial markings are coarse. Subtle nodularity is present in the left upper lobe. The heart is mildly enlarged. The pulmonary vascularity is engorged. IMPRESSION: Right basilar atelectasis or pneumonia with small right pleural effusion. Patchy nodular density in the left upper lobe more conspicuous today may reflect developing pneumonia or atelectasis. Mild CHF. High Electronically Signed  By: David  [name] M.D.   On: 08/19/2018 09:36   Ct Chest W Contrast  Result Date: 08/30/2018 CLINICAL DATA:  Shortness of breath EXAM: CT CHEST WITH CONTRAST TECHNIQUE: Multidetector CT imaging of the chest was performed during intravenous contrast administration. CONTRAST:  27mL OMNIPAQUE IOHEXOL 300 MG/ML  SOLN COMPARISON:  08/16/2018 FINDINGS: Cardiovascular: Heart is upper limits normal in size. Aorta is normal caliber. Mediastinum/Nodes: Small scattered mediastinal lymph nodes, none pathologically enlarged. No mediastinal, hilar, or axillary adenopathy. Lungs/Pleura: Enlarging right effusion, now large. Near complete atelectasis/collapse of the right lower lobe and right middle lobe. Compressive atelectasis in the posterior right upper lobe. Only a small  area of right upper lobe is aerated. 1.8 cm spiculated nodule in the left upper lobe again noted. Previously seen nodular opacity or inferiorly in the left upper lobe currently measures 8 mm on image 95 compared to 2 cm previously. Peripheral nodule measures 9 mm in the left lower lobe on image 135, better seen on today's study due to improved atelectasis at the left base. Upper Abdomen: Imaging into the upper abdomen shows no acute findings. Musculoskeletal: Chest wall soft tissues are unremarkable. No acute bony abnormality. IMPRESSION: Enlarging right pleural effusion, now large with only a small amount of aerated right upper lobe remaining. Compressive atelectasis and/or infiltrate throughout much of the right lung. Persistent nodular opacity in the anterior lingula, 1.8 cm currently. More inferiorly in the left upper lobe, the previously measured 2 cm masslike area has improved, now 8 mm. 9 mm nodular density peripherally in the left lower lobe. Recommend continued follow-up. Electronically Signed   By: Rolm Baptise M.D.   On: 08/30/2018 08:38   Ct Angio Chest Pe W And/or Wo Contrast  Result Date: 08/16/2018 CLINICAL DATA:  Shortness of breath and chest pain since last week while lifting boxes. More severe today. Right chest pain worse with deep breathing. EXAM: CT ANGIOGRAPHY CHEST WITH CONTRAST TECHNIQUE: Multidetector CT imaging of the chest was performed using the standard protocol during bolus administration of intravenous contrast. Multiplanar CT image reconstructions and MIPs were obtained to evaluate the vascular anatomy. CONTRAST:  152mL ISOVUE-370 IOPAMIDOL (ISOVUE-370) INJECTION 76% COMPARISON:  None. FINDINGS: Cardiovascular: Suboptimal contrast bolus limits the examination. No main or lobar pulmonary artery emboli are demonstrated but segmental or below levels are not well evaluated. Normal heart size. No pericardial effusion. Normal caliber thoracic aorta. Great vessel origins are patent.  Mediastinum/Nodes: Right subcarinal lymph node measures up to about 12 mm short axis dimension. No other prominent lymph nodes. Likely reactive. Esophagus is decompressed. Lungs/Pleura: Small right pleural effusion. Bilateral basilar atelectasis or consolidation. Patchy nodular opacities most prominent in the left lung with several discrete nodules identified. Largest is in the left lung base and measures 2 cm diameter. Differential diagnosis would include multifocal pneumonia or metastasis. Short-term follow-up imaging is recommended to exclude metastatic disease. No pneumothorax. Airways are patent. Mild emphysematous changes in the apices. Upper Abdomen: No acute abnormality is identified. Musculoskeletal: Degenerative changes in the spine. No destructive bone lesions. Review of the MIP images confirms the above findings. IMPRESSION: 1. No evidence of significant pulmonary embolus, although poor bolus limits evaluation of segmental and distal pulmonary arteries. 2. Small right pleural effusion with bilateral basilar atelectasis or consolidation. 3. Patchy nodular opacities most prominent in the left lung with several discrete nodules identified in the left lung base. Differential diagnosis would include multifocal pneumonia or metastasis. Short-term follow-up imaging is recommended to exclude metastatic disease. 4. Mild  emphysematous changes in the apices. 5. Borderline enlarged right subcarinal lymph node, likely reactive. Emphysema (ICD10-J43.9). Electronically Signed   By: Lucienne Capers M.D.   On: 08/16/2018 23:48   [place] Chest (pleural Effusion)  Result Date: 08/17/2018 CLINICAL DATA:  47 year old with a right pleural effusion. Evaluate for thoracentesis. EXAM: CHEST ULTRASOUND COMPARISON:  Chest CT 08/16/2018 FINDINGS: Right side of the chest was evaluated with ultrasound. Small right pleural effusion was identified. Not enough fluid for safe thoracentesis. IMPRESSION: Small right pleural effusion.  Not  enough fluid for thoracentesis. Electronically Signed   By: Markus Daft M.D.   On: 08/17/2018 15:24   Dg Chest Port 1 View  Result Date: 09/06/2018 CLINICAL DATA:  Follow-up pneumothorax with right chest tube treatment EXAM: PORTABLE CHEST 1 VIEW COMPARISON:  Portable chest x-ray of September 23rd, 2019 FINDINGS: The left lung is well-expanded and clear. On the right there is a moderate amount of pleural fluid versus pleural thickening along the lateral aspect of the hemithorax. No pneumothorax is observed. The chest tube tip projects over the posteromedial aspect of the right seventh rib. There is no mediastinal shift. There is likely persistent atelectasis in the right middle lobe. The heart and pulmonary vascularity are normal. IMPRESSION: No residual right pneumothorax. The right chest tube is in stable position. Stable pleural thickening versus pleural fluid along the convexity of the right lung. Stable atelectasis in the right middle lobe. Electronically Signed   By: David  [name] M.D.   On: 09/06/2018 10:32   Dg Chest Port 1 View  Result Date: 09/05/2018 CLINICAL DATA:  47 year old male with history of empyema. Follow-up study. EXAM: PORTABLE CHEST 1 VIEW COMPARISON:  Chest x-ray 09/04/2018. FINDINGS: One of the previously noted right-sided chest tubes has been removed. The other right-sided chest tube remains in position with tip in the medial aspect of the right hemithorax. There continues to be a small right-sided pleural effusion, which appears mildly complicated with some fluid tracking into the minor fissure. Ill-defined opacities throughout the right mid to lower lung likely reflect areas of atelectasis and/or airspace consolidation. Left lung is clear. No left pleural effusion. No evidence of pulmonary edema. Heart size is normal. Upper mediastinal contours are within normal limits. IMPRESSION: 1. Removal of 1 of the 2 previously seen right-sided chest tubes. Other chest tube remains stable in  position. Small complex right-sided pleural fluid collection and areas of residual atelectasis and/or consolidation throughout the right mid to lower lung appear slightly improved compared to the prior examination. Electronically Signed   By: Vinnie Langton M.D.   On: 09/05/2018 08:08   Dg Chest Port 1 View  Result Date: 09/04/2018 CLINICAL DATA:  Chest tube EXAM: PORTABLE CHEST 1 VIEW COMPARISON:  09/03/2018 FINDINGS: Two indwelling right chest tubes. No pneumothorax is seen. Small right pleural effusion. Associated right lower lobe opacity, likely atelectasis. Left lung is clear. Cardiomegaly. IMPRESSION: Two indwelling right chest tubes.  No pneumothorax is seen. Small right pleural effusion with associated right lower lobe atelectasis. Electronically Signed   By: Julian Hy M.D.   On: 09/04/2018 08:12   Dg Chest Port 1 View  Result Date: 09/03/2018 CLINICAL DATA:  Lung empyema. EXAM: PORTABLE CHEST 1 VIEW COMPARISON:  Chest x-rays dated 09/02/2018 and 09/01/2018 and chest CT dated 08/30/2018 FINDINGS: Right chest tubes are unchanged with no pneumothorax. Small amount of residual loculated fluid peripherally in the right hemithorax, unchanged. Left lung is clear. Heart size and vascularity are normal. No acute bone abnormality.  IMPRESSION: No significant change. Small amount of residual loculated pleural fluid peripherally in the right hemithorax as described. Electronically Signed   By: Lorriane Shire M.D.   On: 09/03/2018 08:26   Dg Chest Port 1 View  Result Date: 09/02/2018 CLINICAL DATA:  Status post VATS.  History of empyema EXAM: PORTABLE CHEST 1 VIEW COMPARISON:  09/01/2018 FINDINGS: Interval placement of 2 right-sided chest tubes with significant decrease in volume of large loculated right pleural effusion. No complications identified. No left pleural effusion. IMPRESSION: 1. Decrease in volume of loculated right pleural effusion. 2. Two right-sided chest tubes are in place without  evidence for pneumothorax. Electronically Signed   By: Kerby Moors M.D.   On: 09/02/2018 14:05   Dg Chest Port 1 View  Result Date: 09/01/2018 CLINICAL DATA:  Recurrent right pleural effusion. Recently treated for pneumonia now with fever spikes. History of smoking, cardiomyopathy. EXAM: PORTABLE CHEST 1 VIEW COMPARISON:  Portable chest x-ray of August 28, 2018 FINDINGS: There has been interval placement of a pigtail catheter in the lower right pleural space with decreased volume of the right pleural effusion. Considerable fluid persists however especially in the lateral aspect of the hemithorax. Improved aeration of the right lung is seen. There are coarse lung markings at the right base. The left lung is well-expanded. The interstitial markings are mildly prominent though stable. The heart is top-normal in size. There is no significant pulmonary vascular congestion. IMPRESSION: Slight interval decrease in the volume of the right pleural effusion. There may refer there remains considerable pleural fluid on the right which may be loculated. Improved aeration of the right lung. Probable right basilar pneumonia. Electronically Signed   By: David  [name] M.D.   On: 09/01/2018 10:26   Dg Chest Port 1 View  Result Date: 08/28/2018 CLINICAL DATA:  Post right-sided thoracentesis. EXAM: PORTABLE CHEST 1 VIEW COMPARISON:  Chest radiograph - earlier same day FINDINGS: Slight reduction in persistent moderate-to-large potentially partially loculated right-sided effusion post thoracentesis. No pneumothorax. Minimally improved aeration of the right lung with persistent right mid and lower lung consolidative opacities. Grossly unchanged cardiac silhouette and mediastinal contours. The left hemithorax remains well aerated. The left-sided pleural effusion. No evidence of edema. No acute osseus abnormalities. Stigmata of DISH within the thoracic spine. IMPRESSION: Slight reduction in persistent moderate to large sized  right-sided effusion post large volume right-sided thoracentesis. No pneumothorax. Electronically Signed   By: Sandi Mariscal M.D.   On: 08/28/2018 19:29   Dg Chest Portable 1 View  Result Date: 08/16/2018 CLINICAL DATA:  Chest pain and short of breath EXAM: PORTABLE CHEST 1 VIEW COMPARISON:  06/03/2008 FINDINGS: Small right greater than left pleural effusions. Cardiomegaly with vascular congestion. Consolidation at the right middle lobe and right base. No pneumothorax. IMPRESSION: 1. Small right greater than left pleural effusion with consolidation at the right middle lobe and right base. 2. Cardiomegaly with vascular congestion Electronically Signed   By: Donavan Foil M.D.   On: 08/16/2018 22:41   Ct Image Guided Drainage By Percutaneous Catheter  Result Date: 08/30/2018 CLINICAL DATA:  Right lung pneumonia with enlarging parapneumonic effusion despite recent thoracentesis. Request has been made to place a pigtail drainage catheter for evacuation. EXAM: CT GUIDED PLACEMENT OF RIGHT PLEURAL THORACOSTOMY TUBE ANESTHESIA/SEDATION: 2.0 mg IV Versed 100 mcg IV Fentanyl Total Moderate Sedation Time:  18 minutes The patient's level of consciousness and physiologic status were continuously monitored during the procedure by Radiology nursing. PROCEDURE: The procedure, risks, benefits, and alternatives  were explained to the patient. Questions regarding the procedure were encouraged and answered. The patient understands and consents to the procedure. A time out was performed prior to initiating the procedure. The right lateral chest wall was prepped with chlorhexidine in a sterile fashion, and a sterile drape was applied covering the operative field. A sterile gown and sterile gloves were used for the procedure. Local anesthesia was provided with 1% Lidocaine. CT was performed in a supine position with the right side rolled up slightly. After choosing a site for access, an 18 gauge trocar needle was advanced under CT  guidance into the right lower lateral pleural space. After return of fluid, a guidewire was advanced into the pleural space. The tract was dilated and a 12 French percutaneous pigtail drainage catheter was advanced over the wire. Catheter positioning was confirmed by CT. The catheter was connected to a Pleur-evac device. It was secured at the skin with a Prolene retention suture, StatLock device and overlying dressing. COMPLICATIONS: None FINDINGS: CT demonstrates a large right pleural effusion with significant compression of the right lung. After thoracostomy tube placement, there is return of dark yellow, clear fluid. IMPRESSION: Placement 12 French thoracostomy tube into the right pleural space for evacuation of a large parapneumonic effusion. The tube was attached to a Pleur-evac device which will be connected to wall suction at -20 cm of water. Electronically Signed   By: Aletta Edouard M.D.   On: 08/30/2018 16:42   US Thoracentesis Asp Pleural Space W/img Guide  Result Date: 08/28/2018 INDICATION: Symptomatic right-sided pleural effusion. Please from ultrasound-guided thoracentesis for diagnostic and therapeutic purposes. EXAM: US THORACENTESIS ASP PLEURAL SPACE W/IMG GUIDE COMPARISON:  Chest radiograph - 08/28/2018; 08/16/2018; chest CT - 08/16/2018 MEDICATIONS: None. COMPLICATIONS: None immediate. TECHNIQUE: Informed written consent was obtained from the patient after a discussion of the risks, benefits and alternatives to treatment. A timeout was performed prior to the initiation of the procedure. Initial ultrasound scanning demonstrates a large anechoic right-sided pleural effusion. The lower chest was prepped and draped in the usual sterile fashion. 1% lidocaine was used for local anesthesia. An ultrasound image was saved for documentation purposes. An 8 Fr Safe-T-Centesis catheter was introduced. The thoracentesis was performed. The catheter was removed and a dressing was applied. The patient  tolerated the procedure well without immediate post procedural complication. The patient was escorted to have an upright chest radiograph. FINDINGS: A total of approximately 1.5 liters of serous fluid was removed. Requested samples were sent to the laboratory. IMPRESSION: Successful ultrasound-guided right sided thoracentesis yielding 1.5 liters of pleural fluid. Electronically Signed   By: Sandi Mariscal M.D.   On: 08/28/2018 19:29     LOS: 5 days   Oren Binet, MD  [facility]  If 7PM-7AM, please contact night-coverage  Please page via [email or web address] [email or web address]-click on MD name and type text message  09/06/2018, 11:12 AM

## 2018-09-07 ENCOUNTER — Inpatient Hospital Stay (HOSPITAL_COMMUNITY): Payer: Self-pay

## 2018-09-07 LAB — CBC
HEMATOCRIT: 30 % — AB (ref 39.0–52.0)
HEMOGLOBIN: 10 g/dL — AB (ref 13.0–17.0)
MCH: 32.5 pg (ref 26.0–34.0)
MCHC: 33.3 g/dL (ref 30.0–36.0)
MCV: 97.4 fL (ref 78.0–100.0)
Platelets: 571 10*3/uL — ABNORMAL HIGH (ref 150–400)
RBC: 3.08 MIL/uL — ABNORMAL LOW (ref 4.22–5.81)
RDW: 12.9 % (ref 11.5–15.5)
WBC: 6.7 10*3/uL (ref 4.0–10.5)

## 2018-09-07 LAB — AEROBIC/ANAEROBIC CULTURE (SURGICAL/DEEP WOUND)

## 2018-09-07 LAB — AEROBIC/ANAEROBIC CULTURE W GRAM STAIN (SURGICAL/DEEP WOUND)
Culture: NO GROWTH
Culture: NO GROWTH

## 2018-09-07 MED ORDER — GUAIFENESIN ER 600 MG PO TB12: 600.0000 mg | ORAL_TABLET | Freq: Two times a day (BID) | ORAL | 1 refills | Status: DC

## 2018-09-07 MED ORDER — LACTINEX PO CHEW: 1.0000 | CHEWABLE_TABLET | Freq: Three times a day (TID) | ORAL | 0 refills | Status: DC

## 2018-09-07 MED ORDER — OXYCODONE HCL 5 MG PO TABS: ORAL_TABLET | ORAL | 0 refills | Status: DC

## 2018-09-07 MED ORDER — AMLODIPINE BESYLATE 5 MG PO TABS: 5.0000 mg | ORAL_TABLET | Freq: Every day | ORAL | 2 refills | Status: DC

## 2018-09-07 MED ORDER — HYDROXYZINE HCL 25 MG PO TABS: 25.0000 mg | ORAL_TABLET | Freq: Four times a day (QID) | ORAL | 0 refills | Status: DC | PRN

## 2018-09-07 MED ORDER — AMOXICILLIN-POT CLAVULANATE 875-125 MG PO TABS: 1.0000 | ORAL_TABLET | Freq: Two times a day (BID) | ORAL | 0 refills | Status: AC

## 2018-09-07 MED ORDER — ALBUTEROL SULFATE (2.5 MG/3ML) 0.083% IN NEBU: 2.5000 mg | INHALATION_SOLUTION | RESPIRATORY_TRACT | Status: DC | PRN

## 2018-09-07 MED ORDER — TRAZODONE HCL 100 MG PO TABS: 100.0000 mg | ORAL_TABLET | Freq: Every evening | ORAL | 2 refills | Status: DC | PRN

## 2018-09-07 MED ORDER — ACETAMINOPHEN 500 MG PO TABS: 1000.0000 mg | ORAL_TABLET | Freq: Four times a day (QID) | ORAL | 0 refills | Status: AC | PRN

## 2018-09-07 MED ORDER — ALBUTEROL SULFATE (2.5 MG/3ML) 0.083% IN NEBU: 2.5000 mg | INHALATION_SOLUTION | RESPIRATORY_TRACT | 12 refills | Status: DC | PRN

## 2018-09-07 NOTE — Progress Notes (Addendum)
      [address]       [place] [address]             [phone]       5 Days Post-Op Procedure(s) (LRB): VIDEO ASSISTED THORACOSCOPY (VATS)/possible THOROCOTOMY (Right) DECORTICATION (Right) DRAINAGE OF PLEURAL EFFUSION (Right)  Subjective: Patient had bowel movement yesterday. He states dressing is itching him this am.  Objective: Vital signs in last 24 hours: Temp:  [98 F (36.7 C)-98.2 F (36.8 C)] 98.2 F (36.8 C) (09/25 0405) Pulse Rate:  [54-88] 62 (09/25 0405) Cardiac Rhythm: Normal sinus rhythm (09/25 0405) Resp:  [13-27] 19 (09/25 0405) BP: (133-162)/(80-98) 133/80 (09/25 0405) SpO2:  [96 %-100 %] 96 % (09/25 0405)      Intake/Output from previous day: 09/24 0701 - 09/25 0700 In: 2060.5 [P.O.:1760; IV Piggyback:300.5] Out: 3725 [Urine:3725]   Physical Exam:  Cardiovascular: RRR Pulmonary: Clear to auscultation on left and slightly diminished right base Abdomen: Soft, non tender, bowel sounds present. Extremities: No lower extremity edema. Wounds: Clean and dry.  No erythema or signs of infection.   Lab Results: CBC: Recent Labs    09/06/18 0409 09/07/18 0334  WBC 7.0 6.7  HGB 10.2* 10.0*  HCT 30.5* 30.0*  PLT 504* 571*   BMET:  Recent Labs    09/05/18 0418  NA 138  K 3.9  CL 100  CO2 29  GLUCOSE 100*  BUN 8  CREATININE 0.87  CALCIUM 8.3*    PT/INR: No results for input(s): LABPROT, INR in the last 72 hours. ABG:  INR: Will add last result for INR, ABG once components are confirmed Will add last 4 CBG results once components are confirmed  Assessment/Plan:  1. CV - SB at times. Hypertensive. On Lisinopril 20 mg daily. 2.  Pulmonary - On room air. CXR this am appears stable (no pneumothorax).  Mucinex for cough. Encourage incentive spirometer. 3.ID-on  Cefepime for PNA/empyema.Culture shows no growth to date. Could consider switching to oral Augmentin or choice of antibiotic per medicine 4. Anemia-H and H  stable at 10.2  and 30.5 5. Follow up appointment arranged. Management per primary, medicine  Donielle M ZimmermanPA-C 09/07/2018,7:16 AM [phone]  Patient seen and examined, agree with above [facility] for dc from a surgical standpoint Would transition to PO antibiotics and complete a couple of more weeks as an outpatient  Remo Lipps C. Roxan Hockey, MD [facility] [phone]

## 2018-09-07 NOTE — Discharge Instructions (Signed)
1) avoid tobacco exposure 2) follow-up with cardiothoracic surgeon as outpatient as advised 3) take medications as prescribed 4) eat yogurt and take lactobacillus probiotic supplements to prevent antibiotic associated diarrhea 5) call or return if excessive diarrhea abdominal pain or fevers     Thoracoscopy, Care After Refer to this sheet in the next few weeks. These instructions provide you with information about caring for yourself after your procedure. Your health care provider may also give you more specific instructions. Your treatment has been planned according to current medical practices, but problems sometimes occur. Call your health care provider if you have any problems or questions after your procedure. What can I expect after the procedure? After your procedure, it is common to feel sore for up to two weeks. Follow these instructions at home:  There are many different ways to close and cover an incision, including stitches (sutures), skin glue, and adhesive strips. Follow your health care provider's instructions about: ? Incision care. ? Bandage (dressing) changes and removal. ? Incision closure removal.  Check your incision area every day for signs of infection. Watch for: ? Redness, swelling, or pain. ? Fluid, blood, or pus.  Take medicines only as directed by your health care provider.  Try to cough often. Coughing helps to protect against lung infection (pneumonia). It may hurt to cough. If this happens, hold a pillow against your chest when you cough.  Take deep breaths. This also helps to protect against pneumonia.  If you were given an incentive spirometer, use it as directed by your health care provider.  Do not take baths, swim, or use a hot tub until your health care provider approves. You may take showers.  Avoid lifting until your health care provider approves.  Avoid driving until your health care provider approves.  Do not travel by airplane after the  chest tube is removed until your health care provider approves. Contact a health care provider if:  You have a fever.  Pain medicines do not ease your pain.  You have redness, swelling, or increasing pain in your incision area.  You develop a cough that does not go away, or you are coughing up mucus that is yellow or green. Get help right away if:  You have fluid, blood, or pus coming from your incision.  There is a bad smell coming from your incision or dressing.  You develop a rash.  You have difficulty breathing.  You cough up blood.  You develop light-headedness or you feel faint.  You develop chest pain.  Your heartbeat feels irregular or very fast. This information is not intended to replace advice given to you by your health care provider. Make sure you discuss any questions you have with your health care provider. Document Released: 06/19/2005 Document Revised: 08/02/2016 Document Reviewed: 08/15/2014 Elsevier Interactive Patient Education  2018 [employer].   1) avoid tobacco exposure 2) follow-up with cardiothoracic surgeon as outpatient as advised 3) take medications as prescribed 4) eat yogurt and take lactobacillus probiotic supplements to prevent antibiotic associated diarrhea 5) call or return if excessive diarrhea abdominal pain or fevers

## 2018-09-07 NOTE — Care Management Note (Addendum)
Case Management Note Marvetta Gibbons RN, BSN Unit 4E- RN Care Coordinator  [phone]  Patient Details  Name: Roy [name] MRN: [id number] Date of Birth: [birth year]  Subjective/Objective:    Pt admitted with empyema s/p VATS                Action/Plan: PTA pt independent, visiting here from [place] area. Pt was given [facility] letter on last admission 9/6 and used letter to fill medications at that time, which means he is not eligible for MATCH at this time. Per conversation with pt at bedside- he still has some of the lisinopril left at home. Explained to pt use of [employer] letter (one time in 12 mo rolling period)- reviewed pt's meds- 2 are on $4 list, controlled substances are not eligible for assistance and pt is to go home on Augmentin. CM will provide pt with coupon to use for abx which pt states he will be able to afford cost. Pt reports he will use [facility] to fill.   Expected Discharge Date:     09/07/18             Expected Discharge Plan:  Home/Self Care  In-House Referral:  NA  Discharge planning Services  CM Consult, Medication Assistance  Post Acute Care Choice:  NA Choice offered to:  NA  DME Arranged:    DME Agency:     HH Arranged:    HH Agency:     Status of Service:  Completed, signed off  If discussed at [place] of Stay Meetings, dates discussed:    Discharge Disposition: home/self care   Additional Comments:  Dawayne Patricia, RN 09/07/2018, 4:32 PM

## 2018-09-07 NOTE — Discharge Summary (Signed)
Roy [name], is a 47 y.o. male  DOB Aug 08, 1971  MRN [id number].  Admission date:  09/01/2018  Admitting Physician  Ivor Costa, MD  Discharge Date:  09/07/2018   Primary MD  Patient, No Pcp Per  Recommendations for primary care physician for things to follow:   1) avoid tobacco exposure 2) follow-up with cardiothoracic surgeon as outpatient as advised 3) take medications as prescribed 4) eat yogurt and take lactobacillus probiotic supplements to prevent antibiotic associated diarrhea 5) call or return if excessive diarrhea abdominal pain or fevers   Admission Diagnosis  EMPYEMA   Discharge Diagnosis  EMPYEMA    Principal Problem:   Empyema lung ([place]) Active Problems:   Pleural effusion on right   Multifocal pneumonia   Essential hypertension   Tobacco abuse   Sepsis ([place])      Past Medical History:  Diagnosis Date  . Aneurysm ([place])   . Cardiomyopathy ([place])    pt sts he no longer has it  . Hypertension   . Renal calculi   . Tobacco abuse     Past Surgical History:  Procedure Laterality Date  . ANEURYSM COILING     "a few years ago"  . DECORTICATION Right 09/02/2018   Procedure: DECORTICATION;  Surgeon: Melrose Nakayama, MD;  Location: [facility];  Service: Thoracic;  Laterality: Right;  . PLEURAL EFFUSION DRAINAGE Right 09/02/2018   Procedure: DRAINAGE OF PLEURAL EFFUSION;  Surgeon: Melrose Nakayama, MD;  Location: [facility];  Service: Thoracic;  Laterality: Right;  Marland Kitchen VIDEO ASSISTED THORACOSCOPY (VATS)/THOROCOTOMY Right 09/02/2018   Procedure: VIDEO ASSISTED THORACOSCOPY (VATS)/possible THOROCOTOMY;  Surgeon: Melrose Nakayama, MD;  Location: [facility];  Service: Thoracic;  Laterality: Right;       HPI  from the history and physical done on the day of admission:    Patient coming from:  The patient is coming from home.  At baseline, pt is independent for most of  ADL.  Chief Complaint: SOB and chest pain  HPI: Roy [name] is a 47 y.o. male with medical history significant of hypertension, tobacco abuse, vaping THC, brain aneurysm (coiled 7 years ago), cardiomyopathy, who presents with shortness of breath and chest pain.  Pt is transferred from [facility].  Patient was recently admitted to the hospital from a 9/3-9/6 because of acute hypoxia respiratory failure, which was likely due to multifocal pneumonia. Pt was initially treated with vanc, meropenem, and azithromycin; transitioned to CTX/azithro 9/4,thenchangedto po omnicef and azithromycin total 5 day course at discharge. Per discharge summary, IR was consulted for thoracentesison 9/5, but not enough fluid to drain.  Patient had negative respiratory virus panel, urine antigen for Legionella and strep.   Pt states that he completed the course of antibiotics, but developed increasing shortness of breath and right sided chest pain. Pt was admitted to [facility] on 08/28/2018. He was found to have large right-sided pleural effusion. Ultrasound-guided thoracentesis removed 1.5 L of fluid, which appears exudative, with elevated protein, but gram  stain negative, concerning for possible empyema. Chest tube was placed. Pt had spiking fever and has been treated with IV vancomycin and cefepime. The repeated CXR from today showed possible loculated effusion. They consulted Dr. Cyndia Bent [facility] [place] [facility], planning decortication procedure tomorrow.   When I saw pt on the floor, he states that he still have shortness of breath, mild cough with clear-yellow mucus production. No subjective fever or chills.  His temperature is 99.1 currently.  He has had right-sided chest pain, which is constant, 7 out of 10 severity, nonradiating, aggravated by deep breath and coughing.  Denies nausea vomiting, diarrhea, abdominal pain, symptoms of UTI or unilateral weakness.  Patient  states that he quit smoking 3 weeks ago, stopped vaping. He states that he does not need nicotine patch.      Hospital Course:      Brief Narrative: Patient is a 47 y.o. male with prior history of hypertension, tobacco use, brain aneurysm (cord 7 years back) recently treated for pneumonia earlier this month (9/3> 9/6)-admitted to [facility] on 9/15 for shortness of breath/right-sided chest-found to have a loculated pleural effusion-and transferred to [facility] for CT surgery evaluation, underwent VATS with right lung decortication and placement of chest tube on 09/02/18  Subjective: Feeling fine eager to go home, happy that chest tube was removed 09/06/18  Assessment/Plan: Sepsis secondary to pneumonia complicated by loculated pleural effusion and empyema: Sepsis pathophysiology has resolved-was treated with vancomycin and cefepime. As per CT surgeon okay to discharge home on Augmentin for 2 additional weeks.   All cultures negative so far.  CT surgery following.  Hypertension: Controlled-discharge home on amlodipine  GERD: Continue PPI  Tobacco abuse: Counseled, not ready to quit smoking  Lung nodule: CT chest on 9/17 showed a persistent nodular opacity in the anterior lingula-1.8 cm, there was also a masslike area in the left upper lobe now 8 mm (previously 2 cm) and a 9 mm nodular density in the left lower lobe.  Given history of tobacco use and family history-will require continued outpatient follow-up.  Patient verbalizes understanding  H/o anxiety and insomnia----may use hydroxyzine for anxiety, trazodone for insomnia  Discharge Condition: stable  Follow UP  Follow-up Information    Melrose Nakayama, MD. Go on 09/27/2018.   Specialty:  Cardiothoracic Surgery Why:  PA/LAT CXR to be taken (at [facility] which is in the same building as Dr. Leonarda Salon office) on 09/27/2018 at 3:30 pm;Appointment time is at 4:00 pm Contact information: [place] [address] [phone]            Consults obtained - CT surgery  Diet and Activity recommendation:  As advised  Discharge Instructions    Discharge Instructions    Call MD for:  difficulty breathing, headache or visual disturbances   Complete by:  As directed    Call MD for:  persistant dizziness or light-headedness   Complete by:  As directed    Call MD for:  persistant nausea and vomiting   Complete by:  As directed    Call MD for:  severe uncontrolled pain   Complete by:  As directed    Call MD for:  temperature >100.4   Complete by:  As directed    Diet - low sodium heart healthy   Complete by:  As directed    Discharge instructions   Complete by:  As directed    1) avoid tobacco exposure 2) follow-up with cardiothoracic  surgeon as outpatient as advised 3) take medications as prescribed 4) eat yogurt and take lactobacillus probiotic supplements to prevent antibiotic associated diarrhea 5) call or return if excessive diarrhea abdominal pain or fevers   Increase activity slowly   Complete by:  As directed        Discharge Medications     Allergies as of 09/07/2018   No Known Allergies     Medication List    STOP taking these medications   ibuprofen 800 MG tablet Commonly known as:  ADVIL,MOTRIN   lisinopril 5 MG tablet Commonly known as:  PRINIVIL,ZESTRIL     TAKE these medications   acetaminophen 500 MG tablet Commonly known as:  TYLENOL Take 2 tablets (1,000 mg total) by mouth every 6 (six) hours as needed for mild pain or fever.   albuterol (2.5 MG/3ML) 0.083% nebulizer solution Commonly known as:  PROVENTIL Take 3 mLs (2.5 mg total) by nebulization every 4 (four) hours as needed for wheezing or shortness of breath.   amLODipine 5 MG tablet Commonly known as:  NORVASC Take 1 tablet (5 mg total) by mouth daily. For Blood Pressure   amoxicillin-clavulanate 875-125 MG tablet Commonly known as:  AUGMENTIN Take 1 tablet  by mouth 2 (two) times daily for 14 days.   guaiFENesin 600 MG 12 hr tablet Commonly known as:  MUCINEX Take 1 tablet (600 mg total) by mouth 2 (two) times daily.   hydrOXYzine 25 MG tablet Commonly known as:  ATARAX/VISTARIL Take 1 tablet (25 mg total) by mouth every 6 (six) hours as needed for anxiety.   lactobacillus acidophilus & bulgar chewable tablet Chew 1 tablet by mouth 3 (three) times daily with meals.   traZODone 100 MG tablet Commonly known as:  DESYREL Take 1 tablet (100 mg total) by mouth at bedtime as needed for sleep. As needed For sleep and anxiety       Major procedures and Radiology Reports - PLEASE review detailed and final reports for all details, in brief -   Dg Chest 2 View  Result Date: 09/07/2018 CLINICAL DATA:  Chest tube removal EXAM: CHEST - 2 VIEW COMPARISON:  09/06/2018 FINDINGS: Right chest tube removed. No ensuing pneumothorax. Heterogeneous opacities throughout the right lung with pleural changes at the right base are stable. Left lung remains clear. Normal heart size. IMPRESSION: Right chest tube removed without ensuing pneumothorax. Electronically Signed   By: Marybelle Killings M.D.   On: 09/07/2018 10:51   Dg Chest 2 View  Result Date: 08/28/2018 CLINICAL DATA:  Shortness of breath EXAM: CHEST - 2 VIEW COMPARISON:  08/19/2018 FINDINGS: Large right pleural effusion that has significantly increased from prior. Left chest is radiographically clear. There were nodular densities on prior chest CT. Normal heart size. IMPRESSION: Large right pleural effusion with significant increase from 08/19/2018. Electronically Signed   By: Monte Fantasia M.D.   On: 08/28/2018 08:00   Dg Chest 2 View  Result Date: 08/19/2018 CLINICAL DATA:  Chest pain and shortness of breath and pneumonia. Current smoker. EXAM: CHEST - 2 VIEW COMPARISON:  Chest x-ray of August 16, 2018 FINDINGS: The lungs are better inflated today. At airspace opacity at the right lung base persists.  There is a small right pleural effusion. The interstitial markings are coarse. Subtle nodularity is present in the left upper lobe. The heart is mildly enlarged. The pulmonary vascularity is engorged. IMPRESSION: Right basilar atelectasis or pneumonia with small right pleural effusion. Patchy nodular density in the left upper lobe  more conspicuous today may reflect developing pneumonia or atelectasis. Mild CHF. High Electronically Signed   By: David  [name] M.D.   On: 08/19/2018 09:36   Ct Chest W Contrast  Result Date: 08/30/2018 CLINICAL DATA:  Shortness of breath EXAM: CT CHEST WITH CONTRAST TECHNIQUE: Multidetector CT imaging of the chest was performed during intravenous contrast administration. CONTRAST:  41mL OMNIPAQUE IOHEXOL 300 MG/ML  SOLN COMPARISON:  08/16/2018 FINDINGS: Cardiovascular: Heart is upper limits normal in size. Aorta is normal caliber. Mediastinum/Nodes: Small scattered mediastinal lymph nodes, none pathologically enlarged. No mediastinal, hilar, or axillary adenopathy. Lungs/Pleura: Enlarging right effusion, now large. Near complete atelectasis/collapse of the right lower lobe and right middle lobe. Compressive atelectasis in the posterior right upper lobe. Only a small area of right upper lobe is aerated. 1.8 cm spiculated nodule in the left upper lobe again noted. Previously seen nodular opacity or inferiorly in the left upper lobe currently measures 8 mm on image 95 compared to 2 cm previously. Peripheral nodule measures 9 mm in the left lower lobe on image 135, better seen on today's study due to improved atelectasis at the left base. Upper Abdomen: Imaging into the upper abdomen shows no acute findings. Musculoskeletal: Chest wall soft tissues are unremarkable. No acute bony abnormality. IMPRESSION: Enlarging right pleural effusion, now large with only a small amount of aerated right upper lobe remaining. Compressive atelectasis and/or infiltrate throughout much of the right lung.  Persistent nodular opacity in the anterior lingula, 1.8 cm currently. More inferiorly in the left upper lobe, the previously measured 2 cm masslike area has improved, now 8 mm. 9 mm nodular density peripherally in the left lower lobe. Recommend continued follow-up. Electronically Signed   By: Rolm Baptise M.D.   On: 08/30/2018 08:38   Ct Angio Chest Pe W And/or Wo Contrast  Result Date: 08/16/2018 CLINICAL DATA:  Shortness of breath and chest pain since last week while lifting boxes. More severe today. Right chest pain worse with deep breathing. EXAM: CT ANGIOGRAPHY CHEST WITH CONTRAST TECHNIQUE: Multidetector CT imaging of the chest was performed using the standard protocol during bolus administration of intravenous contrast. Multiplanar CT image reconstructions and MIPs were obtained to evaluate the vascular anatomy. CONTRAST:  168mL ISOVUE-370 IOPAMIDOL (ISOVUE-370) INJECTION 76% COMPARISON:  None. FINDINGS: Cardiovascular: Suboptimal contrast bolus limits the examination. No main or lobar pulmonary artery emboli are demonstrated but segmental or below levels are not well evaluated. Normal heart size. No pericardial effusion. Normal caliber thoracic aorta. Great vessel origins are patent. Mediastinum/Nodes: Right subcarinal lymph node measures up to about 12 mm short axis dimension. No other prominent lymph nodes. Likely reactive. Esophagus is decompressed. Lungs/Pleura: Small right pleural effusion. Bilateral basilar atelectasis or consolidation. Patchy nodular opacities most prominent in the left lung with several discrete nodules identified. Largest is in the left lung base and measures 2 cm diameter. Differential diagnosis would include multifocal pneumonia or metastasis. Short-term follow-up imaging is recommended to exclude metastatic disease. No pneumothorax. Airways are patent. Mild emphysematous changes in the apices. Upper Abdomen: No acute abnormality is identified. Musculoskeletal: Degenerative  changes in the spine. No destructive bone lesions. Review of the MIP images confirms the above findings. IMPRESSION: 1. No evidence of significant pulmonary embolus, although poor bolus limits evaluation of segmental and distal pulmonary arteries. 2. Small right pleural effusion with bilateral basilar atelectasis or consolidation. 3. Patchy nodular opacities most prominent in the left lung with several discrete nodules identified in the left lung base. Differential diagnosis would  include multifocal pneumonia or metastasis. Short-term follow-up imaging is recommended to exclude metastatic disease. 4. Mild emphysematous changes in the apices. 5. Borderline enlarged right subcarinal lymph node, likely reactive. Emphysema (ICD10-J43.9). Electronically Signed   By: Lucienne Capers M.D.   On: 08/16/2018 23:48   [place] Chest (pleural Effusion)  Result Date: 08/17/2018 CLINICAL DATA:  47 year old with a right pleural effusion. Evaluate for thoracentesis. EXAM: CHEST ULTRASOUND COMPARISON:  Chest CT 08/16/2018 FINDINGS: Right side of the chest was evaluated with ultrasound. Small right pleural effusion was identified. Not enough fluid for safe thoracentesis. IMPRESSION: Small right pleural effusion.  Not enough fluid for thoracentesis. Electronically Signed   By: Markus Daft M.D.   On: 08/17/2018 15:24   Dg Chest Port 1 View  Result Date: 09/06/2018 CLINICAL DATA:  Follow-up pneumothorax with right chest tube treatment EXAM: PORTABLE CHEST 1 VIEW COMPARISON:  Portable chest x-ray of September 23rd, 2019 FINDINGS: The left lung is well-expanded and clear. On the right there is a moderate amount of pleural fluid versus pleural thickening along the lateral aspect of the hemithorax. No pneumothorax is observed. The chest tube tip projects over the posteromedial aspect of the right seventh rib. There is no mediastinal shift. There is likely persistent atelectasis in the right middle lobe. The heart and pulmonary vascularity  are normal. IMPRESSION: No residual right pneumothorax. The right chest tube is in stable position. Stable pleural thickening versus pleural fluid along the convexity of the right lung. Stable atelectasis in the right middle lobe. Electronically Signed   By: David  [name] M.D.   On: 09/06/2018 10:32   Dg Chest Port 1 View  Result Date: 09/05/2018 CLINICAL DATA:  47 year old male with history of empyema. Follow-up study. EXAM: PORTABLE CHEST 1 VIEW COMPARISON:  Chest x-ray 09/04/2018. FINDINGS: One of the previously noted right-sided chest tubes has been removed. The other right-sided chest tube remains in position with tip in the medial aspect of the right hemithorax. There continues to be a small right-sided pleural effusion, which appears mildly complicated with some fluid tracking into the minor fissure. Ill-defined opacities throughout the right mid to lower lung likely reflect areas of atelectasis and/or airspace consolidation. Left lung is clear. No left pleural effusion. No evidence of pulmonary edema. Heart size is normal. Upper mediastinal contours are within normal limits. IMPRESSION: 1. Removal of 1 of the 2 previously seen right-sided chest tubes. Other chest tube remains stable in position. Small complex right-sided pleural fluid collection and areas of residual atelectasis and/or consolidation throughout the right mid to lower lung appear slightly improved compared to the prior examination. Electronically Signed   By: Vinnie Langton M.D.   On: 09/05/2018 08:08   Dg Chest Port 1 View  Result Date: 09/04/2018 CLINICAL DATA:  Chest tube EXAM: PORTABLE CHEST 1 VIEW COMPARISON:  09/03/2018 FINDINGS: Two indwelling right chest tubes. No pneumothorax is seen. Small right pleural effusion. Associated right lower lobe opacity, likely atelectasis. Left lung is clear. Cardiomegaly. IMPRESSION: Two indwelling right chest tubes.  No pneumothorax is seen. Small right pleural effusion with associated right  lower lobe atelectasis. Electronically Signed   By: Julian Hy M.D.   On: 09/04/2018 08:12   Dg Chest Port 1 View  Result Date: 09/03/2018 CLINICAL DATA:  Lung empyema. EXAM: PORTABLE CHEST 1 VIEW COMPARISON:  Chest x-rays dated 09/02/2018 and 09/01/2018 and chest CT dated 08/30/2018 FINDINGS: Right chest tubes are unchanged with no pneumothorax. Small amount of residual loculated fluid peripherally in the right  hemithorax, unchanged. Left lung is clear. Heart size and vascularity are normal. No acute bone abnormality. IMPRESSION: No significant change. Small amount of residual loculated pleural fluid peripherally in the right hemithorax as described. Electronically Signed   By: Lorriane Shire M.D.   On: 09/03/2018 08:26   Dg Chest Port 1 View  Result Date: 09/02/2018 CLINICAL DATA:  Status post VATS.  History of empyema EXAM: PORTABLE CHEST 1 VIEW COMPARISON:  09/01/2018 FINDINGS: Interval placement of 2 right-sided chest tubes with significant decrease in volume of large loculated right pleural effusion. No complications identified. No left pleural effusion. IMPRESSION: 1. Decrease in volume of loculated right pleural effusion. 2. Two right-sided chest tubes are in place without evidence for pneumothorax. Electronically Signed   By: Kerby Moors M.D.   On: 09/02/2018 14:05   Dg Chest Port 1 View  Result Date: 09/01/2018 CLINICAL DATA:  Recurrent right pleural effusion. Recently treated for pneumonia now with fever spikes. History of smoking, cardiomyopathy. EXAM: PORTABLE CHEST 1 VIEW COMPARISON:  Portable chest x-ray of August 28, 2018 FINDINGS: There has been interval placement of a pigtail catheter in the lower right pleural space with decreased volume of the right pleural effusion. Considerable fluid persists however especially in the lateral aspect of the hemithorax. Improved aeration of the right lung is seen. There are coarse lung markings at the right base. The left lung is  well-expanded. The interstitial markings are mildly prominent though stable. The heart is top-normal in size. There is no significant pulmonary vascular congestion. IMPRESSION: Slight interval decrease in the volume of the right pleural effusion. There may refer there remains considerable pleural fluid on the right which may be loculated. Improved aeration of the right lung. Probable right basilar pneumonia. Electronically Signed   By: David  [name] M.D.   On: 09/01/2018 10:26   Dg Chest Port 1 View  Result Date: 08/28/2018 CLINICAL DATA:  Post right-sided thoracentesis. EXAM: PORTABLE CHEST 1 VIEW COMPARISON:  Chest radiograph - earlier same day FINDINGS: Slight reduction in persistent moderate-to-large potentially partially loculated right-sided effusion post thoracentesis. No pneumothorax. Minimally improved aeration of the right lung with persistent right mid and lower lung consolidative opacities. Grossly unchanged cardiac silhouette and mediastinal contours. The left hemithorax remains well aerated. The left-sided pleural effusion. No evidence of edema. No acute osseus abnormalities. Stigmata of DISH within the thoracic spine. IMPRESSION: Slight reduction in persistent moderate to large sized right-sided effusion post large volume right-sided thoracentesis. No pneumothorax. Electronically Signed   By: Sandi Mariscal M.D.   On: 08/28/2018 19:29   Dg Chest Portable 1 View  Result Date: 08/16/2018 CLINICAL DATA:  Chest pain and short of breath EXAM: PORTABLE CHEST 1 VIEW COMPARISON:  06/03/2008 FINDINGS: Small right greater than left pleural effusions. Cardiomegaly with vascular congestion. Consolidation at the right middle lobe and right base. No pneumothorax. IMPRESSION: 1. Small right greater than left pleural effusion with consolidation at the right middle lobe and right base. 2. Cardiomegaly with vascular congestion Electronically Signed   By: Donavan Foil M.D.   On: 08/16/2018 22:41   Ct Image Guided  Drainage By Percutaneous Catheter  Result Date: 08/30/2018 CLINICAL DATA:  Right lung pneumonia with enlarging parapneumonic effusion despite recent thoracentesis. Request has been made to place a pigtail drainage catheter for evacuation. EXAM: CT GUIDED PLACEMENT OF RIGHT PLEURAL THORACOSTOMY TUBE ANESTHESIA/SEDATION: 2.0 mg IV Versed 100 mcg IV Fentanyl Total Moderate Sedation Time:  18 minutes The patient's level of consciousness and physiologic status  were continuously monitored during the procedure by Radiology nursing. PROCEDURE: The procedure, risks, benefits, and alternatives were explained to the patient. Questions regarding the procedure were encouraged and answered. The patient understands and consents to the procedure. A time out was performed prior to initiating the procedure. The right lateral chest wall was prepped with chlorhexidine in a sterile fashion, and a sterile drape was applied covering the operative field. A sterile gown and sterile gloves were used for the procedure. Local anesthesia was provided with 1% Lidocaine. CT was performed in a supine position with the right side rolled up slightly. After choosing a site for access, an 18 gauge trocar needle was advanced under CT guidance into the right lower lateral pleural space. After return of fluid, a guidewire was advanced into the pleural space. The tract was dilated and a 12 French percutaneous pigtail drainage catheter was advanced over the wire. Catheter positioning was confirmed by CT. The catheter was connected to a Pleur-evac device. It was secured at the skin with a Prolene retention suture, StatLock device and overlying dressing. COMPLICATIONS: None FINDINGS: CT demonstrates a large right pleural effusion with significant compression of the right lung. After thoracostomy tube placement, there is return of dark yellow, clear fluid. IMPRESSION: Placement 12 French thoracostomy tube into the right pleural space for evacuation of a  large parapneumonic effusion. The tube was attached to a Pleur-evac device which will be connected to wall suction at -20 cm of water. Electronically Signed   By: Aletta Edouard M.D.   On: 08/30/2018 16:42   US Thoracentesis Asp Pleural Space W/img Guide  Result Date: 08/28/2018 INDICATION: Symptomatic right-sided pleural effusion. Please from ultrasound-guided thoracentesis for diagnostic and therapeutic purposes. EXAM: US THORACENTESIS ASP PLEURAL SPACE W/IMG GUIDE COMPARISON:  Chest radiograph - 08/28/2018; 08/16/2018; chest CT - 08/16/2018 MEDICATIONS: None. COMPLICATIONS: None immediate. TECHNIQUE: Informed written consent was obtained from the patient after a discussion of the risks, benefits and alternatives to treatment. A timeout was performed prior to the initiation of the procedure. Initial ultrasound scanning demonstrates a large anechoic right-sided pleural effusion. The lower chest was prepped and draped in the usual sterile fashion. 1% lidocaine was used for local anesthesia. An ultrasound image was saved for documentation purposes. An 8 Fr Safe-T-Centesis catheter was introduced. The thoracentesis was performed. The catheter was removed and a dressing was applied. The patient tolerated the procedure well without immediate post procedural complication. The patient was escorted to have an upright chest radiograph. FINDINGS: A total of approximately 1.5 liters of serous fluid was removed. Requested samples were sent to the laboratory. IMPRESSION: Successful ultrasound-guided right sided thoracentesis yielding 1.5 liters of pleural fluid. Electronically Signed   By: Sandi Mariscal M.D.   On: 08/28/2018 19:29    Micro Results   Recent Results (from the past 240 hour(s))  Body fluid culture     Status: None   Collection Time: 08/28/18  7:34 PM  Result Value Ref Range Status   Specimen Description   Final    FLUID PLEURAL RIGHT Performed at [facility], [address]  [place], [place] [address]    Special Requests   Final    NONE Performed at [facility], [place] [place],  [address]    Gram Stain   Final    RARE WBC PRESENT, PREDOMINANTLY MONONUCLEAR NO ORGANISMS SEEN    Culture   Final    NO GROWTH 3 DAYS Performed at [facility], [place]  [address] [place], [place] [address]    Report Status 09/01/2018 FINAL  Final  Culture, blood (Routine X 2) w Reflex to ID Panel     Status: None   Collection Time: 09/01/18  9:50 PM  Result Value Ref Range Status   Specimen Description BLOOD LEFT ANTECUBITAL  Final   Special Requests   Final    BOTTLES DRAWN AEROBIC AND ANAEROBIC Blood Culture adequate volume   Culture   Final    NO GROWTH 5 DAYS Performed at [facility], [place] [address] [place], [place] [address]    Report Status 09/06/2018 FINAL  Final  Culture, blood (Routine X 2) w Reflex to ID Panel     Status: None   Collection Time: 09/01/18  9:52 PM  Result Value Ref Range Status   Specimen Description BLOOD RIGHT HAND  Final   Special Requests   Final    BOTTLES DRAWN AEROBIC ONLY Blood Culture adequate volume   Culture   Final    NO GROWTH 5 DAYS Performed at [facility], [place] [address], [place], [place] [address]    Report Status 09/06/2018 FINAL  Final  Surgical PCR screen     Status: None   Collection Time: 09/02/18  5:30 AM  Result Value Ref Range Status   MRSA, PCR NEGATIVE NEGATIVE Final   Staphylococcus aureus NEGATIVE NEGATIVE Final    Comment: (NOTE) The Xpert SA Assay (FDA approved for NASAL specimens in patients 80 years of age and older), is one component of a comprehensive surveillance program. It is not intended to diagnose infection nor to guide or monitor treatment. Performed at [facility], [place] [address], [place], [place] [address]   Body fluid culture     Status: None   Collection Time: 09/02/18 11:31 AM  Result Value Ref Range Status   Specimen Description FLUID  PLEURAL RIGHT  Final   Special Requests NONE  Final   Gram Stain   Final    CYTOSPIN SMEAR WBC PRESENT, PREDOMINANTLY PMN NO ORGANISMS SEEN    Culture   Final    NO GROWTH 3 DAYS Performed at [facility], [place] [address], [place], [place] [address]    Report Status 09/05/2018 FINAL  Final  Aerobic/Anaerobic Culture (surgical/deep wound)     Status: None   Collection Time: 09/02/18 11:31 AM  Result Value Ref Range Status   Specimen Description TISSUE PLEURAL RIGHT  Final   Special Requests SPECIMEN C  Final   Gram Stain   Final    MODERATE WBC PRESENT, PREDOMINANTLY PMN NO ORGANISMS SEEN RESULT CALLED TO, READ BACK BY AND VERIFIED WITH: ROSE RN AT 1300 ON [id number] BY SJW    Culture   Final    No growth aerobically or anaerobically. Performed at [facility], [place] [address] [place], [place] [address]    Report Status 09/07/2018 FINAL  Final  Acid Fast Smear (AFB)     Status: None   Collection Time: 09/02/18 11:31 AM  Result Value Ref Range Status   AFB Specimen Processing Comment  Final    Comment: Tissue Grinding and Digestion/Decontamination   Acid Fast Smear Negative  Final    Comment: (NOTE) Performed At: [facility] [place], [place] [id number] Rush Farmer MD [id number]    Source (AFB) TISSUE  Final    Comment: PLEURAL RIGHT Performed at [facility], [place] [address] [place], [place] [address]  Acid Fast Smear (AFB)     Status: None   Collection Time: 09/02/18 11:31 AM  Result Value Ref Range Status   AFB Specimen Processing Concentration  Final   Acid Fast Smear Negative  Final    Comment: (NOTE) Performed At: [facility] [place], [place] [id number] Rush Farmer MD [id number]    Source (AFB) FLUID  Final    Comment: PLEURAL RIGHT Performed at [facility], [place] [address] [place], [place] [address]   Fungus Culture With Stain     Status: None (Preliminary result)    Collection Time: 09/02/18 11:31 AM  Result Value Ref Range Status   Fungus Stain Final report  Final    Comment: (NOTE) Performed At: [facility] [place], [place] [id number] Rush Farmer MD [id number]    Fungus (Mycology) Culture PENDING  Incomplete   Fungal Source FLUID  Final    Comment: PLEURAL RIGHT Performed at [facility], [place] [address], [place], [place] [address]   Fungus Culture Result     Status: None   Collection Time: 09/02/18 11:31 AM  Result Value Ref Range Status   Result 1 Comment  Final    Comment: (NOTE) KOH/Calcofluor preparation:  no fungus observed. Performed At: [facility] [place], [place] [id number] Rush Farmer MD [id number]   Aerobic/Anaerobic Culture (surgical/deep wound)     Status: None   Collection Time: 09/02/18 11:32 AM  Result Value Ref Range Status   Specimen Description TISSUE PLEURAL RIGHT  Final   Special Requests SPECIMEN D  Final   Gram Stain   Final    MODERATE WBC PRESENT, PREDOMINANTLY PMN NO ORGANISMS SEEN RESULT CALLED TO, READ BACK BY AND VERIFIED WITH: ROSE RN AT 1300 ON [id number] BY SJW    Culture   Final    No growth aerobically or anaerobically. Performed at [facility], [place] [address], [place], [place] [address]    Report Status 09/07/2018 FINAL  Final       Today   Subjective    Roy [name] today has no new planes, no fevers no chills,, no productive cough, eager to go home          Patient has been seen and examined prior to discharge   Objective   Blood pressure 131/79, pulse (!) 54, temperature 98.1 F (36.7 C), temperature source Oral, resp. rate (!) 22, height 6\' 3"  (1.905 m), weight 108.4 kg, SpO2 98 %.   Intake/Output Summary (Last 24 hours) at 09/07/2018 1648 Last data filed at 09/07/2018 0648 Gross per 24 hour  Intake 740.46 ml  Output 2325 ml  Net -1584.54 ml    Exam Gen:- Awake Alert,  In no apparent distress  HEENT:-  .AT, No sclera icterus Neck-Supple Neck,No JVD,.  Lungs-  CTAB , right chest wall previous chest tube site clean dry and intact, and movement is fair symmetrical CV- S1, S2 normal, regular Abd-  +ve B.Sounds, Abd Soft, No tenderness,    Extremity/Skin:- No  edema,   good pulses Psych-affect is appropriate, oriented x3 Neuro-no new focal deficits, no tremors   Data Review   CBC w Diff:  Lab Results  Component Value Date   WBC 6.7 09/07/2018   HGB 10.0 (L) 09/07/2018   HCT 30.0 (L) 09/07/2018   PLT 571 (H) 09/07/2018   LYMPHOPCT 14 08/28/2018   MONOPCT 13 08/28/2018   EOSPCT 2 08/28/2018   BASOPCT 1 08/28/2018  CMP:  Lab Results  Component Value Date   NA 138 09/05/2018   K 3.9 09/05/2018   CL 100 09/05/2018   CO2 29 09/05/2018   BUN 8 09/05/2018   CREATININE 0.87 09/05/2018   PROT 5.4 (L) 09/04/2018   ALBUMIN 1.6 (L) 09/04/2018   BILITOT 0.3 09/04/2018   ALKPHOS 93 09/04/2018   AST 42 (H) 09/04/2018   ALT 99 (H) 09/04/2018  .   Total Discharge time is about 33 minutes  Roxan Hockey M.D on 09/07/2018 at 4:48 PM  Pager---[phone]  Go to [email or web address] - password [email or web address] for contact info  [facility] - Office  [phone]

## 2018-09-09 ENCOUNTER — Telehealth: Payer: Self-pay

## 2018-09-09 NOTE — Telephone Encounter (Signed)
Contacted the patient to make him aware that his pain medication could be the cause of his night sweats.  He stated that he did not take any pain medications yesterday evening.  But he felt that he could still have some in his system.  He acknowledged that it does not seem to be bothering him too much and he is feeling much better since being discharged from the hospital.  All questions answered and he thanked me for the return call.

## 2018-09-09 NOTE — Telephone Encounter (Signed)
-----   Message from Melrose Nakayama, MD sent at 09/09/2018  9:29 AM EDT ----- Probably his pain med  [facility] ----- Message ----- From: Donnella Sham, RN Sent: 09/09/2018   9:22 AM EDT To: Melrose Nakayama, MD  Patient called this morning stating he has been having night sweats, woke up in the middle of the night sweating profusely.  This also happened in the hospital which they told him it was due to his temperature.  Stated that he has been checking his temperature and it has been normal, no fever.  Also, stated he has checked his blood pressure which was normal.  Please advise.  Thanks,  Caryl Pina

## 2018-09-12 ENCOUNTER — Telehealth: Payer: Self-pay

## 2018-09-12 NOTE — Telephone Encounter (Signed)
Mr. Martin[place] called again today concerned about his night sweats.  He did state that it was not as bad as it has been in recent nights.  I advised him that he seems to overall be doing well in recovery and the night sweats should dissipate.  He acknowledged receipt.  He stated that he was losing some weight but still had an appetite.  I reassured him that some weight loss after surgery is to be expected as long as he still has an appetite.  He said that his temperature is lower than normal, however he has not been drinking/ taking shots like he had been so that may be the cause for him feeling cooler than normal.  I did stated that 97.9 which he stated was his last temperature is considered normal and he should not be concerned.  He acknowledged receipt and thanked me for the return call.

## 2018-09-27 ENCOUNTER — Ambulatory Visit
Admission: RE | Admit: 2018-09-27 | Discharge: 2018-09-27 | Disposition: A | Discharge status: Home / Self Care | Payer: Self-pay | Source: Ambulatory Visit | Attending: Thoracic Surgery (Cardiothoracic Vascular Surgery) | Admitting: Thoracic Surgery (Cardiothoracic Vascular Surgery)

## 2018-09-27 ENCOUNTER — Ambulatory Visit (INDEPENDENT_AMBULATORY_CARE_PROVIDER_SITE_OTHER): Payer: Self-pay | Admitting: Thoracic Surgery (Cardiothoracic Vascular Surgery)

## 2018-09-27 ENCOUNTER — Other Ambulatory Visit: Payer: Self-pay | Admitting: Thoracic Surgery (Cardiothoracic Vascular Surgery)

## 2018-09-27 VITALS — BP 140/82 | HR 59 | Resp 20 | Ht 75.0 in | Wt 247.0 lb

## 2018-09-27 DIAGNOSIS — J869 Pyothorax without fistula: Secondary | ICD-10-CM

## 2018-09-27 DIAGNOSIS — Z09 Encounter for follow-up examination after completed treatment for conditions other than malignant neoplasm: Secondary | ICD-10-CM

## 2018-09-27 MED ORDER — GABAPENTIN 300 MG PO CAPS: 300.0000 mg | ORAL_CAPSULE | Freq: Three times a day (TID) | ORAL | 3 refills | Status: DC

## 2018-09-27 MED ORDER — DEXTROMETHORPHAN HBR 15 MG/5ML PO SYRP: 10.0000 mL | ORAL_SOLUTION | Freq: Four times a day (QID) | ORAL | 0 refills | Status: DC | PRN

## 2018-09-27 MED ORDER — OXYCODONE HCL 5 MG PO TABS: ORAL_TABLET | ORAL | 0 refills | Status: DC

## 2018-09-27 NOTE — Progress Notes (Signed)
[address]       [place] [address]             [phone]    HPI: Roy [name] returns for a scheduled postoperative follow-up visit  Roy [name] is a 47 year old man history of tobacco and marijuana use, vaping, hypertension, and coiling of a cerebral aneurysm.  He presented in September with pneumonia.  He developed a large right parapneumonic effusion.  Thoracentesis showed an exudate.  The pigtail catheter was placed but his loculated effusion was not drained by that.  He had persistent fever, loculated fluid, and pain.  He was transferred to [facility].  I did a right VATS with decortication and drainage of the pleural effusion on 09/02/2018.  He did well postoperatively and went home on postoperative day #5.  He did continue antibiotics for 5 days as an outpatient with Omnicef and azithromycin.  He complains of pain.  He says this feels like it is under the pec muscle.  He says its generally about 5-6 out of 10.  Oxycodone helps.  Tylenol and ibuprofen did not help.  He is taking one oxycodone tablet every 6 hours.  He says he has a frequent cough.  He has not had any fevers or chills but has noted some sweats at night.  Past Medical History:  Diagnosis Date  . Aneurysm ([place])   . Cardiomyopathy ([place])    pt sts he no longer has it  . Hypertension   . Renal calculi   . Tobacco abuse     Current Outpatient Medications  Medication Sig Dispense Refill  . acetaminophen (TYLENOL) 500 MG tablet Take 2 tablets (1,000 mg total) by mouth every 6 (six) hours as needed for mild pain or fever. 30 tablet 0  . albuterol (PROVENTIL) (2.5 MG/3ML) 0.083% nebulizer solution Take 3 mLs (2.5 mg total) by nebulization every 4 (four) hours as needed for wheezing or shortness of breath. 75 mL 12  . amLODipine (NORVASC) 5 MG tablet Take 1 tablet (5 mg total) by mouth daily. For Blood Pressure 30 tablet 2  . guaiFENesin (MUCINEX) 600 MG 12 hr tablet Take 1 tablet (600 mg total) by  mouth 2 (two) times daily. 20 tablet 1  . hydrOXYzine (ATARAX/VISTARIL) 25 MG tablet Take 1 tablet (25 mg total) by mouth every 6 (six) hours as needed for anxiety. 15 tablet 0  . lactobacillus acidophilus & bulgar (LACTINEX) chewable tablet Chew 1 tablet by mouth 3 (three) times daily with meals. 30 tablet 0  . oxyCODONE (OXY IR/ROXICODONE) 5 MG immediate release tablet Take 5 mg by mouth every 4-6 hours PRN severe pain 28 tablet 0  . traZODone (DESYREL) 100 MG tablet Take 1 tablet (100 mg total) by mouth at bedtime as needed for sleep. As needed For sleep and anxiety 30 tablet 2  . dextromethorphan 15 MG/5ML syrup Take 10 mLs (30 mg total) by mouth 4 (four) times daily as needed for cough. 120 mL 0  . gabapentin (NEURONTIN) 300 MG capsule Take 1 capsule (300 mg total) by mouth 3 (three) times daily. 90 capsule 3   No current facility-administered medications for this visit.     Physical Exam BP 140/82   Pulse (!) 59   Resp 20   Ht 6\' 3"  (1.905 m)   Wt 247 lb (112 kg)   SpO2 98%   BMI 30.52 kg/m  47 year old man in no acute distress Alert and oriented x3  with no focal deficits Lungs slightly diminished at right base, otherwise clear Incisions healing well  Diagnostic Tests: CHEST - 2 VIEW  COMPARISON:  Radiographs of September 07, 2018.  FINDINGS: The heart size and mediastinal contours are within normal limits. No pneumothorax is noted. Left lung is clear. Stable right basilar scarring or atelectasis is noted with small associated pleural effusion. The visualized skeletal structures are unremarkable.  IMPRESSION: Stable right basilar scarring or atelectasis is noted with small associated right pleural effusion.   Electronically Signed   By: Marijo Conception, M.D.   On: 09/27/2018 15:53 I personally reviewed the chest x-ray images and concur with the findings noted above.  Impression: Roy [name] is a 47 year old man who had a pneumonia complicated by parapneumonic  effusion. This was not able to be drained with a pigtail catheter.  He underwent right VATS and drainage of the effusion and decortication of the lung and chest wall on 09/02/2018.  He is now about 3 weeks out from surgery.  He has an excellent radiographic result post decortication.  Postoperative pain-he still having a significant amount of pain I encouraged him to take either Tylenol or ibuprofen as a baseline and then use oxycodone as needed on top of that.  It is not unusual to still require some narcotics this early on after surgery.  I did refill his prescription for the oxycodone as he is almost out of pills.  I am also going to put him on some low-dose gabapentin to see if that helps we will start with 300 mg p.o. 3 times daily.  It will be on board in case we need to escalate it later.  Pneumonia-has completed his antibiotic course.  Not having any fevers or chills.  He does have a persistent cough but it is nonproductive.  I do not see any indication for restarting antibiotics at this time.  Plan:  Gabapentin 300 mg p.o. 3 times daily Refill oxycodone 5 mg tablets 1 p.o. every 6 hours as needed, 28 tablets, no refills Return in 3 weeks with PA and lateral chest x-ray  Melrose Nakayama, MD [facility] [phone]

## 2018-09-29 ENCOUNTER — Telehealth: Payer: Self-pay

## 2018-09-29 NOTE — Telephone Encounter (Signed)
-----   Message from Melrose Nakayama, MD sent at 09/28/2018  3:24 PM EDT ----- Regarding: RE: cost of Gabapentin to [place]. anything else in that class is going to be more expensive No tramadol already has oxycodone. Can switch to tramadol with next refill  [facility] ----- Message ----- From: Marylen Ponto, LPN Sent: 52/48/1859  11:48 AM EDT To: Melrose Nakayama, MD Subject: cost of Gabapentin to [place]IGH                     Roy [name] states that he is unable to afford the Gabapentin. Requesting something different? He asked for Tramadol... I told him is was not the same type of medication Please advise SW

## 2018-10-03 LAB — FUNGAL ORGANISM REFLEX

## 2018-10-03 LAB — FUNGUS CULTURE WITH STAIN

## 2018-10-03 LAB — FUNGUS CULTURE RESULT

## 2018-10-16 LAB — ACID FAST CULTURE WITH REFLEXED SENSITIVITIES (MYCOBACTERIA)

## 2018-10-16 LAB — ACID FAST CULTURE WITH REFLEXED SENSITIVITIES
ACID FAST CULTURE - AFSCU3: NEGATIVE
ACID FAST CULTURE - AFSCU3: NEGATIVE

## 2018-10-19 ENCOUNTER — Telehealth: Payer: Self-pay

## 2018-10-19 ENCOUNTER — Other Ambulatory Visit: Payer: Self-pay

## 2018-10-19 MED ORDER — TRAMADOL HCL 50 MG PO TABS: 50.0000 mg | ORAL_TABLET | Freq: Four times a day (QID) | ORAL | 0 refills | Status: DC | PRN

## 2018-10-19 NOTE — Telephone Encounter (Signed)
[name] contacted the office requesting a refill of pain medications.  He is s/p VATS 09/02/18 with Dr. Roxan Hockey.  Patient given 7 day supply of Tramadol per Dr. Leonarda Salon orders.  Patient called and made aware, sent to requested pharmacy.

## 2018-10-24 ENCOUNTER — Other Ambulatory Visit: Payer: Self-pay | Admitting: Thoracic Surgery (Cardiothoracic Vascular Surgery)

## 2018-10-24 DIAGNOSIS — J869 Pyothorax without fistula: Secondary | ICD-10-CM

## 2018-10-25 ENCOUNTER — Other Ambulatory Visit: Payer: Self-pay

## 2018-10-25 ENCOUNTER — Other Ambulatory Visit: Payer: Self-pay | Admitting: *Deleted

## 2018-10-25 ENCOUNTER — Ambulatory Visit (INDEPENDENT_AMBULATORY_CARE_PROVIDER_SITE_OTHER): Payer: Self-pay | Admitting: Thoracic Surgery (Cardiothoracic Vascular Surgery)

## 2018-10-25 ENCOUNTER — Ambulatory Visit
Admission: RE | Admit: 2018-10-25 | Discharge: 2018-10-25 | Disposition: A | Discharge status: Home / Self Care | Payer: Self-pay | Source: Ambulatory Visit | Attending: Thoracic Surgery (Cardiothoracic Vascular Surgery) | Admitting: Thoracic Surgery (Cardiothoracic Vascular Surgery)

## 2018-10-25 ENCOUNTER — Encounter: Payer: Self-pay | Admitting: Thoracic Surgery (Cardiothoracic Vascular Surgery)

## 2018-10-25 VITALS — BP 142/80 | HR 56 | Resp 18 | Ht 75.0 in | Wt 256.8 lb

## 2018-10-25 DIAGNOSIS — R911 Solitary pulmonary nodule: Secondary | ICD-10-CM

## 2018-10-25 DIAGNOSIS — R918 Other nonspecific abnormal finding of lung field: Secondary | ICD-10-CM

## 2018-10-25 DIAGNOSIS — J869 Pyothorax without fistula: Secondary | ICD-10-CM

## 2018-10-25 DIAGNOSIS — J90 Pleural effusion, not elsewhere classified: Secondary | ICD-10-CM

## 2018-10-25 NOTE — Progress Notes (Signed)
Roy [name]       [place] [address]             [phone]     HPI: [name] returns for scheduled follow-up visit  [name] is a 47 year old man with a history of tobacco and marijuana use, vaping, hypertension, and coiling of a cerebral aneurysm.  He presented back in September with pneumonia.  He developed a large right parapneumonic effusion.  He underwent right VATS with decortication on 09/02/2018.  He went home on postoperative day #5.  He was having a lot of pain initially.  He was using oxycodone for that.  More recently he has been using tramadol and that seemed to be a little more effective.  At his last visit I also started him on gabapentin.  He feels better today.  He does still have some pain in the area particularly when he coughs.  He is very concerned about having a cough.  He does not want to go through the same process again.  He has not had any fevers or chills.  His cough has been nonproductive.  Past Medical History:  Diagnosis Date  . Aneurysm ([place])   . Cardiomyopathy ([place])    pt sts he no longer has it  . 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 Hypertension   . Renal calculi   . Tobacco abuse     Current Outpatient Medications  Medication Sig Dispense Refill  . acetaminophen (TYLENOL) 500 MG tablet Take 2 tablets (1,000 mg total) by mouth every 6 (six) hours as needed for mild pain or fever. 30 tablet 0  . albuterol (PROVENTIL) (2.5 MG/3ML) 0.083% nebulizer solution Take 3 mLs (2.5 mg total) by nebulization every 4 (four) hours as needed for wheezing or shortness of breath. 75 mL 12  . amLODipine (NORVASC) 5 MG tablet Take 1 tablet (5 mg total) by mouth daily. For Blood Pressure 30 tablet 2  . dextromethorphan 15 MG/5ML syrup Take 10 mLs (30 mg total) by mouth 4 (four) times daily as needed for cough. 120 mL 0  . gabapentin (NEURONTIN) 300 MG capsule Take 1 capsule (300 mg total) by mouth 3 (three) times daily. 90 capsule 3    . guaiFENesin (MUCINEX) 600 MG 12 hr tablet Take 1 tablet (600 mg total) by mouth 2 (two) times daily. 20 tablet 1  . hydrOXYzine (ATARAX/VISTARIL) 25 MG tablet Take 1 tablet (25 mg total) by mouth every 6 (six) hours as needed for anxiety. 15 tablet 0  . lactobacillus acidophilus & bulgar (LACTINEX) chewable tablet Chew 1 tablet by mouth 3 (three) times daily with meals. 30 tablet 0  . oxyCODONE (OXY IR/ROXICODONE) 5 MG immediate release tablet Take 5 mg by mouth every 4-6 hours PRN severe pain 28 tablet 0  . traMADol (ULTRAM) 50 MG tablet Take 1 tablet (50 mg total) by mouth every 6 (six) hours as needed. 28 tablet 0  . traZODone (DESYREL) 100 MG tablet Take 1 tablet (100 mg total) by mouth at bedtime as needed for sleep. As needed For sleep and anxiety 30 tablet 2   No current facility-administered medications for this visit.     Physical Exam BP (!) 142/80 (BP Location: Right Arm, Patient Position: Sitting, Cuff Size: Normal)   Pulse (!) 56   Resp 18   Ht 6\' 3"  (1.905 m)   Wt 256 lb 12.8 oz (116.5 kg)   SpO2 97% Comment: RA  BMI  32.55 kg/m  47 year old man in no acute distress Alert and oriented x3 with no focal deficits Lungs clear with equal breath sounds bilaterally Incisions well-healed  Diagnostic Tests: CHEST - 2 VIEW  COMPARISON:  09/27/2018  FINDINGS: Normal heart size, mediastinal contours, and pulmonary vascularity.  Chronic pleural thickening/effusion in RIGHT hemithorax laterally and at base, unchanged.  Lungs otherwise clear.  No LEFT pleural effusion.  No segmental consolidation or pneumothorax.  Multilevel endplate spur formation thoracic spine.  IMPRESSION: Persistent pleural thickening versus fluid in the RIGHT hemithorax laterally and at base.  No new abnormalities.   Electronically Signed   By: Lavonia Dana M.D.   On: 10/25/2018 15:29 I personally reviewed the chest x-ray images and concur with the findings noted  above  Impression: [name] is a 46 year old gentleman who had pneumonia back in September.  That was complicated by a large right parapneumonic effusion.  He underwent right VATS to drain the effusion and decorticate the lung on 09/02/2018.  Overall he is done extremely well postoperatively.  He did have some issues with pain.  That is improving dramatically.  He is currently using tramadol occasionally, but is not needing it every day.  He is on low-dose gabapentin as well.  His CT prior to surgery showed multiple left lung nodules.  Some of those had actually gotten smaller in the interval between his 2 CT scans.  However he does need a follow-up CT to evaluate those areas.  There are no restrictions on his activities.  Plan: Return in 1 month with CT chest to follow-up left lung nodules.  Melrose Nakayama, MD [facility] [phone]

## 2018-10-27 ENCOUNTER — Telehealth: Payer: Self-pay

## 2018-10-27 NOTE — Telephone Encounter (Signed)
Roy [name] contacted the office asking if he could fly.  He is planning on going to [place] next week.  He was advised at his last follow-up appointment that he did not have any restrictions.  Patient was advised that he could fly.  Patient acknowledged receipt.

## 2018-11-28 ENCOUNTER — Ambulatory Visit: Payer: Self-pay | Admitting: Thoracic Surgery (Cardiothoracic Vascular Surgery)

## 2018-11-28 ENCOUNTER — Other Ambulatory Visit: Payer: Self-pay

## 2018-11-30 ENCOUNTER — Encounter: Payer: Self-pay | Admitting: Thoracic Surgery (Cardiothoracic Vascular Surgery)

## 2018-11-30 ENCOUNTER — Ambulatory Visit
Admission: RE | Admit: 2018-11-30 | Discharge: 2018-11-30 | Disposition: A | Discharge status: Home / Self Care | Payer: Self-pay | Source: Ambulatory Visit | Attending: Thoracic Surgery (Cardiothoracic Vascular Surgery) | Admitting: Thoracic Surgery (Cardiothoracic Vascular Surgery)

## 2018-11-30 ENCOUNTER — Ambulatory Visit (INDEPENDENT_AMBULATORY_CARE_PROVIDER_SITE_OTHER): Payer: Self-pay | Admitting: Thoracic Surgery (Cardiothoracic Vascular Surgery)

## 2018-11-30 VITALS — BP 138/80 | HR 72 | Resp 20 | Ht 75.0 in | Wt 264.0 lb

## 2018-11-30 DIAGNOSIS — R918 Other nonspecific abnormal finding of lung field: Secondary | ICD-10-CM

## 2018-11-30 NOTE — Progress Notes (Signed)
[address]       [place] [address]             [phone]     HPI: Roy [name] returns for a scheduled follow-up visit  Roy [name] is a 47 year old man with a history of tobacco abuse, vaping, hypertension, and coiling of a cerebral aneurysm, who presented with pneumonia back in September.  He developed a large right parapneumonic effusion.  I did a right VATS decortication on 09/02/2018.  He went home on day 5.  Saw him back in October he was having a lot of pain at that time.  By the time I saw him back in November the pain was improved.  On his original x-ray he had some nodules in the left upper and left lower lobes.  These were probably infectious/inflammatory, but I wanted to get a repeat CT to follow-up on those nodules.  He returns for that today.  He continues to notice some discomfort when he takes deep breath and he still has some numbness around the right nipple area.  He is not using any narcotics.  Past Medical History:  Diagnosis Date  . Aneurysm ([place])   . Cardiomyopathy ([place])    pt sts he no longer has it  . Empyema of pleural space ([place]) 08/2018  . Hypertension   . Renal calculi   . Tobacco abuse      Current Outpatient Medications  Medication Sig Dispense Refill  . acetaminophen (TYLENOL) 500 MG tablet Take 2 tablets (1,000 mg total) by mouth every 6 (six) hours as needed for mild pain or fever. 30 tablet 0  . albuterol (PROVENTIL) (2.5 MG/3ML) 0.083% nebulizer solution Take 3 mLs (2.5 mg total) by nebulization every 4 (four) hours as needed for wheezing or shortness of breath. 75 mL 12  . amLODipine (NORVASC) 5 MG tablet Take 1 tablet (5 mg total) by mouth daily. For Blood Pressure 30 tablet 2  . dextromethorphan 15 MG/5ML syrup Take 10 mLs (30 mg total) by mouth 4 (four) times daily as needed for cough. 120 mL 0  . gabapentin (NEURONTIN) 300 MG capsule Take 1 capsule (300 mg total) by mouth 3 (three) times daily. 90 capsule 3  .  guaiFENesin (MUCINEX) 600 MG 12 hr tablet Take 1 tablet (600 mg total) by mouth 2 (two) times daily. 20 tablet 1  . hydrOXYzine (ATARAX/VISTARIL) 25 MG tablet Take 1 tablet (25 mg total) by mouth every 6 (six) hours as needed for anxiety. 15 tablet 0  . lactobacillus acidophilus & bulgar (LACTINEX) chewable tablet Chew 1 tablet by mouth 3 (three) times daily with meals. 30 tablet 0  . oxyCODONE (OXY IR/ROXICODONE) 5 MG immediate release tablet Take 5 mg by mouth every 4-6 hours PRN severe pain 28 tablet 0  . traMADol (ULTRAM) 50 MG tablet Take 1 tablet (50 mg total) by mouth every 6 (six) hours as needed. 28 tablet 0  . traZODone (DESYREL) 100 MG tablet Take 1 tablet (100 mg total) by mouth at bedtime as needed for sleep. As needed For sleep and anxiety 30 tablet 2   No current facility-administered medications for this visit.     Physical Exam BP 138/80   Pulse 72   Resp 20   Ht 6\' 3"  (1.905 m)   Wt 264 lb (119.7 kg)   SpO2 98% Comment: RA  BMI 33.52 kg/m  47 year old man in no acute distress Alert and oriented x3 with no  focal deficits Lungs clear with good breath sounds bilaterally Incisions well-healed  Diagnostic Tests: CT CHEST WITHOUT CONTRAST  TECHNIQUE: Multidetector CT imaging of the chest was performed following the standard protocol without IV contrast.  COMPARISON:  08/30/2018 and 08/16/2018  FINDINGS: Cardiovascular:  No acute findings.  Mediastinum/Nodes: No masses or pathologically enlarged lymph nodes identified on this unenhanced exam.  Lungs/Pleura: There has been resolution of large right pleural effusion and compressive atelectasis since previous study. There has been interval resolution of left upper and lower lobe pulmonary nodular opacities since previous study, consistent with resolving infectious or inflammatory process. A 5 mm noncalcified pulmonary nodule is seen in the posterior right lower lobe on image 121/3, and this area was obscured  by atelectasis on prior exams.  Upper Abdomen:  Unremarkable.  Musculoskeletal:  No suspicious bone lesions.  IMPRESSION: Interval resolution of left upper and lower lobe pulmonary nodular opacities, consistent with resolving infectious or inflammatory process.  Resolution of right pleural effusion and compressive atelectasis since prior exam.  5 mm indeterminate right lower lobe pulmonary nodule, in area obscured by atelectasis on prior exams. No follow-up needed if patient is low-risk. Non-contrast chest CT can be considered in 12 months if patient is high-risk. This recommendation follows the consensus statement: Guidelines for Management of Incidental Pulmonary Nodules Detected on CT Images: From the Fleischner Society 2017; Radiology 2017; 284:228-243.   Electronically Signed   By: Earle Gell M.D.   On: 11/30/2018 14:04 I personally reviewed the CT images and concur with the findings noted above  Impression: Roy [name] is a 47 year old gentleman with history of tobacco abuse, marijuana use, vaping, hypertension, and a cerebral aneurysm (coiled).  He had pneumonia with a parapneumonic effusion back in September.  He had a right VATS for decortication on 09/02/2018.  He currently is doing well from a surgical perspective.  He still has some discomfort which is not unusual.  Hopefully that will all eventually completely resolve, but he may always have some unusual sensations there.  Tobacco abuse-he had quit for a while but has restarted.  I emphasized the importance of tobacco cessation.  He understands the health issues involved.  Left lung nodules-resolved  Right lower lobe nodule-5 mm nodule in the right lower lobe.  Likely parenchymal lymph node.  Given his smoking history I do think we should repeat a CT in a year.  Plan: Return in 1 year with CT chest  Melrose Nakayama, MD [facility] [phone]

## 2020-02-07 ENCOUNTER — Other Ambulatory Visit: Payer: Self-pay | Admitting: Thoracic Surgery (Cardiothoracic Vascular Surgery)

## 2020-02-07 DIAGNOSIS — R911 Solitary pulmonary nodule: Secondary | ICD-10-CM

## 2020-02-27 ENCOUNTER — Ambulatory Visit
Admission: RE | Admit: 2020-02-27 | Discharge: 2020-02-27 | Disposition: A | Discharge status: Home / Self Care | Payer: Medicaid Other | Source: Ambulatory Visit | Attending: Thoracic Surgery (Cardiothoracic Vascular Surgery) | Admitting: Thoracic Surgery (Cardiothoracic Vascular Surgery)

## 2020-02-27 DIAGNOSIS — R911 Solitary pulmonary nodule: Secondary | ICD-10-CM

## 2020-03-05 ENCOUNTER — Encounter: Payer: Self-pay | Admitting: Thoracic Surgery (Cardiothoracic Vascular Surgery)

## 2020-03-05 ENCOUNTER — Ambulatory Visit (INDEPENDENT_AMBULATORY_CARE_PROVIDER_SITE_OTHER): Payer: Self-pay | Admitting: Thoracic Surgery (Cardiothoracic Vascular Surgery)

## 2020-03-05 ENCOUNTER — Other Ambulatory Visit: Payer: Self-pay

## 2020-03-05 VITALS — BP 160/100 | HR 68 | Temp 98.7°F | Resp 20 | Ht 75.0 in | Wt 278.0 lb

## 2020-03-05 DIAGNOSIS — R911 Solitary pulmonary nodule: Secondary | ICD-10-CM

## 2020-03-05 MED ORDER — AMLODIPINE BESYLATE 5 MG PO TABS: 5.0000 mg | ORAL_TABLET | Freq: Every day | ORAL | 2 refills | Status: DC

## 2020-03-05 MED ORDER — ALBUTEROL SULFATE (2.5 MG/3ML) 0.083% IN NEBU: 2.5000 mg | INHALATION_SOLUTION | RESPIRATORY_TRACT | 12 refills | Status: DC | PRN

## 2020-03-05 NOTE — Progress Notes (Signed)
[address]       [place] [address]             [phone]       HPI: [name] returns for a scheduled follow-up visit  Roy [name] Roy [name] is a 49 year old man with a history of tobacco abuse, hypertension, coiling of a cerebral aneurysm, and pneumonia complicated by a parapneumonic effusion.  I did a right VATS for decortication on 09/02/2018.  I saw him in December 2019.  We did a CT at that time to follow-up on some left upper and lower lobe nodules.  Those nodules had resolved but there was a 5 mm right lower lobe nodule near the diaphragm that was noted on the CT scan.  He now returns for follow-up of that.  He continues to smoke.  He has been through a great deal of stress recently with 5 people close to him that have passed away from Covid in the past several months.  He has had the first dose of the Moderna vaccine.  He has been without a primary physician for about a year and is run out of all his medications.  Past Medical History:  Diagnosis Date  . Aneurysm ([place])   . Cardiomyopathy ([place])    pt sts he no longer has it  . Empyema of pleural space ([place]) 08/2018  . Hypertension   . Renal calculi   . Tobacco abuse     Current Outpatient Medications  Medication Sig Dispense Refill  . acetaminophen (TYLENOL) 500 MG tablet Take 2 tablets (1,000 mg total) by mouth every 6 (six) hours as needed for mild pain or fever. 30 tablet 0  . albuterol (PROVENTIL) (2.5 MG/3ML) 0.083% nebulizer solution Take 3 mLs (2.5 mg total) by nebulization every 4 (four) hours as needed for wheezing or shortness of breath. 75 mL 12  . amLODipine (NORVASC) 5 MG tablet Take 1 tablet (5 mg total) by mouth daily. For Blood Pressure 30 tablet 2  . dextromethorphan 15 MG/5ML syrup Take 10 mLs (30 mg total) by mouth 4 (four) times daily as needed for cough. 120 mL 0  . gabapentin (NEURONTIN) 300 MG capsule Take 1 capsule (300 mg total) by mouth 3 (three) times daily. 90 capsule 3  .  guaiFENesin (MUCINEX) 600 MG 12 hr tablet Take 1 tablet (600 mg total) by mouth 2 (two) times daily. 20 tablet 1  . hydrOXYzine (ATARAX/VISTARIL) 25 MG tablet Take 1 tablet (25 mg total) by mouth every 6 (six) hours as needed for anxiety. 15 tablet 0  . lactobacillus acidophilus & bulgar (LACTINEX) chewable tablet Chew 1 tablet by mouth 3 (three) times daily with meals. 30 tablet 0  . oxyCODONE (OXY IR/ROXICODONE) 5 MG immediate release tablet Take 5 mg by mouth every 4-6 hours PRN severe pain 28 tablet 0  . traMADol (ULTRAM) 50 MG tablet Take 1 tablet (50 mg total) by mouth every 6 (six) hours as needed. 28 tablet 0  . traZODone (DESYREL) 100 MG tablet Take 1 tablet (100 mg total) by mouth at bedtime as needed for sleep. As needed For sleep and anxiety 30 tablet 2   No current facility-administered medications for this visit.    Physical Exam BP (!) 160/100   Pulse 68   Temp 98.7 F (37.1 C) (Skin)   Resp 20   Ht 6\' 3"  (1.905 m)   Wt 278 lb (126.1 kg)   SpO2 97%   BMI 34.75 kg/m  49 year old man in no acute distress Alert and oriented x3 with no focal deficits Lungs clear with equal breath sounds bilaterally Cardiac regular rate and rhythm, normal S1 and S2  Diagnostic Tests: CT CHEST WITHOUT CONTRAST  TECHNIQUE: Multidetector CT imaging of the chest was performed following the standard protocol without IV contrast.  COMPARISON:  11/30/2018  FINDINGS: Cardiovascular: Heart size mildly enlarged with no signs of pericardial effusion. Calcified coronary artery disease is minimal. Vascular structures in the chest not well assessed due to lack of intravenous contrast. Central pulmonary vasculature is unremarkable.  Mediastinum/Nodes: Thoracic inlet structures are normal. No axillary adenopathy. No hilar adenopathy or mediastinal adenopathy.  Lungs/Pleura: Signs of pleural and parenchymal scarring evident in the right chest. Subtle paraseptal emphysema at the right lung  apex. Pleural and parenchymal scarring most notably at the periphery of the right mid and lower chest.  The juxta of diaphragmatic pulmonary nodule in the right chest has resolved. No signs of consolidation or evidence of pleural effusion. Airways are patent.  Upper Abdomen: Incidental imaging of upper abdominal contents without acute process.  Musculoskeletal: No acute bone finding or destructive bone lesion. Spinal degenerative changes.  IMPRESSION: 1. Signs of pleural and parenchymal scarring in the right chest. 2. The juxta of diaphragmatic pulmonary nodule in the right chest has resolved. 3. No acute cardiopulmonary process demonstrated. 4. Mild cardiomegaly with minimal calcified coronary artery disease.   Electronically Signed   By: Zetta Bills M.D.   On: 02/27/2020 14:26 I personally reviewed the CT images and concur with the findings noted above  Impression: Roy [name] is a 49 year old man with a history of tobacco abuse, hypertension, coiling of cerebral aneurysm, and pneumonia complicated by parapneumonic effusion.  Right lower lobe lung nodule-had a 5 mm nodule in the lower aspect of the right lower lobe on his scan a year ago.  There is no suspicious lesions in that area on today's scan.  No further follow-up needed for that specific nodule.  Tobacco abuse-emphasized the importance of tobacco cessation for his long-term health.  He has a greater than 20-pack-year history of smoking and is 49 years old so he meets the new criteria for lung cancer screening.  I recommended that and he is agreeable to do so.  We will plan a low-dose CT in a year.  Hypertension-blood pressure elevated.  He has run out of his medications.  He was on Norvasc.  I am going to put in a new prescription for that until he can find a new primary care physician.  Plan: New prescriptions sent for amlodipine 5 mg daily and albuterol 2.5 mg every 4 hours as needed Return in 1 year for  low-dose CT scan for lung cancer screening.  Melrose Nakayama, MD [facility] [phone]

## 2020-03-06 ENCOUNTER — Other Ambulatory Visit: Payer: Self-pay | Admitting: *Deleted

## 2020-03-06 MED ORDER — ALBUTEROL SULFATE (2.5 MG/3ML) 0.083% IN NEBU: 2.5000 mg | INHALATION_SOLUTION | RESPIRATORY_TRACT | 12 refills | Status: DC | PRN

## 2020-03-06 MED ORDER — AMLODIPINE BESYLATE 5 MG PO TABS: 5.0000 mg | ORAL_TABLET | Freq: Every day | ORAL | 2 refills | Status: DC

## 2020-03-07 ENCOUNTER — Telehealth: Payer: Self-pay

## 2020-03-07 ENCOUNTER — Other Ambulatory Visit: Payer: Self-pay | Admitting: Thoracic Surgery (Cardiothoracic Vascular Surgery)

## 2020-03-07 MED ORDER — ALBUTEROL SULFATE HFA 108 (90 BASE) MCG/ACT IN AERS: 2.0000 | INHALATION_SPRAY | Freq: Four times a day (QID) | RESPIRATORY_TRACT | 2 refills | Status: DC | PRN

## 2021-01-24 ENCOUNTER — Other Ambulatory Visit: Payer: Self-pay | Admitting: Thoracic Surgery (Cardiothoracic Vascular Surgery)

## 2021-01-24 DIAGNOSIS — Z720 Tobacco use: Secondary | ICD-10-CM

## 2021-02-27 ENCOUNTER — Inpatient Hospital Stay: Admission: RE | Admit: 2021-02-27 | Payer: Medicaid Other | Source: Ambulatory Visit

## 2021-03-04 ENCOUNTER — Ambulatory Visit: Payer: Medicaid Other | Admitting: Thoracic Surgery (Cardiothoracic Vascular Surgery)

## 2021-03-18 ENCOUNTER — Ambulatory Visit (INDEPENDENT_AMBULATORY_CARE_PROVIDER_SITE_OTHER): Payer: Self-pay | Admitting: Thoracic Surgery (Cardiothoracic Vascular Surgery)

## 2021-03-18 ENCOUNTER — Ambulatory Visit: Payer: Medicaid Other | Admitting: Thoracic Surgery (Cardiothoracic Vascular Surgery)

## 2021-03-18 ENCOUNTER — Other Ambulatory Visit: Payer: Self-pay

## 2021-03-18 ENCOUNTER — Ambulatory Visit
Admission: RE | Admit: 2021-03-18 | Discharge: 2021-03-18 | Disposition: A | Discharge status: Home / Self Care | Payer: Self-pay | Source: Ambulatory Visit | Attending: Thoracic Surgery (Cardiothoracic Vascular Surgery) | Admitting: Thoracic Surgery (Cardiothoracic Vascular Surgery)

## 2021-03-18 ENCOUNTER — Encounter: Payer: Self-pay | Admitting: Thoracic Surgery (Cardiothoracic Vascular Surgery)

## 2021-03-18 VITALS — BP 186/121 | HR 72 | Resp 20 | Wt 276.0 lb

## 2021-03-18 DIAGNOSIS — R911 Solitary pulmonary nodule: Secondary | ICD-10-CM

## 2021-03-18 DIAGNOSIS — Z720 Tobacco use: Secondary | ICD-10-CM

## 2021-03-18 MED ORDER — ALBUTEROL SULFATE HFA 108 (90 BASE) MCG/ACT IN AERS: 2.0000 | INHALATION_SPRAY | Freq: Four times a day (QID) | RESPIRATORY_TRACT | 2 refills | Status: AC | PRN

## 2021-03-18 MED ORDER — CARVEDILOL 6.25 MG PO TABS: 6.2500 mg | ORAL_TABLET | Freq: Two times a day (BID) | ORAL | 6 refills | Status: DC

## 2021-03-18 NOTE — Progress Notes (Signed)
[address]       [place] [address]             [phone]     HPI: [name] returns for scheduled follow-up regarding lung cancer screening CT.  Roy [name] is a 50 year old gentleman with a history of tobacco abuse, hypertension, coiling of a cerebral aneurysm, and pneumonia with parapneumonic effusion.  I did a right VATS and decortication in September 2019.  He had some small nodules we followed those up.  They eventually resolved.  He met criteria for low-dose lung cancer screening.  He did not have a primary care physician so we plan to do that through our office.  He still has not been able to establish with a primary care physician.  He continues to smoke.  He has been taking losartan 100 mg daily.  I think he was originally prescribed 50 mg daily but has been taking 2 of the tablets.  He is no longer taking Norvasc.  Past Medical History:  Diagnosis Date  . Aneurysm ([place])   . Cardiomyopathy ([place])    pt sts he no longer has it  . Empyema of pleural space ([place]) 08/2018  . Hypertension   . Renal calculi   . Tobacco abuse     Current Outpatient Medications  Medication Sig Dispense Refill  . acetaminophen (TYLENOL) 500 MG tablet Take 2 tablets (1,000 mg total) by mouth every 6 (six) hours as needed for mild pain or fever. 30 tablet 0  . carvedilol (COREG) 6.25 MG tablet Take 1 tablet (6.25 mg total) by mouth 2 (two) times daily with a meal. 60 tablet 6  . dextromethorphan 15 MG/5ML syrup Take 10 mLs (30 mg total) by mouth 4 (four) times daily as needed for cough. 120 mL 0  . guaiFENesin (MUCINEX) 600 MG 12 hr tablet Take 1 tablet (600 mg total) by mouth 2 (two) times daily. 20 tablet 1  . hydrOXYzine (ATARAX/VISTARIL) 25 MG tablet Take 1 tablet (25 mg total) by mouth every 6 (six) hours as needed for anxiety. 15 tablet 0  . lactobacillus acidophilus & bulgar (LACTINEX) chewable tablet Chew 1 tablet by mouth 3 (three) times daily with meals. 30  tablet 0  . losartan (COZAAR) 50 MG tablet Take 100 mg by mouth daily.    . traZODone (DESYREL) 100 MG tablet Take 1 tablet (100 mg total) by mouth at bedtime as needed for sleep. As needed For sleep and anxiety 30 tablet 2  . albuterol (VENTOLIN HFA) 108 (90 Base) MCG/ACT inhaler Inhale 2 puffs into the lungs every 6 (six) hours as needed for wheezing or shortness of breath. 8 g 2   No current facility-administered medications for this visit.    Physical Exam BP (!) 186/121 (BP Location: Right Arm, Patient Position: Sitting)   Pulse 72   Resp 20   Wt 276 lb (125.2 kg)   SpO2 94% Comment: RA  BMI 34.51 kg/m  50 year old male in no acute distress Alert and oriented x3 with no focal deficits Lungs clear bilaterally, no rales or wheezing Cardiac regular rate and rhythm with a normal S1 and S2 No clubbing cyanosis or edema  Diagnostic Tests: CT CHEST WITHOUT CONTRAST LOW-DOSE FOR LUNG CANCER SCREENING  TECHNIQUE: Multidetector CT imaging of the chest was performed following the standard protocol without IV contrast.  COMPARISON:  Standard CT chest 02/27/2020.  FINDINGS: Cardiovascular: The heart size is normal. No substantial pericardial effusion. No thoracic  aortic aneurysm.  Mediastinum/Nodes: No mediastinal lymphadenopathy. No evidence for gross hilar lymphadenopathy although assessment is limited by the lack of intravenous contrast on today's study. The esophagus has normal imaging features. There is no axillary lymphadenopathy.  Lungs/Pleura: Centrilobular emphsyema noted. Pleuroparenchymal scarring noted right lung base. No suspicious pulmonary nodule or mass. No focal airspace consolidation. No pleural effusion.  Upper Abdomen: Unremarkable.  Musculoskeletal: No worrisome lytic or sclerotic osseous abnormality.  IMPRESSION: 1. Lung-RADS 1, negative. Continue annual screening with low-dose chest CT without contrast in 12 months. 2.  Emphysema.  ([id number]-T15.9)   Electronically Signed   By: Misty Stanley M.D.   On: 03/18/2021 16:00 I personally reviewed the CT images and concur with the findings noted above  Impression: Roy [name] is a 50 year old man with a history of tobacco abuse, hypertension, coiling of a cerebral aneurysm, and pneumonia with parapneumonic effusion.  He had a right VATS for decortication in 2019.  He did well with that surgery.  Follow-up CT had some lung nodules.  We will follow those until they are resolved.  He now is being followed with low-dose CT for lung cancer screening.  Tobacco abuse-continues to smoke.  Lung cancer screening-CT lung RADS 1.  Plan repeat CT in 1 year  Hypertension-his blood pressure is markedly elevated today.  On his initial check his systolic was 726.  On follow-up he was 203 systolic.  He has been taking Cozaar 100 mg daily.  He has had issues with a beta-blocker in the past causing ED.,  Started on low-dose Coreg 6.25 mg p.o. twice daily and see how he does with that.  If he can tolerate that we will try to push the dose later on.  Plan: We will plan to repeat a low-dose lung cancer screening CT in 1 year Start Coreg 6.25 mg p.o. twice daily Monitor BP at home Return in 6 weeks for follow-up regarding blood pressure.  We will try to help establish with a primary care physician.  We will place a referral to [facility].  I am not sure if they accept Medicaid patients.  If not, will try referring somewhere else.  I spent over 20 minutes in review of records, images, and consultation with [name] today. Roy Nakayama, MD [facility] [phone]

## 2021-04-22 ENCOUNTER — Ambulatory Visit: Payer: Medicaid Other | Admitting: Thoracic Surgery (Cardiothoracic Vascular Surgery)

## 2021-05-13 ENCOUNTER — Ambulatory Visit: Payer: Self-pay | Admitting: Thoracic Surgery (Cardiothoracic Vascular Surgery)

## 2021-06-10 ENCOUNTER — Ambulatory Visit: Payer: Self-pay | Admitting: Thoracic Surgery (Cardiothoracic Vascular Surgery)

## 2021-12-09 ENCOUNTER — Emergency Department
Admission: EM | Admit: 2021-12-09 | Discharge: 2021-12-09 | Disposition: A | Discharge status: Home / Self Care | Payer: Commercial Managed Care - HMO | Attending: Emergency Medicine | Admitting: Emergency Medicine

## 2021-12-09 ENCOUNTER — Other Ambulatory Visit: Payer: Self-pay

## 2021-12-09 ENCOUNTER — Emergency Department: Payer: Commercial Managed Care - HMO

## 2021-12-09 DIAGNOSIS — Z87891 Personal history of nicotine dependence: Secondary | ICD-10-CM | POA: Insufficient documentation

## 2021-12-09 DIAGNOSIS — M542 Cervicalgia: Secondary | ICD-10-CM | POA: Diagnosis present

## 2021-12-09 DIAGNOSIS — Z79899 Other long term (current) drug therapy: Secondary | ICD-10-CM | POA: Diagnosis not present

## 2021-12-09 DIAGNOSIS — I10 Essential (primary) hypertension: Secondary | ICD-10-CM | POA: Insufficient documentation

## 2021-12-09 MED ORDER — CYCLOBENZAPRINE HCL 10 MG PO TABS
10.0000 mg | ORAL_TABLET | Freq: Once | ORAL | Status: AC
  Administered 2021-12-09: 18:00:00 10 mg via ORAL
  Filled 2021-12-09: qty 1

## 2021-12-09 MED ORDER — PREDNISONE 20 MG PO TABS
60.0000 mg | ORAL_TABLET | Freq: Once | ORAL | Status: AC
  Administered 2021-12-09: 18:00:00 60 mg via ORAL
  Filled 2021-12-09: qty 3

## 2021-12-09 MED ORDER — CYCLOBENZAPRINE HCL 10 MG PO TABS: 10.0000 mg | ORAL_TABLET | Freq: Three times a day (TID) | ORAL | 0 refills | Status: DC | PRN

## 2021-12-09 MED ORDER — OXYCODONE-ACETAMINOPHEN 5-325 MG PO TABS
1.0000 | ORAL_TABLET | Freq: Once | ORAL | Status: AC
  Administered 2021-12-09: 18:00:00 1 via ORAL
  Filled 2021-12-09: qty 1

## 2021-12-09 MED ORDER — PREDNISONE 20 MG PO TABS: 60.0000 mg | ORAL_TABLET | Freq: Every day | ORAL | 0 refills | Status: AC

## 2021-12-09 MED ORDER — OXYCODONE-ACETAMINOPHEN 7.5-325 MG PO TABS: 1.0000 | ORAL_TABLET | Freq: Four times a day (QID) | ORAL | 0 refills | Status: AC | PRN

## 2021-12-09 NOTE — ED Provider Notes (Signed)
[facility] Emergency Department Provider Note ____________________________________________   Event Date/Time   First MD Initiated Contact with Patient 12/09/21 1736     (approximate)  I have reviewed the triage vital signs and the nursing notes.   HISTORY  Chief Complaint Neck Pain    HPI Roy [name] is a 50 y.o. male with PMH as noted below who presents with left-sided neck pain, acute onset 2 weeks ago.  The patient states that he woke up with it.  It has been intermittent since then.  It is in the left side of his neck and sometimes radiates to the left upper arm.  He denies any associated chest pain, difficulty breathing, or any weakness or numbness in the left arm.  He had no preceding trauma and believes that he slept on it wrong.  He denies any weakness or numbness in the legs.  The patient was prescribed Flexeril and oxycodone and states that it has helped only marginally.  Past Medical History:  Diagnosis Date   Aneurysm ([place])    Cardiomyopathy ([place])    pt sts he no longer has it   Empyema of pleural space ([place]) 08/2018   Hypertension    Renal calculi    Tobacco abuse     Patient Active Problem List   Diagnosis Date Noted   Sepsis ([employer]) 09/02/2018   Pleural effusion on right 09/01/2018   Multifocal pneumonia 09/01/2018   Essential hypertension 09/01/2018   Tobacco abuse 09/01/2018   Abnormal LFTs 09/01/2018   Empyema lung ([place]) 09/01/2018    Past Surgical History:  Procedure Laterality Date   ANEURYSM COILING     "a few years ago"   DECORTICATION Right 09/02/2018   Procedure: DECORTICATION;  Surgeon: Melrose Nakayama, MD;  Location: [place];  Service: Thoracic;  Laterality: Right;   PLEURAL EFFUSION DRAINAGE Right 09/02/2018   Procedure: DRAINAGE OF PLEURAL EFFUSION;  Surgeon: Melrose Nakayama, MD;  Location: [place];  Service: Thoracic;  Laterality: Right;   VIDEO ASSISTED THORACOSCOPY (VATS)/THOROCOTOMY Right 09/02/2018    Procedure: VIDEO ASSISTED THORACOSCOPY (VATS)/possible THOROCOTOMY;  Surgeon: Melrose Nakayama, MD;  Location: [place];  Service: Thoracic;  Laterality: Right;    Prior to Admission medications   Medication Sig Start Date End Date Taking? Authorizing Provider  cyclobenzaprine (FLEXERIL) 10 MG tablet Take 1 tablet (10 mg total) by mouth 3 (three) times daily as needed for muscle spasms. 12/09/21  Yes Arta Silence, MD  oxyCODONE-acetaminophen (PERCOCET) 7.5-325 MG tablet Take 1 tablet by mouth every 6 (six) hours as needed for up to 5 days for severe pain. 12/09/21 12/14/21 Yes Arta Silence, MD  predniSONE (DELTASONE) 20 MG tablet Take 3 tablets (60 mg total) by mouth daily with breakfast for 5 days. Start the day after the ER visit 12/10/21 12/15/21 Yes Arta Silence, MD  acetaminophen (TYLENOL) 500 MG tablet Take 2 tablets (1,000 mg total) by mouth every 6 (six) hours as needed for mild pain or fever. 09/07/18   Roxan Hockey, MD  albuterol (VENTOLIN HFA) 108 (90 Base) MCG/ACT inhaler Inhale 2 puffs into the lungs every 6 (six) hours as needed for wheezing or shortness of breath. 03/18/21   Melrose Nakayama, MD  carvedilol (COREG) 6.25 MG tablet Take 1 tablet (6.25 mg total) by mouth 2 (two) times daily with a meal. 03/18/21   Melrose Nakayama, MD  dextromethorphan 15 MG/5ML syrup Take 10 mLs (30 mg total) by mouth 4 (four) times daily as  needed for cough. 09/27/18   Melrose Nakayama, MD  guaiFENesin (MUCINEX) 600 MG 12 hr tablet Take 1 tablet (600 mg total) by mouth 2 (two) times daily. 09/07/18   Roxan Hockey, MD  hydrOXYzine (ATARAX/VISTARIL) 25 MG tablet Take 1 tablet (25 mg total) by mouth every 6 (six) hours as needed for anxiety. 09/07/18   Roxan Hockey, MD  lactobacillus acidophilus & bulgar (LACTINEX) chewable tablet Chew 1 tablet by mouth 3 (three) times daily with meals. 09/07/18   Roxan Hockey, MD  losartan (COZAAR) 50 MG tablet Take 100 mg by  mouth daily. 07/29/20   [provider]  traZODone (DESYREL) 100 MG tablet Take 1 tablet (100 mg total) by mouth at bedtime as needed for sleep. As needed For sleep and anxiety 09/07/18   Roxan Hockey, MD    Allergies Patient has no known allergies.  Family History  Problem Relation Age of Onset   Lung cancer Father    Throat cancer Father     Social History Social History   Tobacco Use   Smoking status: Former    Types: Cigarettes   Smokeless tobacco: Never  Vaping Use   Vaping Use: Some days  Substance Use Topics   Drug use: Not Currently    Types: Marijuana    Review of Systems  Constitutional: No fever/chills Eyes: No visual changes. ENT: No sore throat. Cardiovascular: Denies chest pain. Respiratory: Denies shortness of breath. Gastrointestinal: No vomiting or diarrhea.  Genitourinary: Negative for flank pain. Musculoskeletal: Negative for back pain.  Positive for neck pain. Skin: Negative for rash. Neurological: Negative for headaches, focal weakness or numbness.   ____________________________________________   PHYSICAL EXAM:  VITAL SIGNS: ED Triage Vitals  Enc Vitals Group     BP 12/09/21 1545 (!) 159/101     Pulse Rate 12/09/21 1545 79     Resp 12/09/21 1545 18     Temp 12/09/21 1545 98.3 F (36.8 C)     Temp Source 12/09/21 1545 Oral     SpO2 12/09/21 1545 94 %     Weight 12/09/21 1550 260 lb (117.9 kg)     Height 12/09/21 1550 6\' 3"  (1.905 m)     Head Circumference --      Peak Flow --      Pain Score 12/09/21 1550 8     Pain Loc --      Pain Edu? --      Excl. in [place]? --     Constitutional: Alert and oriented. Well appearing and in no acute distress. Eyes: Conjunctivae are normal.  Head: Atraumatic. Nose: No congestion/rhinnorhea. Mouth/Throat: Mucous membranes are moist.   Neck: Normal range of motion.  Left cervical paraspinal and trapezius area mild tenderness. Cardiovascular: Normal rate, regular rhythm. Good peripheral  circulation. Respiratory: Normal respiratory effort.  No retractions.  Gastrointestinal: No distention.  Musculoskeletal: No lower extremity edema.  Extremities warm and well perfused.  Neurologic:  Normal speech and language.  5/5 motor strength and intact sensation of bilateral upper extremities. Skin:  Skin is warm and dry. No rash noted. Psychiatric: Mood and affect are normal. Speech and behavior are normal.  ____________________________________________   LABS (all labs ordered are listed, but only abnormal results are displayed)  Labs Reviewed - No data to display ____________________________________________  EKG   ____________________________________________  RADIOLOGY  XR cervical spine: IMPRESSION:  Mild multilevel degenerative disc disease. No acute abnormality  seen    ____________________________________________   PROCEDURES  Procedure(s) performed: No  Procedures  Critical Care performed: No ____________________________________________   INITIAL IMPRESSION / ASSESSMENT AND PLAN / ED COURSE  Pertinent labs & imaging results that were available during my care of the patient were reviewed by me and considered in my medical decision making (see chart for details).   50 year old male presents with atraumatic left neck pain for the last 2 weeks.  He was prescribed Flexeril and what he stated was oxycodone (although it appears he was actually prescribed hydrocodone) last week with minimal relief.  He has no weakness or paresthesias.  On exam of the vital signs are normal except for hypertension.  He has full range of motion of the neck, some mild paraspinal trapezius area tenderness, and a normal upper extremity neuro exam.  Presentation is consistent with muscle strain/spasm versus cervical radiculopathy.  X-ray obtained from triage shows degenerative disc disease with no concerning findings.  Given the lack of any neurodeficits there is no indication for  emergent MRI or other advanced imaging.  At this time the patient is stable for discharge home.  I have prescribed him a short course of a slightly higher dose of oxycodone as well as additional Flexeril and prednisone for likely radiculopathy.  The patient will follow up with his PMD.  I gave him thorough return precautions and he expressed understanding.   ____________________________________________   FINAL CLINICAL IMPRESSION(S) / ED DIAGNOSES  Final diagnoses:  Neck pain      NEW MEDICATIONS STARTED DURING THIS VISIT:  Discharge Medication List as of 12/09/2021  5:56 PM     START taking these medications   Details  cyclobenzaprine (FLEXERIL) 10 MG tablet Take 1 tablet (10 mg total) by mouth 3 (three) times daily as needed for muscle spasms., Starting Tue 12/09/2021, Normal    oxyCODONE-acetaminophen (PERCOCET) 7.5-325 MG tablet Take 1 tablet by mouth every 6 (six) hours as needed for up to 5 days for severe pain., Starting Tue 12/09/2021, Until Sun 12/14/2021 at 2359, Normal    predniSONE (DELTASONE) 20 MG tablet Take 3 tablets (60 mg total) by mouth daily with breakfast for 5 days. Start the day after the ER visit, Starting Wed 12/10/2021, Until Mon 12/15/2021, Normal         Note:  This document was prepared using Dragon voice recognition software and may include unintentional dictation errors.    Arta Silence, MD 12/09/21 [phone]

## 2021-12-09 NOTE — ED Provider Notes (Signed)
Emergency Medicine Provider Triage Evaluation Note  Roy [name], a 50 y.o. male  was evaluated in triage.  Pt complains of left-sided neck pain with referral into the left upper extremity.  Patient reports onset 2 weeks ago after waking up he denies any preceding injury, trauma, fall.  He was evaluated by his PCP and was given a dose of muscle relaxants and pain medicine.  He presents with persistent symptoms not improved by the prescription medications.  Denies any chest pain or shortness of breath.  Review of Systems  Positive: Left neck pain, LUE referral Negative: CP, paralysis  Physical Exam  BP (!) 159/101 (BP Location: Left Arm)    Pulse 79    Temp 98.3 F (36.8 C) (Oral)    Resp 18    Ht 6\' 3"  (1.905 m)    Wt 117.9 kg    SpO2 94%    BMI 32.50 kg/m  Gen:   Awake, no distress  NAD Resp:  Normal effort CTA MSK:   Moves extremities without difficulty.  Normal cervical range of motion, normal grip distally. Other:  CVS: RRR  Medical Decision Making  Medically screening exam initiated at 3:54 PM.  Appropriate orders placed.  [place] [name] was informed that the remainder of the evaluation will be completed by another provider, this initial triage assessment does not replace that evaluation, and the importance of remaining in the ED until their evaluation is complete.  Patient ED evaluation of left-sided neck pain with left upper trinity referral.  Presents to the ED for evaluation of symptoms denies any injury.   Melvenia Needles, PA-C 12/09/21 1556    Arta Silence, MD 12/09/21 1616

## 2021-12-09 NOTE — ED Triage Notes (Signed)
Pt to ED for left sided neck pain that started 2 weeks ago. Went to PCP and was given muscle relaxers and oxycodone. Was told it was muscle spasms.  States pain worsens with movement, radiates down left side of arm, stops at elbow.   MSE jenise in triage

## 2021-12-09 NOTE — ED Triage Notes (Signed)
Presents with left sided neck pain  states pain is moving into arm

## 2021-12-09 NOTE — ED Notes (Signed)
D/C, new RX's, and reasons to return to ED discussed with pt, pt verbalized understanding. Pt ambulatory with steady gait on D/C. NAD noted.

## 2021-12-09 NOTE — Discharge Instructions (Addendum)
Take the prednisone daily as prescribed.  This will decrease inflammation and should help significantly with the pain.  You may take the cyclobenzaprine and oxycodone as needed.  Follow-up with your primary care doctor in the next 1 to 2 weeks particular if the symptoms do not significantly improve.  You may need an outpatient MRI or other work-up if the symptoms or not getting better.  Return to the ER for new or worsening pain, weakness or numbness, difficulty holding something in the left hand, chest pain, difficulty breathing, or any other new or worsening symptoms that concern you.

## 2022-10-02 NOTE — ED Provider Notes (Signed)
 Emergency Department Provider Note   ED Clinical Impression   Final diagnoses:  Pain due to dental caries (Primary)  Acute pain of left knee    Initial Impression, ED Course, Assessment and Plan   Roy [name] is a 51 y.o. male who presents with multiple complaints including left knee pain as well as left lower dental pain.  Pain in the knee has been ongoing for nearly a month with no known injury.  No fever, but has had intermittent swelling.  Additionally he has complaints of pain in his left lower tooth which has been ongoing now for a week.  He is pending dental evaluation.  He has no signs of dental infection/abscess on exam.  Knee exam is also reassuring with no significant effusion, erythema/warmth.  He is able to ambulate.  Low suspicion for septic joint/DVT.  He does have intact pulses.  X-ray imaging reveals no fracture or malalignment.  Patient does have osteoarthritis on x-ray.  I have discussed x-ray findings with the patient.  Encouraged continued use of diclofenac as well as over-the-counter Tylenol  and topical Voltaren.  He has been provided orthopedic referral as well as contact information.  Encouraged rest, ice, elevation and close orthopedic follow-up.  In regards to his dental pain I will discharge him on antibiotics as well as Peridex and dental follow-up information.  He has been provided strict return precautions, did express understanding, and is agreeable with plan.    Labs   Labs Reviewed - No data to display  Radiology   XR Knee 3 Views Left  Final Result    1. No acute fracture or malalignment.  2. Mild tricompartmental osteoarthritis.         History   Chief Complaint Knee Pain   HPI  Patient was seen by me at 11:09 AM.  Patient is a 51 y.o. male with a PMH of HTN, cardiomyopathy and aneurysm presenting to the ED for multiple complaints including knee pain and dental pain.  The patient reports 4 weeks of gradually worsening left  knee pain. He denies any recent falls or trauma. Of note, he drives a lot for work and initially noticed the knee pain after he drove to [place] and back. Denies any erythema or edema. He is able to ambulate without difficulty. He has been taking diclofenac and pregabalin with no symptomatic relief. He has not had any issues with his L knee in the past.  Additionally he reports a week of left lower tooth pain and intermittent facial swelling. He was given a mouth wash for his tooth pain which has not provided any symptomatic relief.  Previous chart, nursing notes, and vital signs reviewed.    Pertinent labs & imaging results that were available during my care of the patient were reviewed by me and considered in my medical decision making (see chart for details).  Past Medical History:  Diagnosis Date  . CHF (congestive heart failure) (CMS-HCC)    EF has normalized  . Hyperlipidemia   . Hypertension     Past Surgical History:  Procedure Laterality Date  . ARTERIAL ANEURYSM REPAIR     Cerebral     No current facility-administered medications for this encounter.  Current Outpatient Medications:  .  amoxicillin  (AMOXIL ) 500 MG capsule, Take 1 capsule (500 mg total) by mouth every eight (8) hours for 10 days., Disp: 30 capsule, Rfl: 0 .  atorvastatin  (LIPITOR) 40 MG tablet, Take 40 mg by mouth daily., Disp: , Rfl:  .  budesonide -formoteroL  (SYMBICORT ) 160-4.5 mcg/actuation inhaler, Symbicort  160 mcg-4.5 mcg/actuation HFA aerosol inhaler, Disp: , Rfl:  .  carvediloL  (COREG ) 6.25 MG tablet, carvedilol  6.25 mg tablet, Disp: , Rfl:  .  chlorhexidine (PERIDEX) 0.12 % solution, 10mL swish and spit 2 times a day for 5-7 days, Disp: 118 mL, Rfl: 0 .  cyclobenzaprine  (FLEXERIL ) 10 MG tablet, , Disp: , Rfl:  .  diclofenac (VOLTAREN) 75 MG EC tablet, diclofenac sodium 75 mg tablet,delayed release, Disp: , Rfl:  .  hydrALAZINE  (APRESOLINE ) 25 MG tablet, hydralazine  25 mg tablet, Disp: , Rfl:  .   hydroCHLOROthiazide  (HYDRODIURIL ) 25 MG tablet, hydrochlorothiazide  25 mg tablet, Disp: , Rfl:  .  hydrOXYzine  (ATARAX ) 25 MG tablet, Take 25 mg by mouth every six (6) hours as needed., Disp: , Rfl:  .  losartan (COZAAR) 50 MG tablet, Take 1.5 tablets (75 mg total) by mouth daily., Disp: 45 tablet, Rfl: 0 .  nicotine  polacrilex (NICORETTE) 4 MG gum, Chew 1 each (4 mg total) and place it to cheek every two (2) hours as needed for smoking cessation. Weeks 1-6: Chew 1 piece of gum every 1-2 hours as needed, Disp: 110 each, Rfl: 3 .  pregabalin (LYRICA) 75 MG capsule, , Disp: , Rfl:  .  sildenafiL (VIAGRA) 100 MG tablet, sildenafil 100 mg tablet, Disp: , Rfl:   Allergies Patient has no known allergies.  History reviewed. No pertinent family history.  Social History Social History   Tobacco Use  . Smoking status: Every Day    Packs/day: 1.00    Years: 20.00    Additional pack years: 0.00    Total pack years: 20.00    Types: Cigarettes  . Smokeless tobacco: Never  . Tobacco comments:    pt smokes 1ppd, pt expressed desire to quit   Substance Use Topics  . Alcohol use: Yes    Alcohol/week: 3.0 standard drinks of alcohol    Types: 3 Shots of liquor per week    Review of Systems  Constitutional: Negative for fever or chills.  ENT: Positive for left lower dental pain. Cardiovascular: Negative for chest pain. Respiratory: Negative for shortness of breath or cough. Gastrointestinal: Negative for abdominal pain, nausea, vomiting, constipation or diarrhea. Genitourinary: Negative for dysuria, urgency, frequency, hesitancy, hematuria.  Musculoskeletal:  Positive for L knee pain.  Skin: Negative for rash.  Neurological: Negative for headaches, weakness, numbness/tingling. Psych: Negative for SI, HI.   Physical Exam   VITAL SIGNS:   Vitals:   10/02/22 1057  BP: 155/89  Pulse: 65  Resp: 18  Temp: 36.9 C (98.4 F)  TempSrc: Oral  SpO2: 97%     Constitutional: Alert and  oriented. Well appearing and in no distress. Eyes: Conjunctivae are normal.  ENT:  No congestion. No epistaxis.  Mucous membranes are moist without lesions/ulcerations. Posterior  oropharynx is patent.  Neck: Supple.  Full ROM without pain. Hematological/Lymphatic/Immunilogical: No cervical lymphadenopathy. Cardiovascular: Normal rate, regular rhythm.  No gallops, murmurs, or clicks. Respiratory: Normal respiratory effort. Breath sounds are normal. Gastrointestinal: Soft and nontender. BS active. No guarding or rigidity. Musculoskeletal: Exam of the left knee reveals no gross deformity.  No significant contusions or swelling.  He is complaining of pain along the medial joint line.  He can flex greater than 90 degrees.  He has full extension.  He can straight leg raise.  Pulses are intact. Neurologic: Normal speech and language.  Patient with equal and intact strength in the upper and lower extremities.  There is  no focal weakness or deficits.   Skin: Skin is warm, dry and intact. No rash noted. Psychiatric: Mood and affect are normal. Speech and behavior are normal.     Documentation assistance was provided by Chiquita Graft, Scribe, on October 02, 2022 at 11:09 AM for Alphia Barge, NP.    Documentation assistance was provided by the scribe in my presence.  The documentation recorded by the scribe has been reviewed by me and accurately reflects the services I personally performed.     Barge Alphia Bowman, FNP 10/02/22 1310

## 2023-02-18 IMAGING — CT CT CHEST LUNG CANCER SCREENING LOW DOSE W/O CM
3 of 5 series · 13 of 40 positions shown, 14 images · non-contrast
Comparison: Standard CT chest 02/27/2020.

CLINICAL DATA: 49-year-old male with 25 pack-year history of
smoking. Lung cancer screening.

EXAM:
CT CHEST WITHOUT CONTRAST LOW-DOSE FOR LUNG CANCER SCREENING
TECHNIQUE: Multidetector CT imaging of the chest was performed following the
standard protocol without IV contrast.

[Series 2: lung 5.00 br40 axial · axial · 0.69mm/px · z∈[-1050,-935]mm · 2 of 71 slices shown, 3 images]
[im 24/71  mediastinal]
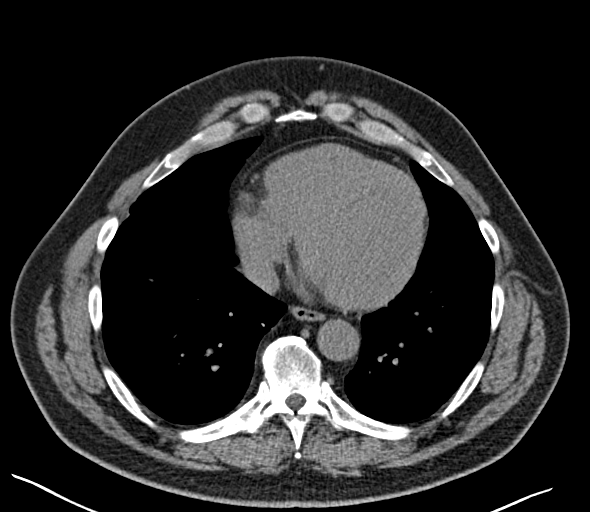
[im 24/71  lung]
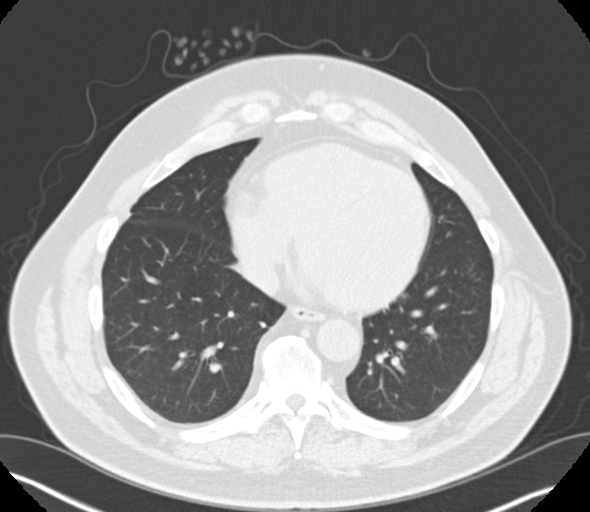
[im 47/71  lung]
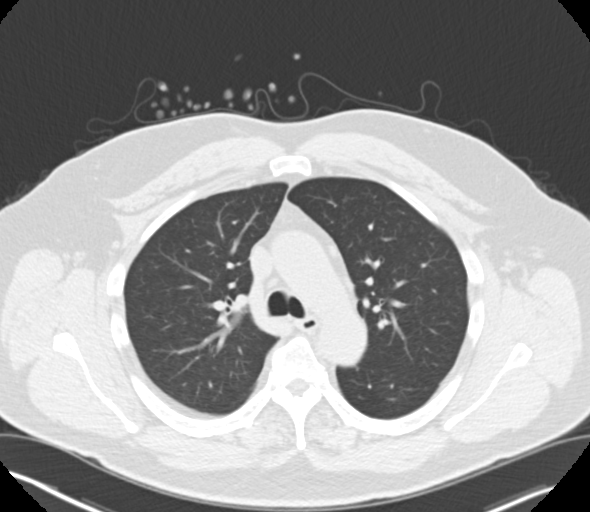

[Series 4: lung 1.00 br44 cor · coronal · 0.70mm/px · 3 of 375 slices shown]
[im 75/375  lung]
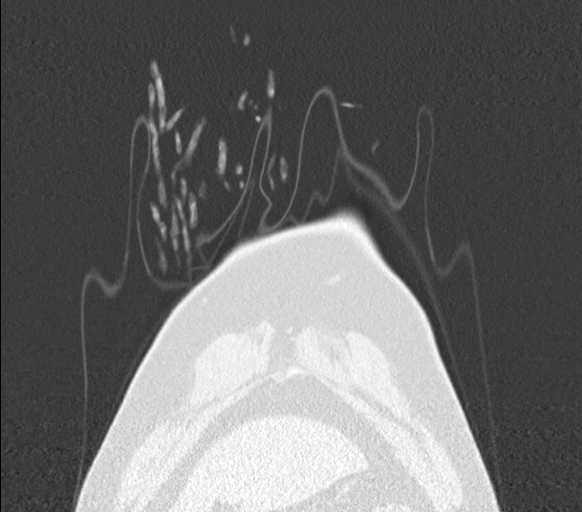
[im 150/375  lung]
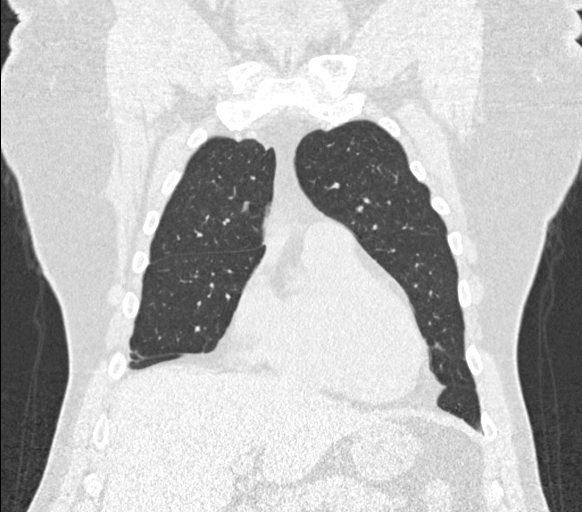
[im 225/375  lung]
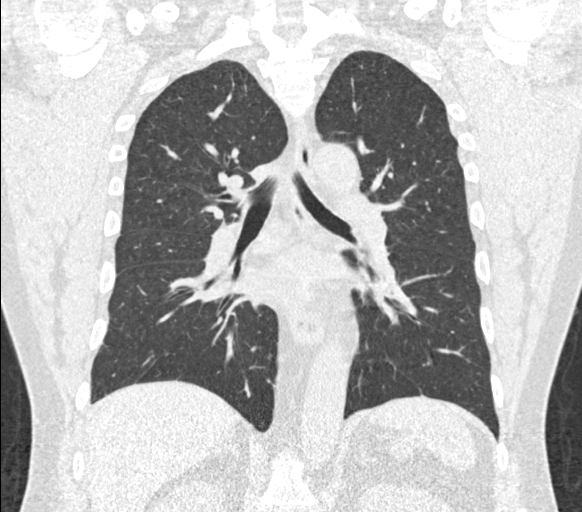

[Series 9: lung 1.00 br60 axial · axial · 0.80mm/px · z∈[-1110,-843]mm · 8 of 327 slices shown]
[im 30/327  lung]
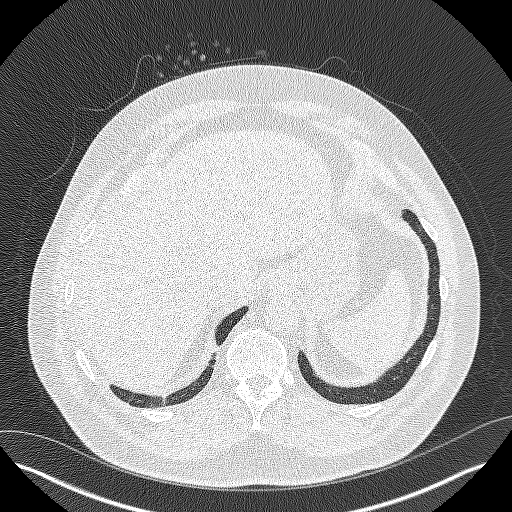
[im 60/327  lung]
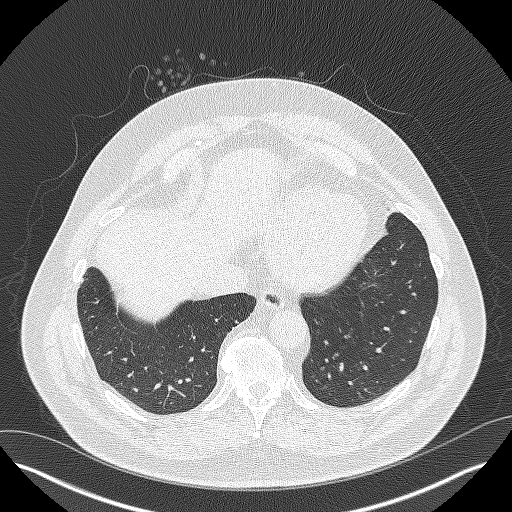
[im 104/327  lung]
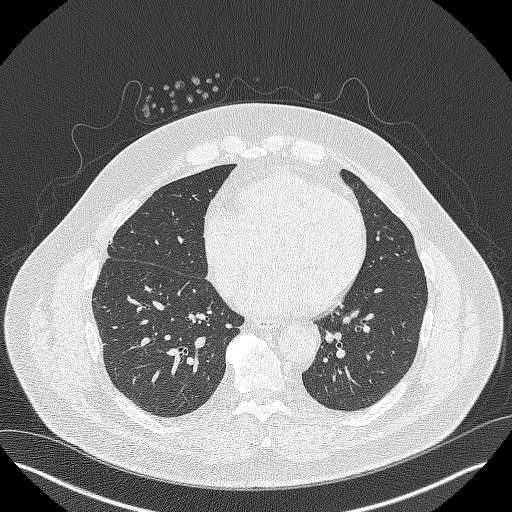
[im 149/327  lung]
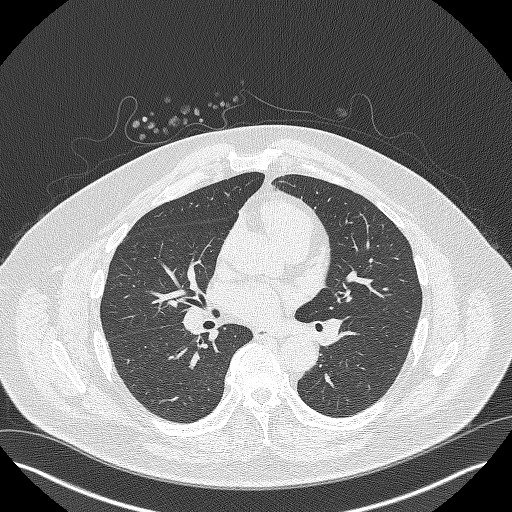
[im 178/327  lung]
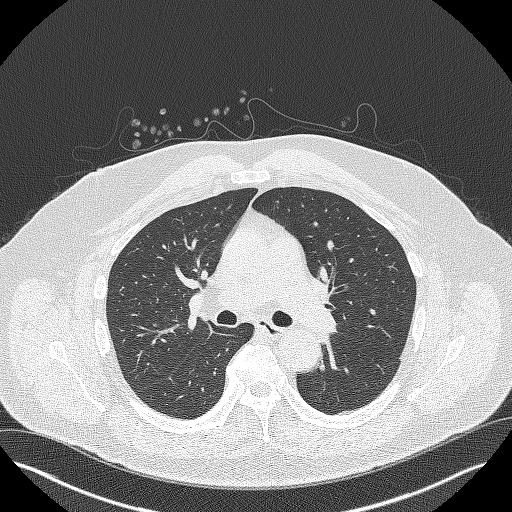
[im 223/327  lung]
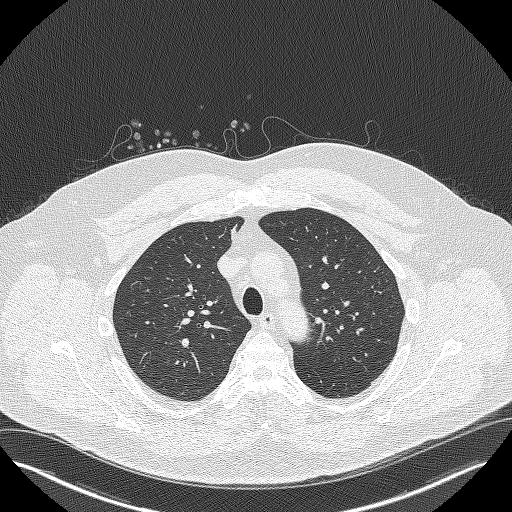
[im 267/327  lung]
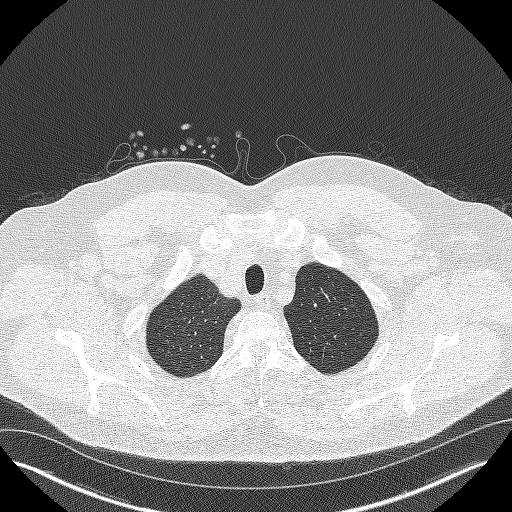
[im 297/327  lung]
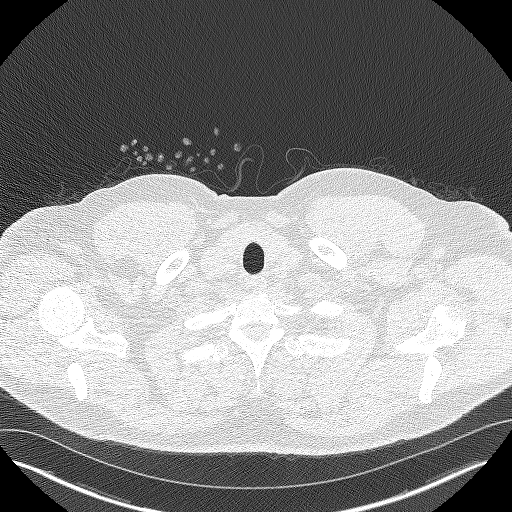

[13 of 40 positions shown; findings below may reference images not displayed]

FINDINGS: Cardiovascular: The heart size is normal. No substantial pericardial
effusion. No thoracic aortic aneurysm.

Mediastinum/Nodes: No mediastinal lymphadenopathy. No evidence for
gross hilar lymphadenopathy although assessment is limited by the
lack of intravenous contrast on today's study. The esophagus has
normal imaging features. There is no axillary lymphadenopathy.

Lungs/Pleura: Centrilobular emphsyema noted. Pleuroparenchymal
scarring noted right lung base. No suspicious pulmonary nodule or
mass. No focal airspace consolidation. No pleural effusion.

Upper Abdomen: Unremarkable.

Musculoskeletal: No worrisome lytic or sclerotic osseous
abnormality.
IMPRESSION: 1. Lung-RADS 1, negative. Continue annual screening with low-dose
chest CT without contrast in 12 months.
2.  Emphysema. ([id number]-LKV.6)

## 2023-07-26 NOTE — Progress Notes (Signed)
 Cardiology Note  Requesting Provider: Ezzard Mliss Decent,*  Primary Provider: Rollene Therisa Norris, FNP      Assessment & Plan: HTN - Blood pressure well-controlled.  -Continue Losartan to 50 mg, Chlorthalidone 25 mg once daily and carvedilol  6.25 mg twice daily - Encouraged increase in physical activity  - Continue dietary efforts    HLD - Lipid panel on 02/25/23- LDL was 82 and Triglycerides was 340. His repeat lipid panel on 07/23/23- LDL was 54 and Triglycerides was 660. His triglycerides are elevated most likely due to diet and alcohol use.   - Continue atorvastatin  40 mg - Discussed limiting alcohol intake and sugar intake at length today.  - Will recheck fasting triglycerides at next visit.  - Follow up in 3-4 months Lab Results  Component Value Date   LDL 82 02/25/2023   Cardiomyopathy, nonischemic with recovered LV function  Likely due to cocaine and alcohol use with previous EF of 45%. Most recent EF is 55-60% and largely resolved. Encouraged continued abstention of drug use. Currently on losartan 25 mg which we are increasing to 50 mg.  He is also on carvedilol  6.25 mg twice daily.  He does not need diuretic  Smoking Cessation  - He has quit smoking cigarettes with the help of bupropion and nicotine  patches. He currently uses a low nicotine  vape.   Return in about 4 months (around 11/25/2023).  Future Appointments  Date Time Provider Department Center  09/07/2023 11:00 AM Dunn, Ronal Speed, ANP [facility]  10/18/2023  1:30 PM Brady Charmaine Gunner, [place] [facility]  11/01/2023  3:45 PM Lewis, Julie MacMillan, ANP [facility]   I personally spent 35 minutes face-to-face and non-face-to-face in the care of this patient, which includes all pre, intra, and post visit time on the date of service.  All documented time was specific to the E/M visit and does not include any procedures that may have been performed.   History of  Present Illness:  Roy [name] is a 52 y.o. male with a history of hypertension, hyperlipidemia, bradycardia, prediabetes, mild nonflow limiting coronary artery disease, cerebral aneurysm post coil, hematuria, history of polysubstance abuse, right-sided pleural effusion status post thoracentesis. He presents today for blood pressure follow-up. He reports compliance with his blood pressure medications. He does not report headaches, blurry vision, dizziness, chest pain, SOB or palpitations. He does not currently engage in exercise. He has stopped smoking cigarettes with the help of bupropion and nicotine  patches. He does report use of a low nicotine  vape. He has cut back on his alcohol consumption. He notes he drinks about 2-3 per week and occasionally has 4 shots at a time. He denies current illicit drug use.    Cardiovascular History: 1. Hypertension  2. Hyperlipidemia. 3. Minimal coronary artery disease for a cardiac catheterization performed in 2010 w/ hx of acute MI 4. Cardiomyopathy, likely due to cocaine and alcohol use with previous EF of 45%. He is currently normalized for echo. 5. History of polysubstance abuse including tobacco, alcohol,marijuana and cocaine. Currently, he still uses tobacco and marijuana. 6. Cerebral aneurysm status post coil at [facility]. 7. Right-sided pleural effusion s/p thoracentesis (08/28/2018) 8. Bradycardia  Interventions / Surgery:  Imaging: Echocardiogram 02/25/2023 Summary   1. The left ventricle is normal in size with mildly increased wall thickness.   2. The left ventricular systolic function is normal, LVEF is visually estimated at > 55%.   3. There is grade I diastolic dysfunction (impaired relaxation).  4. The right ventricle is normal in size, with normal systolic function.  Number  Echo 08/09/20  1. The left ventricle is normal in size with normal wall thickness.   2. The left ventricular systolic function is normal, LVEF is visually  estimated at 55-60%.   3. The right ventricle is normal in size, with normal systolic function.   4. The left atrium is moderately dilated in size.   5. The right atrium is mildly dilated  in size.   6. There is mild mitral valve regurgitation.   7. Pulmonary systolic pressure cannot be estimated due to insufficient TR test results Starlet that showed jet.  Chest CT 02/27/20 1. Signs of pleural and parenchymal scarring in the right chest.  2. The juxta of diaphragmatic pulmonary nodule in the right chest  has resolved.  3. No acute cardiopulmonary process demonstrated.  4. Mild cardiomegaly with minimal calcified coronary artery disease.   Past Medical & Surgical History: Past Medical History:  Diagnosis Date  . Arthritis   . CAD (coronary artery disease)   . Cardiomyopathy (CMS-HCC)    has improved  . CHF (congestive heart failure) (CMS-HCC)    EF has normalized  . Chronic back pain    herniated disc L5  . ETOH abuse   . Hyperlipidemia   . Hypertension   . Illicit drug use    past history of cocaine and marijuana use  . Kidney stones   . MI (myocardial infarction) (CMS-HCC)   . Obesity (BMI 30-39.9)   . Smoker      Allergies: No Known Allergies   Current Medications:  Current Outpatient Medications:  .  atorvastatin  (LIPITOR) 40 MG tablet, Take 1 tablet (40 mg total) by mouth daily., Disp: , Rfl:  .  budesonide -formoteroL  (SYMBICORT ) 160-4.5 mcg/actuation inhaler, Symbicort  160 mcg-4.5 mcg/actuation HFA aerosol inhaler, Disp: , Rfl:  .  carvediloL  (COREG ) 6.25 MG tablet, Take 1 tablet (6.25 mg total) by mouth two (2) times a day., Disp: , Rfl:  .  chlorthalidone (HYGROTON) 25 MG tablet, , Disp: , Rfl:  .  hydrOXYzine  (ATARAX ) 25 MG tablet, Take 1 tablet (25 mg total) by mouth every six (6) hours as needed., Disp: , Rfl:  .  LIDODERM  5 % patch, , Disp: , Rfl:  .  losartan (COZAAR) 50 MG tablet, Take 1 tablet (50 mg total) by mouth daily., Disp: 90 tablet, Rfl: 3 .   PROAIR  HFA 90 mcg/actuation inhaler, , Disp: , Rfl:  .  sildenafiL (VIAGRA) 100 MG tablet, sildenafil 100 mg tablet, Disp: , Rfl:  .  tiotropium (SPIRIVA WITH HANDIHALER) 18 mcg inhalation capsule, , Disp: , Rfl:   Social History: Social History   Tobacco Use  . Smoking status: Every Day    Current packs/day: 1.00    Average packs/day: 1 pack/day for 20.0 years (20.0 ttl pk-yrs)    Types: Cigarettes  . Smokeless tobacco: Never  . Tobacco comments:    pt smokes 1ppd, pt expressed desire to quit   Substance Use Topics  . Alcohol use: Yes    Alcohol/week: 3.0 standard drinks of alcohol    Types: 3 Shots of liquor per week  He has no history on file for drug use.  Family History: His family history is not on file.  Review of Systems: Review of ten systems is negative or unremarkable except as stated above.  Physical Exam: VITAL SIGNS: BP 125/81 (BP Site: L Arm, BP Position: Sitting, BP Cuff Size: Large)  Pulse 73   Ht 190.5 cm (6' 3)   Wt (!) 132.3 kg (291 lb 9.6 oz)   SpO2 96%   BMI 36.45 kg/m  GENERAL: NAD. HEENT: Normocephalic and atraumatic. Conjunctivae and sclerae clear and anicteric. No xanthelasma.  CARDIOVASCULAR: RRR. Normal S1 and S2. No murmurs, rubs, or gallops.  RESPIRATORY: Normal respiratory effort without use of accessory muscles. Clear to auscultation bilaterally. ABDOMEN: Soft, not tender or distended. No abdominal bruit EXTREMITIES:  No pretibial or ankle edema. Ambulatory ability satisfactory. SKIN: No rashes, ecchymosis or petechiae. NEUROLOGIC: Appropriate mood and affect. Alert and oriented to person, place, and time. No gross motor or sensory deficits evident.  Wt Readings from Last 12 Encounters:  07/26/23 (!) 132.3 kg (291 lb 9.6 oz)  04/15/23 (!) 134.7 kg (297 lb)  04/12/23 (!) 133.8 kg (295 lb)  03/01/23 (!) 132.8 kg (292 lb 12.8 oz)  02/25/23 (!) 131.6 kg (290 lb 3.2 oz)  01/05/23 (!) 127.5 kg (281 lb 1.6 oz)  10/20/22 (!) 130.9 kg (288  lb 8 oz)  01/20/22 (!) 135.6 kg (299 lb)  07/28/20 (!) 117.1 kg (258 lb 2.5 oz)  10/22/14 (!) 118.8 kg (262 lb)  07/23/14 (!) 121.7 kg (268 lb 6.4 oz)  04/16/14 (!) 124.3 kg (274 lb)    Pertinent Laboratory Studies: Lab Results  Component Value Date   Triglycerides 340 (H) 02/25/2023   Triglycerides 135 07/28/2020   Triglycerides 265 (H) 03/12/2014   Triglycerides 173 (H) 02/01/2013   Triglycerides 243 (H) 03/21/2012   HDL 46 02/25/2023   HDL 32 (L) 07/28/2020   HDL 35 (L) 03/12/2014   HDL 40 02/01/2013   HDL 44 03/21/2012   Non-HDL Cholesterol 150 (H) 02/25/2023   Non-HDL Cholesterol 146 (H) 07/28/2020   Non HDL Chol.  179 03/12/2014   Non HDL Chol.  157 02/01/2013   Non HDL Chol.  226 03/21/2012   LDL Calculated 82 02/25/2023   LDL Calculated 119 (H) 07/28/2020   LDL Cholesterol, Calculated 126 03/12/2014   LDL Cholesterol, Calculated 122 02/01/2013   LDL Cholesterol, Calculated 177 03/21/2012   Creatinine 1.22 (H) 04/12/2023   Creatinine 1.14 03/12/2014   BUN 23 04/12/2023   BUN 16 03/12/2014   Potassium 4.2 04/12/2023   Potassium 4.4 03/12/2014   Magnesium 2.2 07/29/2020   WBC 6.1 07/29/2020   WBC 5.7 03/12/2014   HGB 14.2 07/29/2020   HGB 13.5 03/12/2014   HCT 41.9 07/29/2020   HCT 40.1 (L) 03/12/2014   Platelet 317 07/29/2020   Platelet 317 03/12/2014   INR 1.03 07/29/2020   INR 1.02 07/28/2020

## 2023-09-07 NOTE — Progress Notes (Signed)
 REFERRING PROVIDER:  Therisa Almarie Cleveland  ASSESSMENT: Roy [name] is a 52 y.o. male with microscopic hematuria.  The 2020 AUA guidelines for classification and work-up for microscopic hematuria were discussed in detail as well as causes of hematuria.    The patient has a formal urinalysis that demonstrates >=3 RBC/HPF, and therefore meets the definition of microscopic hematuria.   Based on the AUA microhematuria risk stratification system below, this patient is considered to be high risk.     For high risk patients, recommendation includes cystoscopy with axial upper tract imaging. For patients with no contraindications to its use, CT urogram is recommended. If contraindications exist, MR urography is the next best option. If contraindications exist for both CTU and MRU, then retrograde urogram with non-contrast axial imaging or renal ultrasound is recommended.   We did discuss other contributing factors in the workup of microscopic hematuria, including family history of RCC (which necessitates upper tract imaging regardless of risk category), and the use of cytology (which is not recommended for workup of initial microscopic hematuria but could be considered for persistent AMH after a negative workup or irritative voiding symptoms suggestive of possible CIS).   Finally, we discussed follow-up if the workup described above is negative. A repeat formal urinalysis maybe obtained within 12 months. If the subsequent urinalysis is negative (and the initial workup was negative), evaluation for microscopic hematuria may be discontinued. For those with persistent microscopic hematuria, shared decision-making will be needed to decide upon additional microscopic hematuria evaluation. Should the patient ever develop gross hematuria following a negative workup for microscopic hematuria, he was counseled to return to our clinic for evaluation.   All questions were answered  today.  PLAN: Cystoscopy without cytology and CT urogram PSA 0.5   It was a pleasure seeing this gentleman in my clinic today.  I personally spent 30 minutes face-to-face and non-face-to-face in the care of this patient, which includes all pre, intra, and post visit time on the date of service.  REASON FOR VISIT: microscopic hematuria.  HISTORY OF PRESENT ILLNESS:  Roy [name] is a pleasant 52 y.o. 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 Since that time, the hematuria has not resolved.  30 rbc/hpf  No family hx of GU cancers. Smoking history of ~25 pack years, currently not smoking cigarettes but vaping. Has hx of kidney stones in the past with ureteroscopy to remove them. Denies any current stone symptoms.   All outside records personally reviewed.  PAST MEDICAL HISTORY:  Past Medical History:  Diagnosis Date  . Arthritis   . CAD (coronary artery disease)   . Cardiomyopathy (CMS-HCC)    has improved  . CHF (congestive heart failure) (CMS-HCC)    EF has normalized  . Chronic back pain    herniated disc L5  . ETOH abuse   . Hyperlipidemia   . Hypertension   . Illicit drug use    past history of cocaine and marijuana use  . Kidney stones   . MI (myocardial infarction) (CMS-HCC)   . Obesity (BMI 30-39.9)   . Smoker     PAST SURGICAL HISTORY:  Past Surgical History:  Procedure Laterality Date  . ARTERIAL ANEURYSM REPAIR     Cerebral coil placement at [facility]  . CARDIAC CATHETERIZATION  2010  .  THORACENTESIS Right    plural effusion  . URETEROSCOPY     with laser lithotripsy and ureteral stent placement    FAMILY HISTORY:  No family history on file.  SOCIAL HISTORY:   reports that he has been smoking cigarettes. He has a 20 pack-year smoking  history. He has never used smokeless tobacco. He reports current alcohol use of about 3.0 standard drinks of alcohol per week. No history on file for drug use.  MEDICATIONS:  Current Outpatient Medications  Medication Sig Dispense Refill  . atorvastatin  (LIPITOR) 40 MG tablet Take 1 tablet (40 mg total) by mouth daily.    . budesonide -formoteroL  (SYMBICORT ) 160-4.5 mcg/actuation inhaler Symbicort  160 mcg-4.5 mcg/actuation HFA aerosol inhaler    . carvediloL  (COREG ) 6.25 MG tablet Take 1 tablet (6.25 mg total) by mouth two (2) times a day.    . chlorthalidone (HYGROTON) 25 MG tablet     . hydrOXYzine  (ATARAX ) 25 MG tablet Take 1 tablet (25 mg total) by mouth every six (6) hours as needed.    . LIDODERM  5 % patch     . losartan (COZAAR) 50 MG tablet Take 1 tablet (50 mg total) by mouth daily. 90 tablet 3  . PROAIR  HFA 90 mcg/actuation inhaler     . sildenafiL (VIAGRA) 100 MG tablet sildenafil 100 mg tablet    . tiotropium (SPIRIVA WITH HANDIHALER) 18 mcg inhalation capsule      No current facility-administered medications for this visit.    ALLERGIES:   No Known Allergies  REVIEW OF SYSTEMS: Negative upon 10 system review other than what is mentioned in the HPI.  PHYSICAL EXAMINATION:  GENERAL: Pleasant male in no acute distress.  VITAL SIGNS: Blood pressure 121/78, pulse 76, weight (!) 130.5 kg (287 lb 9.6 oz). PULMONARY: Normal work of breathing, no use of accessory muscles PSYCHOLOGIC: Normal affect, normal mood  LABS: Results for orders placed or performed in visit on 04/12/23  Basic Metabolic Panel  Result Value Ref Range   Sodium 140 135 - 145 mmol/L   Potassium 4.2 3.4 - 4.8 mmol/L   Chloride 107 98 - 107 mmol/L   CO2 27.0 20.0 - 31.0 mmol/L   Anion Gap 6 5 - 14 mmol/L   BUN 23 9 - 23 mg/dL   Creatinine 8.77 (H) 9.26 - 1.18 mg/dL   BUN/Creatinine Ratio 19    eGFR CKD-EPI (2021) Male 71 >=60 mL/min/1.12m2   Glucose 108 70 - 179 mg/dL   Calcium  10.0 8.7 - 10.4 mg/dL

## 2023-09-22 NOTE — Progress Notes (Signed)
 Procedure:  Local cystourethroscopy  Code: Cystoscopy, CPT 52000    Indication: 52 y.o. male here for microscopic hematuria. Reports history of gross hematuria (pink urine) at visit today.  Description:  Time out was performed immediately prior to the procedure.   The patient was prepped and draped in the usual sterile fashion in the supine position. Flexible cystoscopy was performed.  The urethral meatus and urethra were normal.  The prostate was normal sized. There wasn't intravesical protrusion. The bladder had normal urothelium. There was no trabeculation. Ureteral orifices were visualized bilaterally.  Voided urine sent for cytology. The patient tolerated the procedure well, and he did not require prophylactic antibiotics according to AUA guidelines.  Specimen: none  Assessment: Normal cystoscopy  Plan:   we will review cytology when complete and we will review upper tract imaging radiology report. CT urogram not yet read at time of visit; ordering provider to review when read.

## 2023-09-24 NOTE — Telephone Encounter (Signed)
 Copied from CRM #[id number]. Topic: Access To Clinicians - Req Clinic Call Back >> Sep 24, 2023  1:54 PM Randine Specking wrote: The patient is requesting for the Nurse to contact them in regards to Ct results that Dr. Avram order. Please contact The patient by Cell Phone [phone]

## 2023-09-27 NOTE — Telephone Encounter (Signed)
 Phone call and spoke with pt. I read and explained to him the note from [facility] and he said he had read it but did not completely understand it. I informed him to stay hydrated, watch for visible blood in his urine and to follow up in 1 year with his PCP unless he needed our help sooner. I explained that the stones he has are not causing any occlusion or swelling and this is good. He was appreciative of call back.

## 2023-10-18 NOTE — Progress Notes (Signed)
 Encounter Provider: Charmaine JINNY Kief, PA Date of Service: 10/18/2023  Primary Care Physician: Rollene Therisa Norris, FNP   ASSESSMENT: Roy [name] is a 52 y.o. male with the following visit diagnoses:   ICD-10-CM  1. Primary osteoarthritis of left knee  M17.12  2. Complex tear of medial meniscus of left knee as current injury, sequela  S83.232S    PLAN:  - Ice affected area for 15-20 minutes every hour as needed - Patient will use OTC medication as needed for swelling/pain control - Patient consented to a left knee corticosteroid injection today as described below. - We will obtain authorization for viscosupplementation injection and advised of process for obtaining authorization.  - All relevant prior imaging was independently interpreted and reviewed by me prior to today's exam. - I reviewed the diagnosis and discussed treatment options with the patient. The patient is amenable to the above plan. The patient was instructed to call or return to clinic if symptoms fail to improve as expected, there is any increasing pain, or any other concerns.  Requested Prescriptions    No prescriptions requested or ordered in this encounter    Request for Hyaluronic Acid injection approval for our patient with Osteoarthritis  -Physical Exam:  Crepitus: yes   Bony Enlargement.no   Tenderness. yes Morning stiffness.  yes    Oral Medications:  Patient has tried multiple oral medications including tylenol  and NSAIDS with diminishing response  -Physical Therapy:  Patient has tried a minimum of 3 months of home and structured physical therapy  Xrays:  Xray of the knee demonstrates Joint space narrowing:yes  spurring:no  ostephyptes:no  subchondral cysts: no   Prior Steroid Injection:yes  Has the patient had prior HA:  no   If yes did the patient use less OTC meds n/a.  Have improved function n/a  Have a decrease in pain n/a  There is no plan for TKA in the next 6  months.   SUBJECTIVE:  Chief complaint: Left knee pain   History of Present Illness:  Roy [name] is a 52 y.o. male who presents for follow up of left knee pain. He is s/p left knee corticosteroid injection on 07/16/23. He reports the injection provided significant relief and began to wear off approximately one week ago. He would like a repeat corticosteroid injection today. He has been taking 1200mg  Tylenol  twice daily as needed for pain. He has a compression sleeve that he uses on occasion.   Review of Systems Pertinent positives and negatives are documented in the HPI. All other systems reviewed are negative.  Medical History Past Medical History:  Diagnosis Date  . Arthritis   . CAD (coronary artery disease)   . Cardiomyopathy (CMS-HCC)    has improved  . CHF (congestive heart failure) (CMS-HCC)    EF has normalized  . Chronic back pain    herniated disc L5  . ETOH abuse   . Hyperlipidemia   . Hypertension   . Illicit drug use    past history of cocaine and marijuana use  . Kidney stones   . MI (myocardial infarction) (CMS-HCC)   . Obesity (BMI 30-39.9)   . Smoker     Surgical History Past Surgical History:  Procedure Laterality Date  . ARTERIAL ANEURYSM REPAIR     Cerebral coil placement at [facility]  . CARDIAC CATHETERIZATION  2010  . THORACENTESIS Right    plural effusion  . URETEROSCOPY     with laser lithotripsy and ureteral stent placement  Allergies Patient has no known allergies.  Medications He has a current medication list which includes the following prescription(s): atorvastatin , budesonide -formoterol , carvedilol , chlorthalidone, hydroxyzine , lidoderm , losartan, proair  hfa, sildenafil, and tiotropium.  Family History His family history is not on file.  Social History He reports that he has been smoking cigarettes. He has a 20 pack-year smoking history. He has never used smokeless tobacco. He reports current alcohol use of about 3.0 standard drinks of alcohol  per week.Home [address] [place] [address]     Occupational History  . Not on file   Social History   Socioeconomic History  . Marital status: Single  Tobacco Use  . Smoking status: Every Day    Current packs/day: 1.00    Average packs/day: 1 pack/day for 20.0 years (20.0 ttl pk-yrs)    Types: Cigarettes  . Smokeless tobacco: Never  . Tobacco comments:    pt smokes 1ppd, pt expressed desire to quit   Substance and Sexual Activity  . Alcohol use: Yes    Alcohol/week: 3.0 standard drinks of alcohol    Types: 3 Shots of liquor per week   Social Determinants of Health   Financial Resource Strain: Low Risk  (07/29/2020)   Overall Financial Resource Strain (CARDIA)   . Difficulty of Paying Living Expenses: Not very hard  Food Insecurity: No Food Insecurity (07/29/2020)   Hunger Vital Sign   . Worried About Programme researcher, broadcasting/film/video in the Last Year: Never true   . Ran Out of Food in the Last Year: Never true  Transportation Needs: No Transportation Needs (07/29/2020)   PRAPARE - Transportation   . Lack of Transportation (Medical): No   . Lack of Transportation (Non-Medical): No     OBJECTIVE: DETAILED PHYSICAL EXAM (12 Point) General Appearance well-nourished, in no acute distress.  Mood and Affect alert, cooperative and pleasant.  Pulmonary No labored breathing or shortness of breath  Cardiovascular well-perfused distally and no edema.  Lymphatics No lymphadenopathy  Sensation sensation to light touch distally: Normal  MUSCULOSKELETAL   Left Knee Inspection/palpation Range of motion Stability Strength Skin Inspection: Mild effusion. No erythema or deformity. Palpation: TTP medial and lateral joint line Patellofemoral crepitus  Range of motion: 0-130 Strength:  5/5 Special Tests:  Pain with McMurray Negative Lachman's test Negative Posterior Drawer test Stable to varus and valgus stress Skin:  Warm, dry, and intact   Test Results  Imaging: None  performed today.  Procedure Injection: After discussing the risks and benefits of corticosteroid injection, informed consent was obtained and a timeout was performed. The patient's left knee was prepped using chloraprep. Topical anesthesia was obtained using ethyl chloride spray, followed by injection using 1ml of 40 mg/mL of Kenalog and 4ml of 1% lidocaine  through the anterolateral portal site.  The patient tolerated the injection well and understands what to expect over the next several days.   MEDICAL DECISION MAKING (level of service defined by 2/3 elements)   Number/Complexity of Problems Addressed 1 acute, uncomplicated illness or injury (99203/99213)  Amount/Complexity of Data to be Reviewed/Analyzed 2 points: Review prior notes (1 point per unique source); Review test results (1 point per unique test); Order tests (1 point per unique test) (99203/99213)  Risk of Complications/Morbidity/Mortality of Management Over-the-counter Medications (99203/99213)

## 2023-10-18 NOTE — Progress Notes (Signed)
 Cardiology Note  Requesting Provider: Heyward Manus Dawn,*  Primary Provider: Rollene Therisa Norris, FNP   Assessment & Plan:  HTN - Blood pressure well-controlled.  -Continue Losartan to 50 mg, Chlorthalidone 25 mg once daily and carvedilol  6.25 mg twice daily - Encouraged increase in physical activity  - Continue dietary efforts    HLD - Lipid panel on 02/25/23- LDL was 82 and Triglycerides was 340. His repeat lipid panel on 07/23/23- LDL was 54 and Triglycerides was 660. His triglycerides are elevated most likely due to diet and alcohol use.  A1c was 6.0%.  - Continue atorvastatin  40 mg - Discussed limiting alcohol intake and sugar intake at length today.  - Had fasting triglycerides drawn this morning at PCP office, he will let me know the results. Can add Vascepa depending on results.   Lab Results  Component Value Date   LDL 82 02/25/2023   Cardiomyopathy, nonischemic with recovered LV function  Likely due to cocaine and alcohol use with previous EF of 45%. Most recent EF is 55-60% and largely resolved. Encouraged continued abstention of drug use. Currently on losartan 50 mg.  He is also on carvedilol  6.25 mg twice daily.  He does not need diuretic  Smoking Cessation  - He has quit smoking cigarettes with the help of bupropion and nicotine  patches. He currently uses a low nicotine  vape.   Return in about 6 months (around 04/16/2024).  Future Appointments  Date Time Provider Department Center  04/19/2024  8:45 AM Lewis, Julie MacMillan, ANP [facility]   I personally spent 30 minutes face-to-face and non-face-to-face in the care of this patient, which includes all pre, intra, and post visit time on the date of service.  All documented time was specific to the E/M visit and does not include any procedures that may have been performed.   History of Present Illness:  Roy [name] is a 52 y.o. male with a history of hypertension, hyperlipidemia,  bradycardia, prediabetes, mild nonflow limiting coronary artery disease, cerebral aneurysm post coil, hematuria, history of polysubstance abuse, right-sided pleural effusion status post thoracentesis. He presents today for blood pressure follow-up. He reports compliance with his blood pressure medications. He does not report headaches, blurry vision, dizziness, chest pain, SOB or palpitations. He does not currently engage in exercise. He has stopped smoking cigarettes with the help of bupropion and nicotine  patches. He does report use of a low nicotine  vape. He has cut back on his alcohol consumption. He notes he drinks about 2-3 per week and occasionally has 4 shots at a time. He denies current illicit drug use.   Had labs this morning with PCP.  Following up on BP and diet.  Has lost 9 pounds.   Cardiovascular History: 1. Hypertension  2. Hyperlipidemia. 3. Minimal coronary artery disease for a cardiac catheterization performed in 2010 w/ hx of acute MI 4. Cardiomyopathy, likely due to cocaine and alcohol use with previous EF of 45%. He is currently normalized for echo. 5. History of polysubstance abuse including tobacco, alcohol,marijuana and cocaine. Currently, he still uses tobacco and marijuana. 6. Cerebral aneurysm status post coil at [facility]. 7. Right-sided pleural effusion s/p thoracentesis (08/28/2018) 8. Bradycardia  Interventions / Surgery:  Imaging: Echocardiogram 02/25/2023 Summary   1. The left ventricle is normal in size with mildly increased wall thickness.   2. The left ventricular systolic function is normal, LVEF is visually estimated at > 55%.   3. There is grade I diastolic dysfunction (impaired relaxation).  4. The right ventricle is normal in size, with normal systolic function.  Number  Echo 08/09/20  1. The left ventricle is normal in size with normal wall thickness.   2. The left ventricular systolic function is normal, LVEF is visually estimated at 55-60%.   3. The  right ventricle is normal in size, with normal systolic function.   4. The left atrium is moderately dilated in size.   5. The right atrium is mildly dilated  in size.   6. There is mild mitral valve regurgitation.   7. Pulmonary systolic pressure cannot be estimated due to insufficient TR test results Starlet that showed jet.  Chest CT 02/27/20 1. Signs of pleural and parenchymal scarring in the right chest.  2. The juxta of diaphragmatic pulmonary nodule in the right chest  has resolved.  3. No acute cardiopulmonary process demonstrated.  4. Mild cardiomegaly with minimal calcified coronary artery disease.   Past Medical & Surgical History: Past Medical History:  Diagnosis Date  . Arthritis   . CAD (coronary artery disease)   . Cardiomyopathy (CMS-HCC)    has improved  . CHF (congestive heart failure) (CMS-HCC)    EF has normalized  . Chronic back pain    herniated disc L5  . ETOH abuse   . Hyperlipidemia   . Hypertension   . Illicit drug use    past history of cocaine and marijuana use  . Kidney stones   . MI (myocardial infarction) (CMS-HCC)   . Obesity (BMI 30-39.9)   . Smoker      Allergies: No Known Allergies   Current Medications:  Current Outpatient Medications:  .  atorvastatin  (LIPITOR) 40 MG tablet, Take 1 tablet (40 mg total) by mouth daily., Disp: , Rfl:  .  budesonide -formoteroL  (SYMBICORT ) 160-4.5 mcg/actuation inhaler, Inhale 2 puffs two (2) times a day as needed., Disp: , Rfl:  .  carvedilol  (COREG ) 6.25 MG tablet, Take 1 tablet (6.25 mg total) by mouth two (2) times a day., Disp: 180 tablet, Rfl: 3 .  chlorthalidone (HYGROTON) 25 MG tablet, Take 1 tablet (25 mg total) by mouth every morning., Disp: , Rfl:  .  hydrOXYzine  (ATARAX ) 25 MG tablet, Take 1 tablet (25 mg total) by mouth every six (6) hours as needed. prn, Disp: , Rfl:  .  LIDODERM  5 % patch, prn, Disp: , Rfl:  .  losartan (COZAAR) 50 MG tablet, Take 1 tablet (50 mg total) by mouth daily.,  Disp: 90 tablet, Rfl: 3 .  PROAIR  HFA 90 mcg/actuation inhaler, Inhale 2 puffs every six (6) hours as needed., Disp: , Rfl:  .  sildenafiL (VIAGRA) 100 MG tablet, prn, Disp: , Rfl:  .  tiotropium (SPIRIVA WITH HANDIHALER) 18 mcg inhalation capsule, Place 1 capsule (18 mcg total) into inhaler and inhale in the morning., Disp: , Rfl:  No current facility-administered medications for this visit.  Social History: Social History   Tobacco Use  . Smoking status: Every Day    Current packs/day: 1.00    Average packs/day: 1 pack/day for 20.0 years (20.0 ttl pk-yrs)    Types: Cigarettes  . Smokeless tobacco: Never  . Tobacco comments:    pt smokes 1ppd, pt expressed desire to quit   Substance Use Topics  . Alcohol use: Yes    Alcohol/week: 3.0 standard drinks of alcohol    Types: 3 Shots of liquor per week  He has no history on file for drug use.  Family History: His family history is  not on file.  Review of Systems: Review of ten systems is negative or unremarkable except as stated above.  Physical Exam: VITAL SIGNS: BP 116/63 (BP Site: L Arm, BP Position: Sitting, BP Cuff Size: Medium)   Pulse 85   Ht 190.5 cm (6' 3)   Wt (!) 128.8 kg (284 lb)   SpO2 96%   BMI 35.50 kg/m  GENERAL: NAD. HEENT: Normocephalic and atraumatic. Conjunctivae and sclerae clear and anicteric. No xanthelasma.  CARDIOVASCULAR: RRR. Normal S1 and S2. No murmurs, rubs, or gallops.  RESPIRATORY: Normal respiratory effort without use of accessory muscles. Clear to auscultation bilaterally. ABDOMEN: Soft, not tender or distended. No abdominal bruit EXTREMITIES:  No pretibial or ankle edema. Ambulatory ability satisfactory. SKIN: No rashes, ecchymosis or petechiae. NEUROLOGIC: Appropriate mood and affect. Alert and oriented to person, place, and time. No gross motor or sensory deficits evident.  Wt Readings from Last 12 Encounters:  10/18/23 (!) 128.8 kg (284 lb)  09/07/23 (!) 130.5 kg (287 lb 9.6 oz)   07/26/23 (!) 132.3 kg (291 lb 9.6 oz)  04/15/23 (!) 134.7 kg (297 lb)  04/12/23 (!) 133.8 kg (295 lb)  03/01/23 (!) 132.8 kg (292 lb 12.8 oz)  02/25/23 (!) 131.6 kg (290 lb 3.2 oz)  01/05/23 (!) 127.5 kg (281 lb 1.6 oz)  10/20/22 (!) 130.9 kg (288 lb 8 oz)  01/20/22 (!) 135.6 kg (299 lb)  07/28/20 (!) 117.1 kg (258 lb 2.5 oz)  10/22/14 (!) 118.8 kg (262 lb)    Pertinent Laboratory Studies: Lab Results  Component Value Date   Triglycerides 340 (H) 02/25/2023   Triglycerides 135 07/28/2020   Triglycerides 265 (H) 03/12/2014   Triglycerides 173 (H) 02/01/2013   Triglycerides 243 (H) 03/21/2012   HDL 46 02/25/2023   HDL 32 (L) 07/28/2020   HDL 35 (L) 03/12/2014   HDL 40 02/01/2013   HDL 44 03/21/2012   Non-HDL Cholesterol 150 (H) 02/25/2023   Non-HDL Cholesterol 146 (H) 07/28/2020   Non HDL Chol.  179 03/12/2014   Non HDL Chol.  157 02/01/2013   Non HDL Chol.  226 03/21/2012   LDL Calculated 82 02/25/2023   LDL Calculated 119 (H) 07/28/2020   LDL Cholesterol, Calculated 126 03/12/2014   LDL Cholesterol, Calculated 122 02/01/2013   LDL Cholesterol, Calculated 177 03/21/2012   Creatinine 1.22 (H) 04/12/2023   Creatinine 1.14 03/12/2014   BUN 23 04/12/2023   BUN 16 03/12/2014   Potassium 4.2 04/12/2023   Potassium 4.4 03/12/2014   Magnesium 2.2 07/29/2020   WBC 6.1 07/29/2020   WBC 5.7 03/12/2014   HGB 14.2 07/29/2020   HGB 13.5 03/12/2014   HCT 41.9 07/29/2020   HCT 40.1 (L) 03/12/2014   Platelet 317 07/29/2020   Platelet 317 03/12/2014   INR 1.03 07/29/2020   INR 1.02 07/28/2020

## 2024-06-30 NOTE — Progress Notes (Signed)
 Encounter Provider: Charmaine Cy Kief, PA Date of Service: 06/30/2024  Primary Care Physician: Buren Rock Klinefelter, MD   ASSESSMENT: Roy [name] is a 53 y.o. male with the following visit diagnoses:   ICD-10-CM  1. Primary osteoarthritis of left knee  M17.12  2. Right hip pain  M25.551   PLAN:  - Home exercise program provided today  - Ice affected area for 15-20 minutes every hour as needed - Patient will use OTC medication as needed for swelling/pain control - Patient consented to a left knee and right trochanteric bursa injection today as described below. - Follow up with me in two months as scheduled for re-evaluation of right hip, left knee and bilateral shoulders - All relevant prior imaging was independently interpreted and reviewed by me prior to today's exam. - I reviewed the diagnosis and discussed treatment options with the patient. The patient is amenable to the above plan. The patient was instructed to call or return to clinic if symptoms fail to improve as expected, there is any increasing pain, or any other concerns.  Requested Prescriptions    No prescriptions requested or ordered in this encounter     SUBJECTIVE:  Chief complaint: Left knee pain, right hip pain   History of Present Illness:  Roy [name] is a 54 y.o. male with PMH as documented below who presents for follow up of left knee pain and for evaluation of right hip pain. He is s/p left knee corticosteroid injection on 10/18/23. He reports his knee pain has been well controlled following the injection and would like a repeat injection today. He reports he has also been experiencing pain over the lateral aspect of his right hip. He reports intermittent popping when he flexes his hip. 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 He denies any lower extremity weakness, numbness/tingling distally.   Pain Assessment: 0-10 0-10 Pain Scale: 4  Review of  Systems Pertinent positives and negatives are documented in the HPI. All other systems reviewed are negative.  Medical History Past Medical History[1]  Surgical History Past Surgical History[2]  Allergies Patient has no known allergies.  Medications He has a current medication list which includes the following prescription(s): atorvastatin , budesonide -formoterol , carvedilol , chlorthalidone, hydroxyzine , lidoderm , losartan, proair  hfa, sildenafil, and tiotropium.  Family History His family history is not on file.  Social History He reports that he has quit smoking. His smoking use included cigarettes. He has a 20 pack-year smoking history. He has never used smokeless tobacco. He reports current alcohol use of about 3.0 standard drinks of alcohol per week.Home [address] [place] [address]     Occupational History  . Not on file   Social History[3]   OBJECTIVE: DETAILED PHYSICAL EXAM (12 Point) General Appearance well-nourished, in no acute distress.  Mood and Affect alert, cooperative and pleasant.  Pulmonary No labored breathing or shortness of breath  Cardiovascular well-perfused distally and no edema.  Lymphatics No lymphadenopathy  Sensation sensation to light touch distally: Normal  MUSCULOSKELETAL   Right Hip Inspection/palpation Range of motion Stability Strength Skin Inspection/palpation: No swelling, erythema, deformity, atrophy or hypertrophy noted. Tender to palpation of right greater trochanter.  Range of motion: full range of motion. Strength: Normal muscle tone and strength with resistance exam. Skin: No laceration, abrasion, ecchymosis, or other skin abnormality. Mild discomfort with FABER, FADIR Negative log roll   Left Knee Inspection/palpation Range of motion Stability Strength Skin Inspection: No swelling, erythema, or  deformity. Palpation: TTP medial and lateral joint line Patellofemoral crepitus  Range of motion: 0-130 Strength:   5/5 Skin:  Warm, dry, and intact   Test Results  Imaging: None performed today.  Procedure Injection (Left Knee): After discussing the risks and benefits of corticosteroid injection, informed consent was obtained and a timeout was performed. The patient's left knee was prepped using chloraprep. Topical anesthesia was obtained using ethyl chloride spray, followed by injection using 1ml of 40 mg/mL of Kenalog and 4ml of 1% lidocaine  through the anterolateral portal site.  The patient tolerated the injection well and understands what to expect over the next several days.   Injection (Right Hip): After discussing the risks and benefits of corticosteroid injection, informed consent was obtained and a timeout was performed. The patient's right hip was prepped using chloraprep. Topical anesthesia was obtained using ethyl chloride spray, followed by injection using 1ml of 40 mg/mL of Kenalog and 4ml of 1% lidocaine  into the trochanteric bursa.  The patient tolerated the injection well and understands what to expect over the next several days.   MEDICAL DECISION MAKING (level of service defined by 2/3 elements)   Number/Complexity of Problems Addressed 1 acute, uncomplicated illness or injury (99203/99213)  Amount/Complexity of Data to be Reviewed/Analyzed 2 points: Review prior notes (1 point per unique source); Review test results (1 point per unique test); Order tests (1 point per unique test) (99203/99213)  Risk of Complications/Morbidity/Mortality of Management Over-the-counter Medications (99203/99213)        [1] Past Medical History: Diagnosis Date  . Arthritis   . CAD (coronary artery disease)   . Cardiomyopathy       has improved  . CHF (congestive heart failure)       EF has normalized  . Chronic back pain    herniated disc L5  . ETOH abuse   . Hyperlipidemia   . Hypertension   . Illicit drug use    past history of cocaine and marijuana use  . Kidney stones   . MI (myocardial  infarction)      . Obesity (BMI 30-39.9)   . Smoker   [2] Past Surgical History: Procedure Laterality Date  . ARTERIAL ANEURYSM REPAIR     Cerebral coil placement at [facility]  . CARDIAC CATHETERIZATION  2010  . THORACENTESIS Right    plural effusion  . URETEROSCOPY     with laser lithotripsy and ureteral stent placement  [3] Social History Socioeconomic History  . Marital status: Single    Spouse name: None  . Number of children: None  . Years of education: None  . Highest education level: None  Tobacco Use  . Smoking status: Former    Current packs/day: 1.00    Average packs/day: 1 pack/day for 20.0 years (20.0 ttl pk-yrs)    Types: Cigarettes  . Smokeless tobacco: Never  . Tobacco comments:    pt smokes 1ppd, pt expressed desire to quit   Substance and Sexual Activity  . Alcohol use: Yes    Alcohol/week: 3.0 standard drinks of alcohol    Types: 3 Shots of liquor per week   Social Drivers of Health   Financial Resource Strain: Low Risk  (07/29/2020)   Overall Financial Resource Strain (CARDIA)   . Difficulty of Paying Living Expenses: Not very hard  Food Insecurity: No Food Insecurity (07/29/2020)   Hunger Vital Sign   . Worried About Programme researcher, broadcasting/film/video in the Last Year: Never true   . Ran Out of  Food in the Last Year: Never true  Transportation Needs: No Transportation Needs (07/29/2020)   PRAPARE - Transportation   . Lack of Transportation (Medical): No   . Lack of Transportation (Non-Medical): No

## 2024-07-12 ENCOUNTER — Emergency Department (HOSPITAL_COMMUNITY)

## 2024-07-12 ENCOUNTER — Encounter (HOSPITAL_COMMUNITY): Payer: Self-pay

## 2024-07-12 ENCOUNTER — Inpatient Hospital Stay (HOSPITAL_COMMUNITY)
Admission: EM | Admit: 2024-07-12 | Discharge: 2024-07-17 | DRG: 853 | Disposition: A | Discharge status: Home / Self Care | Attending: Internal Medicine | Admitting: Internal Medicine

## 2024-07-12 ENCOUNTER — Other Ambulatory Visit: Payer: Self-pay

## 2024-07-12 DIAGNOSIS — A419 Sepsis, unspecified organism: Secondary | ICD-10-CM | POA: Diagnosis not present

## 2024-07-12 DIAGNOSIS — K119 Disease of salivary gland, unspecified: Secondary | ICD-10-CM

## 2024-07-12 DIAGNOSIS — N200 Calculus of kidney: Secondary | ICD-10-CM | POA: Diagnosis not present

## 2024-07-12 DIAGNOSIS — Z87442 Personal history of urinary calculi: Secondary | ICD-10-CM

## 2024-07-12 DIAGNOSIS — I959 Hypotension, unspecified: Secondary | ICD-10-CM

## 2024-07-12 DIAGNOSIS — I10 Essential (primary) hypertension: Secondary | ICD-10-CM | POA: Diagnosis present

## 2024-07-12 DIAGNOSIS — G44209 Tension-type headache, unspecified, not intractable: Secondary | ICD-10-CM | POA: Diagnosis not present

## 2024-07-12 DIAGNOSIS — R29700 NIHSS score 0: Secondary | ICD-10-CM | POA: Diagnosis present

## 2024-07-12 DIAGNOSIS — R200 Anesthesia of skin: Secondary | ICD-10-CM | POA: Diagnosis present

## 2024-07-12 DIAGNOSIS — I6340 Cerebral infarction due to embolism of unspecified cerebral artery: Secondary | ICD-10-CM

## 2024-07-12 DIAGNOSIS — N136 Pyonephrosis: Secondary | ICD-10-CM | POA: Diagnosis present

## 2024-07-12 DIAGNOSIS — R7303 Prediabetes: Secondary | ICD-10-CM | POA: Diagnosis present

## 2024-07-12 DIAGNOSIS — N3001 Acute cystitis with hematuria: Secondary | ICD-10-CM | POA: Diagnosis not present

## 2024-07-12 DIAGNOSIS — N179 Acute kidney failure, unspecified: Secondary | ICD-10-CM | POA: Diagnosis not present

## 2024-07-12 DIAGNOSIS — I63412 Cerebral infarction due to embolism of left middle cerebral artery: Secondary | ICD-10-CM | POA: Diagnosis present

## 2024-07-12 DIAGNOSIS — R6520 Severe sepsis without septic shock: Secondary | ICD-10-CM

## 2024-07-12 DIAGNOSIS — A4159 Other Gram-negative sepsis: Principal | ICD-10-CM | POA: Diagnosis present

## 2024-07-12 DIAGNOSIS — E785 Hyperlipidemia, unspecified: Secondary | ICD-10-CM | POA: Diagnosis present

## 2024-07-12 DIAGNOSIS — Z8679 Personal history of other diseases of the circulatory system: Secondary | ICD-10-CM

## 2024-07-12 DIAGNOSIS — F17210 Nicotine dependence, cigarettes, uncomplicated: Secondary | ICD-10-CM | POA: Diagnosis present

## 2024-07-12 DIAGNOSIS — Z801 Family history of malignant neoplasm of trachea, bronchus and lung: Secondary | ICD-10-CM

## 2024-07-12 DIAGNOSIS — I639 Cerebral infarction, unspecified: Secondary | ICD-10-CM | POA: Insufficient documentation

## 2024-07-12 DIAGNOSIS — Z79899 Other long term (current) drug therapy: Secondary | ICD-10-CM

## 2024-07-12 DIAGNOSIS — R55 Syncope and collapse: Secondary | ICD-10-CM | POA: Diagnosis present

## 2024-07-12 DIAGNOSIS — N390 Urinary tract infection, site not specified: Secondary | ICD-10-CM | POA: Diagnosis present

## 2024-07-12 DIAGNOSIS — Z716 Tobacco abuse counseling: Secondary | ICD-10-CM

## 2024-07-12 DIAGNOSIS — Z1619 Resistance to other specified beta lactam antibiotics: Secondary | ICD-10-CM | POA: Diagnosis present

## 2024-07-12 DIAGNOSIS — I2510 Atherosclerotic heart disease of native coronary artery without angina pectoris: Secondary | ICD-10-CM | POA: Diagnosis present

## 2024-07-12 DIAGNOSIS — K5900 Constipation, unspecified: Secondary | ICD-10-CM | POA: Diagnosis present

## 2024-07-12 DIAGNOSIS — D649 Anemia, unspecified: Secondary | ICD-10-CM | POA: Diagnosis present

## 2024-07-12 DIAGNOSIS — Z7982 Long term (current) use of aspirin: Secondary | ICD-10-CM

## 2024-07-12 DIAGNOSIS — Z808 Family history of malignant neoplasm of other organs or systems: Secondary | ICD-10-CM

## 2024-07-12 LAB — TROPONIN I (HIGH SENSITIVITY)
Troponin I (High Sensitivity): 17 ng/L (ref <18)
Troponin I (High Sensitivity): 11 ng/L (ref <18)

## 2024-07-12 LAB — URINALYSIS, W/ REFLEX TO CULTURE (INFECTION SUSPECTED)
Bilirubin Urine: NEGATIVE
Glucose, UA: NEGATIVE mg/dL
Ketones, ur: NEGATIVE mg/dL
Nitrite: NEGATIVE
Protein, ur: 30 mg/dL — AB
RBC / HPF: >50 RBC/hpf (ref 0–5)
Specific Gravity, Urine: 1.012 (ref 1.005–1.030)
WBC, UA: >50 WBC/hpf (ref 0–5)
pH: 5 (ref 5.0–8.0)

## 2024-07-12 LAB — LACTIC ACID, PLASMA
Lactic Acid, Venous: 2.1 mmol/L (ref 0.5–1.9)
Lactic Acid, Venous: 1.6 mmol/L (ref 0.5–1.9)

## 2024-07-12 LAB — CBG MONITORING, ED: Glucose-Capillary: 132 mg/dL — ABNORMAL HIGH (ref 70–99)

## 2024-07-12 LAB — COMPREHENSIVE METABOLIC PANEL WITH GFR
ALT: 18 U/L (ref 0–44)
AST: 24 U/L (ref 15–41)
Albumin: 3.4 g/dL — ABNORMAL LOW (ref 3.5–5.0)
Alkaline Phosphatase: 44 U/L (ref 38–126)
Anion gap: 11 (ref 5–15)
BUN: 35 mg/dL — ABNORMAL HIGH (ref 6–20)
CO2: 25 mmol/L (ref 22–32)
Calcium: 9.1 mg/dL (ref 8.9–10.3)
Chloride: 103 mmol/L (ref 98–111)
Creatinine, Ser: 2.42 mg/dL — ABNORMAL HIGH (ref 0.61–1.24)
GFR, Estimated: 31 mL/min — ABNORMAL LOW (ref >60)
Glucose, Bld: 115 mg/dL — ABNORMAL HIGH (ref 70–99)
Potassium: 4.4 mmol/L (ref 3.5–5.1)
Sodium: 139 mmol/L (ref 135–145)
Total Bilirubin: 1.2 mg/dL (ref 0.0–1.2)
Total Protein: 6.6 g/dL (ref 6.5–8.1)

## 2024-07-12 LAB — CBC WITH DIFFERENTIAL/PLATELET
Abs Immature Granulocytes: 0.08 K/uL — ABNORMAL HIGH (ref 0.00–0.07)
Basophils Absolute: 0 K/uL (ref 0.0–0.1)
Basophils Relative: 0 %
Eosinophils Absolute: 0.1 K/uL (ref 0.0–0.5)
Eosinophils Relative: 0 %
HCT: 40.2 % (ref 39.0–52.0)
Hemoglobin: 13.4 g/dL (ref 13.0–17.0)
Immature Granulocytes: 1 %
Lymphocytes Relative: 6 %
Lymphs Abs: 0.7 K/uL (ref 0.7–4.0)
MCH: 33.3 pg (ref 26.0–34.0)
MCHC: 33.3 g/dL (ref 30.0–36.0)
MCV: 99.8 fL (ref 80.0–100.0)
Monocytes Absolute: 0.3 K/uL (ref 0.1–1.0)
Monocytes Relative: 2 %
Neutro Abs: 11 K/uL — ABNORMAL HIGH (ref 1.7–7.7)
Neutrophils Relative %: 91 %
Platelets: 281 K/uL (ref 150–400)
RBC: 4.03 MIL/uL — ABNORMAL LOW (ref 4.22–5.81)
RDW: 13.3 % (ref 11.5–15.5)
WBC: 12.1 K/uL — ABNORMAL HIGH (ref 4.0–10.5)
nRBC: 0 % (ref 0.0–0.2)

## 2024-07-12 LAB — HIV ANTIBODY (ROUTINE TESTING W REFLEX): HIV Screen 4th Generation wRfx: NONREACTIVE

## 2024-07-12 MED ORDER — ENOXAPARIN SODIUM 40 MG/0.4ML IJ SOSY
40.0000 mg | PREFILLED_SYRINGE | INTRAMUSCULAR | Status: DC
  Administered 2024-07-14 – 2024-07-15 (×2): 40 mg via SUBCUTANEOUS
  Filled 2024-07-12 (×5): qty 0.4

## 2024-07-12 MED ORDER — LACTATED RINGERS IV SOLN: INTRAVENOUS | Status: DC

## 2024-07-12 MED ORDER — LACTATED RINGERS IV BOLUS
1000.0000 mL | Freq: Once | INTRAVENOUS | Status: AC
  Administered 2024-07-12: 1000 mL via INTRAVENOUS

## 2024-07-12 MED ORDER — SODIUM CHLORIDE 0.9 % IV SOLN
2.0000 g | Freq: Once | INTRAVENOUS | Status: AC
  Administered 2024-07-12: 2 g via INTRAVENOUS
  Filled 2024-07-12: qty 20

## 2024-07-12 MED ORDER — ACETAMINOPHEN 500 MG PO TABS
1000.0000 mg | ORAL_TABLET | Freq: Four times a day (QID) | ORAL | Status: DC | PRN
  Administered 2024-07-12 – 2024-07-13 (×4): 1000 mg via ORAL
  Filled 2024-07-12 (×4): qty 2

## 2024-07-12 MED ORDER — ATORVASTATIN CALCIUM 40 MG PO TABS
40.0000 mg | ORAL_TABLET | Freq: Every day | ORAL | Status: DC
  Administered 2024-07-12 – 2024-07-14 (×3): 40 mg via ORAL
  Filled 2024-07-12 (×3): qty 1

## 2024-07-12 MED ORDER — BUDESON-GLYCOPYRROL-FORMOTEROL 160-9-4.8 MCG/ACT IN AERO
2.0000 | INHALATION_SPRAY | Freq: Two times a day (BID) | RESPIRATORY_TRACT | Status: DC
  Administered 2024-07-12 – 2024-07-17 (×9): 2 via RESPIRATORY_TRACT
  Filled 2024-07-12: qty 5.9

## 2024-07-12 MED ORDER — ALBUTEROL SULFATE (2.5 MG/3ML) 0.083% IN NEBU: 3.0000 mL | INHALATION_SOLUTION | Freq: Four times a day (QID) | RESPIRATORY_TRACT | Status: DC | PRN

## 2024-07-12 MED ORDER — SODIUM CHLORIDE 0.9 % IV SOLN
2.0000 g | INTRAVENOUS | Status: DC
  Administered 2024-07-13: 2 g via INTRAVENOUS
  Filled 2024-07-12 (×2): qty 20

## 2024-07-12 NOTE — Plan of Care (Signed)

## 2024-07-12 NOTE — Progress Notes (Signed)
 Elink is following code sepsis.

## 2024-07-12 NOTE — ED Provider Notes (Signed)
 [place] [facility]ERGENC[facility] DEPARTMENT AT [facility] Provider Note   CSN: [id number] Arrival date & time: 07/12/24  9143     Patient presents with: Hypotension   Roy [name] is a 53 y.o. male.   Pt is a 52y/o male with hx of hypertension, hyperlipidemia, bradycardia, prediabetes, mild nonflow limiting coronary artery disease, cerebral aneurysm post coil, hematuria, history of polysubstance abuse, right-sided pleural effusion status post thoracentesis who is presenting today after a syncopal event.  Patient and his fiance give the story.  He over the last few days has been feeling great but around 4 AM this morning started feeling unwell had chills and some myalgias.  About 4 AM he took some Tylenol  and when he got up this morning he was feeling a little bit better but still was not feeling great.  He took his blood pressure medication and had got in his truck to go to work when he called his fiance stating that he could not feel his right arm to move the gearshift and he was feeling lightheaded.  She told him to pull off to the side of the road but he was able to drive to the hospital.  Upon arrival here patient complaining of feeling dizzy and appeared to be very pale and had a brief syncopal episode while sitting in a chair.  Patient is now awake and complains of very minimal headache on the left side reports that his arm is now starting to feel better but still feels a little tingly and reports just does not feel quite himself.  He denies any chest pain, shortness of breath, cough or abdominal pain.  He has noticed that his urine has been pink for a while but denies any dysuria.  No recent medication changes but he does take losartan, carvedilol  and chlorthalidone all which he had this morning.  Reports he has been eating and drinking normally.  Last echo done in 2019 showed an EF of 70%.  The history is provided by the patient, a significant other and medical records.       Prior  to Admission medications   Medication Sig Start Date End Date Taking? Authorizing Provider  acetaminophen  (TYLENOL ) 500 MG tablet Take 2 tablets (1,000 mg total) by mouth every 6 (six) hours as needed for mild pain or fever. 09/07/18   Pearlean Manus, MD  albuterol  (VENTOLIN  HFA) 108 (90 Base) MCG/ACT inhaler Inhale 2 puffs into the lungs every 6 (six) hours as needed for wheezing or shortness of breath. 03/18/21   Kerrin Elspeth BROCKS, MD  carvedilol  (COREG ) 6.25 MG tablet Take 1 tablet (6.25 mg total) by mouth 2 (two) times daily with a meal. 03/18/21   Kerrin Elspeth BROCKS, MD  cyclobenzaprine  (FLEXERIL ) 10 MG tablet Take 1 tablet (10 mg total) by mouth 3 (three) times daily as needed for muscle spasms. 12/09/21   Jacolyn Pae, MD  dextromethorphan  15 MG/5ML syrup Take 10 mLs (30 mg total) by mouth 4 (four) times daily as needed for cough. 09/27/18   Kerrin Elspeth BROCKS, MD  guaiFENesin  (MUCINEX ) 600 MG 12 hr tablet Take 1 tablet (600 mg total) by mouth 2 (two) times daily. 09/07/18   Pearlean Manus, MD  hydrOXYzine  (ATARAX /VISTARIL ) 25 MG tablet Take 1 tablet (25 mg total) by mouth every 6 (six) hours as needed for anxiety. 09/07/18   Pearlean Manus, MD  lactobacillus acidophilus & bulgar (LACTINEX) chewable tablet Chew 1 tablet by mouth 3 (three) times daily with meals. 09/07/18  Pearlean Manus, MD  losartan (COZAAR) 50 MG tablet Take 100 mg by mouth daily. 07/29/20   [provider]  traZODone  (DESYREL ) 100 MG tablet Take 1 tablet (100 mg total) by mouth at bedtime as needed for sleep. As needed For sleep and anxiety 09/07/18   Pearlean Manus, MD    Allergies: Patient has no known allergies.    Review of Systems  Updated Vital Signs BP 106/61   Pulse 66   Temp (!) 97.3 F (36.3 C) (Oral)   Resp 14   Ht 6' 3 (1.905 m)   Wt 111.1 kg   SpO2 99%   BMI 30.62 kg/m   Physical Exam Vitals and nursing note reviewed.  Constitutional:      General: He is not in acute  distress.    Appearance: He is well-developed.  HENT:     Head: Normocephalic and atraumatic.  Eyes:     Conjunctiva/sclera: Conjunctivae normal.     Pupils: Pupils are equal, round, and reactive to light.  Cardiovascular:     Rate and Rhythm: Normal rate and regular rhythm.     Pulses: Normal pulses.     Heart sounds: No murmur heard.    Comments: Pulses present in the upper and lower extremities Pulmonary:     Effort: Pulmonary effort is normal. No respiratory distress.     Breath sounds: Normal breath sounds. No wheezing or rales.  Abdominal:     General: There is no distension.     Palpations: Abdomen is soft.     Tenderness: There is no abdominal tenderness. There is no guarding or rebound.  Musculoskeletal:        General: No tenderness. Normal range of motion.     Cervical back: Normal range of motion and neck supple.  Skin:    General: Skin is warm and dry.     Findings: No erythema or rash.  Neurological:     Mental Status: He is alert and oriented to person, place, and time.     Comments: No noted pronator drift in bilateral upper or lower extremities.  Strength is 5 out of 5 in all 4 extremities.  Vision is intact without any visual field deficits.  Extraocular movements are intact.  Patient is awake and alert and able to answer questions appropriately with normal speech  Psychiatric:        Behavior: Behavior normal.     (all labs ordered are listed, but only abnormal results are displayed) Labs Reviewed  CBC WITH DIFFERENTIAL/PLATELET - Abnormal; Notable for the following components:      Result Value   WBC 12.1 (*)    RBC 4.03 (*)    Neutro Abs 11.0 (*)    Abs Immature Granulocytes 0.08 (*)    All other components within normal limits  COMPREHENSIVE METABOLIC PANEL WITH GFR - Abnormal; Notable for the following components:   Glucose, Bld 115 (*)    BUN 35 (*)    Creatinine, Ser 2.42 (*)    Albumin 3.4 (*)    GFR, Estimated 31 (*)    All other components  within normal limits  URINALYSIS, W/ REFLEX TO CULTURE (INFECTION SUSPECTED) - Abnormal; Notable for the following components:   Color, Urine AMBER (*)    APPearance CLOUDY (*)    Hgb urine dipstick LARGE (*)    Protein, ur 30 (*)    Leukocytes,Ua MODERATE (*)    Bacteria, UA FEW (*)    All other components within normal  limits  LACTIC ACID, PLASMA - Abnormal; Notable for the following components:   Lactic Acid, Venous 2.1 (*)    All other components within normal limits  CBG MONITORING, ED - Abnormal; Notable for the following components:   Glucose-Capillary 132 (*)    All other components within normal limits  URINE CULTURE  CULTURE, BLOOD (SINGLE)  LACTIC ACID, PLASMA  TROPONIN I (HIGH SENSITIVITY)  TROPONIN I (HIGH SENSITIVITY)    EKG: EKG Interpretation Date/Time:  Wednesday July 12 2024 09:19:42 EDT Ventricular Rate:  67 PR Interval:  151 QRS Duration:  88 QT Interval:  379 QTC Calculation: 400 R Axis:   35  Text Interpretation: Sinus rhythm Repol abnrm suggests ischemia, inferior leads No significant change since last tracing since 09/02/2018 Confirmed by Doretha Folks ([id number]) on 07/12/2024 9:33:39 AM  Radiology: CT Renal Stone Study Result Date: 07/12/2024 CLINICAL DATA:  Abdominal/flank pain, stone suspected EXAM: CT ABDOMEN AND PELVIS WITHOUT CONTRAST TECHNIQUE: Multidetector CT imaging of the abdomen and pelvis was performed following the standard protocol without IV contrast. RADIATION DOSE REDUCTION: This exam was performed according to the departmental dose-optimization program which includes automated exposure control, adjustment of the mA and/or kV according to patient size and/or use of iterative reconstruction technique. COMPARISON:  None Available. FINDINGS: Lower chest: No acute abnormality. Hepatobiliary: Scattered subcentimeter focal hypodensities in the right and left hepatic lobes are too small to definitively characterize and may represent small cysts.  Gallbladder is unremarkable. No biliary dilatation. Pancreas: Unremarkable. No pancreatic ductal dilatation or surrounding inflammatory changes. Spleen: Normal in size without focal abnormality. Adrenals/Urinary Tract: Adrenal glands are unremarkable. Bilateral renal cysts with the largest measuring up to 2.1 cm arising from the superior pole of the right kidney. Nonobstructive 10 mm calculus in the inferior pole of the right kidney. Nonobstructive 5 mm calculus in the inferior pole of the left kidney. No ureteral calculi. No hydronephrosis. Bladder is unremarkable. Stomach/Bowel: Stomach is within normal limits. Appendix appears normal. No evidence of bowel wall thickening, distention, or inflammatory changes. Vascular/Lymphatic: Aortic atherosclerosis. Nonaneurysmal abdominal aorta. No enlarged abdominal or pelvic lymph nodes. Reproductive: Prostate is unremarkable. Other: Small fat containing paraumbilical hernia. No abdominopelvic ascites. No intraperitoneal free air. Musculoskeletal: No acute osseous abnormality. No suspicious osseous lesion. Degenerative disc height loss and endplate osteophytosis at L4-L5 and L5-S1. IMPRESSION: 1. Nonobstructive bilateral renal calculi measuring up to 10 mm on the right. No ureteral calculi. No hydronephrosis. 2.  Aortic Atherosclerosis (ICD10-I70.0). Electronically Signed   By: Harrietta Sherry M.D.   On: 07/12/2024 12:36   CT Head Wo Contrast Result Date: 07/12/2024 CLINICAL DATA:  54 year old male with headache and right side numbness. Dizziness, syncope. Hypotensive. EXAM: CT HEAD WITHOUT CONTRAST TECHNIQUE: Contiguous axial images were obtained from the base of the skull through the vertex without intravenous contrast. RADIATION DOSE REDUCTION: This exam was performed according to the departmental dose-optimization program which includes automated exposure control, adjustment of the mA and/or kV according to patient size and/or use of iterative reconstruction  technique. COMPARISON:  None Available. FINDINGS: Brain: Cerebral volume is normal for age. No midline shift, ventriculomegaly, mass effect, evidence of mass lesion, intracranial hemorrhage or evidence of cortically based acute infarction. Gray-white matter differentiation is within normal limits throughout the brain. Vascular: A distal left ICA siphon vascular stent is visible. No suspicious intracranial vascular hyperdensity. Skull: Intact.  No acute osseous abnormality identified. Sinuses/Orbits: Visualized paranasal sinuses and mastoids are well aerated. Tympanic cavities appear clear. Other: Mildly Disconjugate gaze. Negative  visible scalp soft tissues. Partially visible round 2.5 cm cystic appearing mass in the right parotid space, series 3, image 1. no regional inflammation. IMPRESSION: 1. Distal left ICA siphon vascular stent is visible. Normal for age noncontrast CT appearance of the Brain. 2. Partially visible 2.5 cm cystic appearing mass within the right parotid space. 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 Electronically Signed   By: VEAR Hurst M.D.   On: 07/12/2024 10:19   DG Chest Port 1 View Result Date: 07/12/2024 CLINICAL DATA:  Syncope, hypotension EXAM: PORTABLE CHEST 1 VIEW COMPARISON:  10/25/2018 and CT chest 03/18/2021 FINDINGS: Mid and lower thoracic spondylosis. Heart size within normal limits. Upper zone pulmonary vascular prominence may be due to supine positioning rather than pulmonary venous hypertension. Left costophrenic angle excluded. The included lungs appear clear. IMPRESSION: 1. Upper zone pulmonary vascular prominence may be due to supine positioning rather than pulmonary venous hypertension. 2. Mid and lower thoracic spondylosis. Electronically Signed   By: Ryan Salvage M.D.   On: 07/12/2024 09:48     Procedures   Medications Ordered in the ED  lactated ringers  infusion ( Intravenous New Bag/Given 07/12/24 1034)  cefTRIAXone  (ROCEPHIN ) 2 g in sodium chloride  0.9 % 100 mL  IVPB (has no administration in time range)  lactated ringers  bolus 1,000 mL (0 mLs Intravenous Stopped 07/12/24 1035)                                    Medical Decision Making Amount and/or Complexity of Data Reviewed Independent Historian: spouse External Data Reviewed: notes. Labs: ordered. Decision-making details documented in ED Course. Radiology: ordered and independent interpretation performed. Decision-making details documented in ED Course. ECG/medicine tests: ordered and independent interpretation performed. Decision-making details documented in ED Course.  Risk Prescription drug management. Decision regarding hospitalization.   Pt with multiple medical problems and comorbidities and presenting today with a complaint that caries a high risk for morbidity and mortality.  Here today with the above complaints.  Concern for syncope related to hypotension whether that is related to early infection and taking all of his blood pressure medications versus acute cardiac condition.  Low suspicion at this time for PE, ACS.  Also patient is complaining of some tingling in the left arm with a history of aneurysm in the past with coil.  Lower suspicion for stroke at this time think more perfusion related as it is improving as blood pressure is improving.  Concern for UTI, pyelonephritis.  Patient is not febrile at this time but did take Tylenol  about 4 hours before arriving.  Will give IV fluid bolus.  Labs and imaging are pending.  I independently interpreted patient's EKG today which does show ST depression inferiorly which is unchanged from 2019. 12:55 PM After 1 L BP 99/60.  Will continue LR at 125.  I have independently visualized and interpreted pt's images today.  CXR wnl.  Head CT without acute findings with coil in place.  Radiology report negative imaging overall.  But did see a partially visualized right parotid cyst which could be a salivary neoplasm and will need follow-up. On repeat  evaluation patient's blood pressure has remained stable in the low 100s.  Now when talking with the patient he reports that 3 days ago he had a severe pain in his left side that radiated around but had gone away but other than seeing pink urine he had noticed today he was  also having some dysuria. I independently interpreted patient's labs and CBC today with mild leukocytosis of 12, stable hemoglobin of 13, lactic acid mildly elevated at 2.1 and CMP today with normal electrolytes but a creatinine that is elevated at 2.42 which is up from his creatinine in April of this year of 1.2, troponin is normal.  Patient reports also on repeat evaluation that his right arm is back to normal and suspect that was related to hypoperfusion.  Will do a renal scan to ensure no evidence of obstructing stone as the cause for his new AKI.  12:55 PM Patient's urine shows concern for infection.  Renal scan with bilateral stones but no obstructing stones.  Patient was initiated as a code sepsis and was started on Rocephin .  Will admit for further care.  Patient and his fiance are comfortable with this plan.  Hospitalist was consulted for admission.  CRITICAL CARE Performed by: Rivky Clendenning Total critical care time: 40 minutes Critical care time was exclusive of separately billable procedures and treating other patients. Critical care was necessary to treat or prevent imminent or life-threatening deterioration. Critical care was time spent personally by me on the following activities: development of treatment plan with patient and/or surrogate as well as nursing, discussions with consultants, evaluation of patient's response to treatment, examination of patient, obtaining history from patient or surrogate, ordering and performing treatments and interventions, ordering and review of laboratory studies, ordering and review of radiographic studies, pulse oximetry and re-evaluation of patient's condition.       Final  diagnoses:  Acute cystitis with hematuria  Sepsis with acute renal failure without septic shock, due to unspecified organism, unspecified acute renal failure type (HCC)  Hypotension, unspecified hypotension type  AKI (acute kidney injury) [facility])    ED Discharge Orders     None          Doretha Folks, MD 07/12/24 1255

## 2024-07-12 NOTE — Hospital Course (Addendum)
 Roy [name] is a 53 y.o. person living with a history of HTN, HLD, bradycardia, nonobstructive coronary artery disease, cerebral aneurysm post coil, hematuria, and right-sided pleural effusion status post thoracentesis who presented with hypotension and admitted for sepsis.     Acute left cerebral infarcts, likely embolic MRI head without today showed scattered areas of restricted diffusion in the left cerebral hemisphere concerning for acute embolic infarcts.  Likely the cause of his acute but resolved right upper extremity deficits prior to admission. Patient remembered some slow speech when he had his right arm weakness before arrival to the hospital which also resolved.  No known atrial fibrillation and no signs of DVT here. MRA of the head with mild stenosis of the cavernous internal carotid bilaterally without flow-limiting stenosis and no change around left PCA coil. Neurology was consulted and recommended an embolic stroke workup. LDL elevated at 128. CTA Head and Neck showing no large vessel occlusion, hemodynamically significant stenosis, or aneurysm in the head or neck. He does have a mild stenosis of the left P1 and P2 segments. Echocardiogram shows EF 60-65% with no LVH, mild left atrial dilation, with a trivial mitral valve regurgitation but no active mitral or aortic stenosis. Bubble study negative for any evidence of intracranial shunt. US  LE Dopplers negative for DVT in bilateral legs. TEE scheduled for further assessment. Cardiology consulted at neurology's request to schedule TEE on 8/4. Patient was placed on Aspirin  81mg  and Plavix  75mg . Home atorvastatin  was increased to 80mg . Patient may need loop recorder and 30day monitor on discharge.   -- TEE ***    #Sepsis 2/2 Citrobacter UTI Bilateral nonobstructing renal stones Patient presented after an episode of right arm/hand numbness/weakness in the setting of several days of fatigue, malaise, chills, and increased urinary  frequency. On presentation to the ED, he was afebrile but hypotensive with initial BP noted at 85/53. Had a presyncopal vs syncopal event. Received IVF with improvement in BP to 99/57. BP improved to 132/77 on day of discharge. Labs notable for leukocytosis of 12.1, elevated Cr with suspected AKI, LA 2.1, and UA with hematuria, pyuria, and bacteruria. Imaging reviewed-- CT Renal study showed non-obstructive bilateral renal calculi measuring up to 10mm on the right with no ureteral calculi or hydronephrosis. CT head noting distal left ICA siphon stent. Started on IV Ceftriaxone  for suspected urinary sepsis. Urine culture positive for gram negative rods, identified as citrobacter freundii. This is part of the enterobacter family, and is susceptible to fluoroquinolones. Started patient on PO Levaquin  750mg  daily for 7 days. Given rapid improvement and non-obstructing stones, planned for outpatient urology follow up for nephrolithiasis management considering his history of kidney stones. IV fluids were held and encourage PO intake. Patient clinically improved and was discharged after embolic stroke workup and four doses of Levaquin  750mg  while inpatient.    #AKI versus CKD Creatinine elevated ton admission to 2.42. Improved to 1.41 on day of discharge. Baseline creatinine 0.85-0.87 from 2019. Most recent creatinine of 1.22 in 03/2023.  Patient did not seem volume depleted and had evidence of bilateral nonobstructive kidney stones, with the right stone being 10mm. Patient has history of kidney stones and gross hematuria. Initially concerned for obstructive kidney stones worsening his kidney function via hydronephrosis. Had a previous cystoscopy with [facility] and Dr. Lamarr Commander. Trended daily labs, showing overall improvement of kidney function.   #Hypertension Hypotension Patient on carvedilol  6.25 BID, losartan 50mg , chlorthalidone 25mg . On phentermine for weight treatment. Patient also on sildenafil and  pregabalin.  Patient states that he does not use sildenafil very often with no recent doses, but noted he took his pregabalin the night before his admission. This is unlikely to have caused his hypotensive episodes while hospitalized. Hypotensive episodes likely due to sepsis. His antihypertensives were held in his hypotensive setting and throughout his hospital course as he sustained low-to-normal blood pressures. He was encouraged to increase his PO fluid intake, and responded well to IV fluids.  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 There was suspicion for possible hypoperfusion causing right arm/hand sensory and motor changes in ED. IV fluids were given and improved the patient.This completely resolved and CT scan of the head without contrast did not show any signs of intracranial bleed. Patient noted a tension headache throughout his hospitalization that was unresponsive to Tylenol  alone. MRI/MRA Brain ordered to rule out any new or worsening neurodeficits. MR Angio Head showed no evidence of aneurysm or new coil movement in the left PCA, but some mild, non-flow limiting stenosis of the anterior [place] of the cavernous internal carotid arteries bilaterally. MRI Brain showed no change in the placement of his coil.  #Prediabetes Chronic, stable condition.  Last A1c 6.0 in 2021. Given concern for lowered kidney function, repeat A1c was performed to rule out diabetic causes of impaired function. New A1c on 5.8% on 7/31. Given stable, controlled new A1c, new kidney dysfunction was less likely attributed to a diabetic cause.  #Hyperlipidemia Chronic, stable condition. Last lipid panel 02/2023 shows total cholesterol 196, LDL 82, triglycerides 340. Continued home atorvastatin  40mg .   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 Discussed with patient to  follow up with ENT outpatient, but no signs of parotid gland dysfunction or dysphagia.

## 2024-07-12 NOTE — ED Triage Notes (Signed)
 Pt arrived pov with his wife for feeling dizzy. Staff report pt had a syncopal episode in the lobby while sitting in a chair. BP reported to be 70/30. Pt's brought to room on stretcher.

## 2024-07-12 NOTE — H&P (Cosign Needed)
 Date: 07/12/2024               Patient Name:  Roy [name] MRN: [id number]  DOB: 28-Jan-1971 Age / Sex: 53 y.o., male   PCP: Rollene Therisa BRAVO, FNP (Inactive)         Medical Service: Internal Medicine Teaching Service         Attending Physician: Dr. Ronnald Sergeant      First Contact: Lamonte Penning, DO}    Second Contact: Dr. Fairy Pool, DO          Pager Information: First Contact Pager: [phone]   Second Contact Pager: [phone]   SUBJECTIVE   Chief Complaint: Hypotension  History of Present Illness: Roy [name] is a 53 y.o. male with PMH of HTN, HLD, bradycardia, prediabetes, nonobstructive coronary artery disease, cerebral aneurysm post coil, hematuria, and right-sided pleural effusion status post thoracentesis who presented to the ED after a new numbness to the right arm. Since Saturday, the patient has been progressively feeling more fatigued and unwell. Notes an episode of sharp, left-sided abdominal pain as well as some urinary frequency. Started developing bad chills early this morning at 3-4am. He did not take his temperature then but did take Tylenol . States that he took all of his antihypertensives before going to work. He was supposed to work today and was getting ready to drive to [place] . He states he was placing paper into envelopes when he suddenly developed right-arm weakness and numbness. He lost his grip strength and became concerned when he realized he could not shift gears in his truck. He parked and came back to ED. In the ED, he had a syncopal episode where he slid out of his chair, and his heart rate dropped to the 50s. Never fully lost consciousness or hit his head. He was placed in a room, placed on fluid bolus, and resyncopized to 100s. Complained of minimal headache on the left side of his head. States he has been able to urinate okay and his urine looking pink today. Denies dysuria, chest pain, shortness of breath, cough, vision changes, or abdominal  pain.  No recent medication changes.   ED Course: Hypotensive on arrival 85/53.  Was given 1 L of LR for resuscitation.  Blood pressure slightly improved to 99/60.  EKG and chest x-ray done, both normal.  CBC showed leukocytosis 12.1 with left shift of 11.  CMP showed BUN 35, creatinine 2.42, albumin 3.4, GFR 31.  Baseline GFR above 60 as of 03/2024.  UA was positive for moderate leukocytes, protein 30, and large blood.  Pending urinary culture.  Troponins normal, 17 > 11. Lactic acid peaked 2.1 > 1.6.  Was empirically started on IV ceftriaxone .    Imaging:  EKG: shows sinus rhythm with abnormal repolarization in inferior leads CXR: showed upper zone pulmonary vascular priminence that may be due to supine positioning instead of pulmonary venous hypertension. Mid and lower thoracic spondylosis.  CT Head: Distal left ICA siphone vascular stent is visible, considered normal for age. 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   CT Renal Stone: nonobstructive bilateral renal calculi measuring up to 10mm on the right. No ureteral calculi. No hydronephrosis. Aortic atherosclerosis.   Meds:  Patient reported:  -Acetaminophen  500mg  Q6H  -Albuterol  inhaler -Carvedilol  6.25 BID  -Chlorthalidone 25mg  -Hydroxyzine  25mg  PRN  -Losartan 50mg   -Trazodone  100mg  nightly PRN -Pregabalin 75 mg twice daily as needed -Phentermine 30 mg daily -Sildenafil 100  mg as needed, no recent doses -Spiriva -Symbicort  No outpatient medications have been marked as taking for the 07/12/24 encounter [facility] Encounter).    Past Medical History Hypertension Hyperlipidemia Bradycardia Prediabetes Nonobstructive coronary artery disease  Cerebral aneurysm s/p coil  History of kidney stones  Past Surgical History Past Surgical History:  Procedure Laterality Date   ANEURYSM COILING     a few years ago   DECORTICATION Right 09/02/2018    Procedure: DECORTICATION;  Surgeon: Kerrin Elspeth BROCKS, MD;  Location: [facility] OR;  Service: Thoracic;  Laterality: Right;   PLEURAL EFFUSION DRAINAGE Right 09/02/2018   Procedure: DRAINAGE OF PLEURAL EFFUSION;  Surgeon: Kerrin Elspeth BROCKS, MD;  Location: [facility] OR;  Service: Thoracic;  Laterality: Right;   VIDEO ASSISTED THORACOSCOPY (VATS)/THOROCOTOMY Right 09/02/2018   Procedure: VIDEO ASSISTED THORACOSCOPY (VATS)/possible THOROCOTOMY;  Surgeon: Kerrin Elspeth BROCKS, MD;  Location: [facility] OR;  Service: Thoracic;  Laterality: Right;     Social:  Lives With: fiancee and daughter Occupation: truck driver Support: family  Level of Function: independent ADLs and IADLs PCP:  Rollene Therisa BRAVO, FNP (Inactive)  Substances: -Tobacco: previous user, 1.5 ppd x 28 years, quit June 2024 -Alcohol: social, 2-4 drinks a week -Recreational Drug: no IDVU, previously cocaine, stopped 13 years ago  Family History:  Family History  Problem Relation Age of Onset   Lung cancer Father    Throat cancer Father      Allergies: Allergies as of 07/12/2024   (No Known Allergies)    Review of Systems: A complete ROS was negative except as per HPI.   OBJECTIVE:   Physical Exam: Blood pressure 106/61, pulse 66, temperature (!) 97.3 F (36.3 C), temperature source Oral, resp. rate 14, height 6' 3 (1.905 m), weight 111.1 kg, SpO2 99%.  Constitutional: pleasant, well-appearing male laying in bed, in no acute distress HENT: normocephalic atraumatic, mucous membranes moist Eyes: conjunctiva non-erythematous Neck: supple Cardiovascular: regular rate and rhythm, no m/r/g Pulmonary/Chest: normal work of breathing on room air, lungs clear to auscultation bilaterally Abdominal: soft, non-tender, non-distended Neurological: alert & oriented x 3, 5/5 strength in bilateral upper and lower extremities, normal gait Skin: warm and dry Psych: mood and affect normal  Labs: CBC    Component Value Date/Time   WBC 12.1  (H) 07/12/2024 0925   RBC 4.03 (L) 07/12/2024 0925   HGB 13.4 07/12/2024 0925   HCT 40.2 07/12/2024 0925   PLT 281 07/12/2024 0925   MCV 99.8 07/12/2024 0925   MCH 33.3 07/12/2024 0925   MCHC 33.3 07/12/2024 0925   RDW 13.3 07/12/2024 0925   LYMPHSABS 0.7 07/12/2024 0925   MONOABS 0.3 07/12/2024 0925   EOSABS 0.1 07/12/2024 0925   BASOSABS 0.0 07/12/2024 0925     CMP     Component Value Date/Time   NA 139 07/12/2024 0925   K 4.4 07/12/2024 0925   CL 103 07/12/2024 0925   CO2 25 07/12/2024 0925   GLUCOSE 115 (H) 07/12/2024 0925   BUN 35 (H) 07/12/2024 0925   CREATININE 2.42 (H) 07/12/2024 0925   CALCIUM  9.1 07/12/2024 0925   PROT 6.6 07/12/2024 0925   ALBUMIN 3.4 (L) 07/12/2024 0925   AST 24 07/12/2024 0925   ALT 18 07/12/2024 0925   ALKPHOS 44 07/12/2024 0925   BILITOT 1.2 07/12/2024 0925   GFRNONAA 31 (L) 07/12/2024 0925   GFRAA >60 09/05/2018 0418    Imaging:  CT Renal Stone Study Result Date: 07/12/2024 CLINICAL DATA:  Abdominal/flank pain, stone suspected EXAM: CT  ABDOMEN AND PELVIS WITHOUT CONTRAST TECHNIQUE: Multidetector CT imaging of the abdomen and pelvis was performed following the standard protocol without IV contrast. RADIATION DOSE REDUCTION: This exam was performed according to the departmental dose-optimization program which includes automated exposure control, adjustment of the mA and/or kV according to patient size and/or use of iterative reconstruction technique. COMPARISON:  None Available. FINDINGS: Lower chest: No acute abnormality. Hepatobiliary: Scattered subcentimeter focal hypodensities in the right and left hepatic lobes are too small to definitively characterize and may represent small cysts. Gallbladder is unremarkable. No biliary dilatation. Pancreas: Unremarkable. No pancreatic ductal dilatation or surrounding inflammatory changes. Spleen: Normal in size without focal abnormality. Adrenals/Urinary Tract: Adrenal glands are unremarkable. Bilateral  renal cysts with the largest measuring up to 2.1 cm arising from the superior pole of the right kidney. Nonobstructive 10 mm calculus in the inferior pole of the right kidney. Nonobstructive 5 mm calculus in the inferior pole of the left kidney. No ureteral calculi. No hydronephrosis. Bladder is unremarkable. Stomach/Bowel: Stomach is within normal limits. Appendix appears normal. No evidence of bowel wall thickening, distention, or inflammatory changes. Vascular/Lymphatic: Aortic atherosclerosis. Nonaneurysmal abdominal aorta. No enlarged abdominal or pelvic lymph nodes. Reproductive: Prostate is unremarkable. Other: Small fat containing paraumbilical hernia. No abdominopelvic ascites. No intraperitoneal free air. Musculoskeletal: No acute osseous abnormality. No suspicious osseous lesion. Degenerative disc height loss and endplate osteophytosis at L4-L5 and L5-S1. IMPRESSION: 1. Nonobstructive bilateral renal calculi measuring up to 10 mm on the right. No ureteral calculi. No hydronephrosis. 2.  Aortic Atherosclerosis (ICD10-I70.0). Electronically Signed   By: Harrietta Sherry M.D.   On: 07/12/2024 12:36   CT Head Wo Contrast Result Date: 07/12/2024 CLINICAL DATA:  53 year old male with headache and right side numbness. Dizziness, syncope. Hypotensive. EXAM: CT HEAD WITHOUT CONTRAST TECHNIQUE: Contiguous axial images were obtained from the base of the skull through the vertex without intravenous contrast. RADIATION DOSE REDUCTION: This exam was performed according to the departmental dose-optimization program which includes automated exposure control, adjustment of the mA and/or kV according to patient size and/or use of iterative reconstruction technique. COMPARISON:  None Available. FINDINGS: Brain: Cerebral volume is normal for age. No midline shift, ventriculomegaly, mass effect, evidence of mass lesion, intracranial hemorrhage or evidence of cortically based acute infarction. Gray-white matter  differentiation is within normal limits throughout the brain. Vascular: A distal left ICA siphon vascular stent is visible. No suspicious intracranial vascular hyperdensity. Skull: Intact.  No acute osseous abnormality identified. Sinuses/Orbits: Visualized paranasal sinuses and mastoids are well aerated. Tympanic cavities appear clear. Other: Mildly Disconjugate gaze. Negative visible scalp soft tissues. Partially visible round 2.5 cm cystic appearing mass in the right parotid space, series 3, image 1. no regional inflammation. IMPRESSION: 1. Distal left ICA siphon vascular stent is visible. Normal for age noncontrast CT appearance of the Brain. 2. Partially visible 2.5 cm cystic appearing mass within the right parotid space. 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 Electronically Signed   By: VEAR Hurst M.D.   On: 07/12/2024 10:19   DG Chest Port 1 View Result Date: 07/12/2024 CLINICAL DATA:  Syncope, hypotension EXAM: PORTABLE CHEST 1 VIEW COMPARISON:  10/25/2018 and CT chest 03/18/2021 FINDINGS: Mid and lower thoracic spondylosis. Heart size within normal limits. Upper zone pulmonary vascular prominence may be due to supine positioning rather than pulmonary venous hypertension. Left costophrenic angle excluded. The included lungs appear clear. IMPRESSION: 1. Upper zone pulmonary vascular prominence may be due to supine positioning rather than pulmonary venous hypertension. 2. Mid  and lower thoracic spondylosis. Electronically Signed   By: Ryan Salvage M.D.   On: 07/12/2024 09:48     EKG: personally reviewed my interpretation is sinus rhythm, unchanged from prior EKG 08/2018   ASSESSMENT & PLAN:   Assessment & Plan by Problem: Principal Problem:   Sepsis (HCC)   Roy [name] is a 53 y.o. person living with a history of HTN, HLD, bradycardia, nonobstructive coronary artery disease, cerebral aneurysm post coil, hematuria, and right-sided pleural effusion status post thoracentesis who presented with  hypotension and admitted for sepsis.  #Sepsis UTI Patient hypotensive on admission to 85/53. Was given 1 L LR for resuscitation. BP improved to 99/57. BP now 132/77.  Leukocytosis of 12.1 with a left shift of 11. Patient afebrile, but endorsed chills that began this morning.  Has history of kidney stones and gross hematuria. Had a previous cystoscopy with [facility] and Dr. Lamarr Commander. CT Renal study showed obstructive bilateral renal calculi measuring up to 10mm on the right with no ureteral calculi or hydronephrosis. Was given Rocephin  in the ED for empiric coverage. UA positive for cystitis, still pending urine culture. If not improving, will consider urology consult for lithotripsy or other further management.  Especially if he is having worsening signs of infection then I would be worried about an infected renal calculus but I think this is unlikely due to his rapid improvement.   -EKG -Cardiac Monitoring -Continue IV ceftriaxone  2g daily -Blood Cultures pending  -CBC, BMP -PT/OT -Regular diet -Strict I&Os -Daily weights -Consider urology consult  #AKI versus CKD Creatinine elevated to 2.42 today.  Baseline 0.85-0.87 as of 2019 and most recent creatinine of 1.22 in 03/2023.  Patient does not seem volume depleted. Has evidence of bilateral nonobstructive kidney stones, with the right stone being 10mm. This is likely causing his insult. Patient noticed hematuria today, although he has a history of gross hematuria. -Daily BMP with GFR -Urine Protein/creatinine ratio  #Hypertension Hypotension Patient on carvedilol  6.25 BID, losartan 50mg , chlorthalidone 25mg , and phenteramine 30mg . Patient also on sildenafil and pregabalin.  Patient states that he does not use sildenafil very often with no recent doses, but notes he took his pregabalin last night, but this is unlikely to have caused his hypotensive episodes today. Hypotensive episode likely due to sepsis. Will hold his antihypertensives  in his hypotensive setting and slowly add back as he normalizes.   Prior cerebral aneurysm with coil placement Prior to arrival patient had right arm weakness and numbness that resolved with fluids.  Could be due to vulnerable area around the coil with global hypoperfusion causing similar effect as recrudescence. This is completely resolved and CT scan of the head without contrast does not show any signs of intracranial bleed. - Close monitoring for any new or worsening neurodeficits then would get MRI  #Prediabetes Chronic, stable. Last A1c 6.0 in 2021. Given new worsening kidney function, will repeat A1c to rule out diabetic causes of impaired function. -A1c  #Hyperlipidemia Chronic, stable. Last lipid panel 02/2023 shows total cholesterol 196, LDL 82, triglycerides 340.  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 Follow up with ENT outpatient but no sign of parotid gland dysfunction or dysphagia.  Best practice: Diet: Normal VTE: None IVF: LR,Bolus Code: Full  Disposition planning: Prior to Admission Living Arrangement: Home, living with fiancee and daughter Anticipated Discharge Location: Home  Dispo: Admit patient to Observation with expected length of stay less than 2  midnights.  Signed: Pratt Bress, DO Internal Medicine Resident, PGY-1 Please contact the on call pager at [phone] for any urgent or emergent needs. 8:17 PM 07/12/2024

## 2024-07-13 DIAGNOSIS — R200 Anesthesia of skin: Secondary | ICD-10-CM | POA: Diagnosis present

## 2024-07-13 DIAGNOSIS — I6389 Other cerebral infarction: Secondary | ICD-10-CM | POA: Diagnosis not present

## 2024-07-13 DIAGNOSIS — Z808 Family history of malignant neoplasm of other organs or systems: Secondary | ICD-10-CM | POA: Diagnosis not present

## 2024-07-13 DIAGNOSIS — N179 Acute kidney failure, unspecified: Secondary | ICD-10-CM

## 2024-07-13 DIAGNOSIS — E785 Hyperlipidemia, unspecified: Secondary | ICD-10-CM | POA: Diagnosis present

## 2024-07-13 DIAGNOSIS — I639 Cerebral infarction, unspecified: Secondary | ICD-10-CM | POA: Diagnosis not present

## 2024-07-13 DIAGNOSIS — F1210 Cannabis abuse, uncomplicated: Secondary | ICD-10-CM | POA: Diagnosis not present

## 2024-07-13 DIAGNOSIS — G44209 Tension-type headache, unspecified, not intractable: Secondary | ICD-10-CM | POA: Diagnosis not present

## 2024-07-13 DIAGNOSIS — Z716 Tobacco abuse counseling: Secondary | ICD-10-CM | POA: Diagnosis not present

## 2024-07-13 DIAGNOSIS — Z79899 Other long term (current) drug therapy: Secondary | ICD-10-CM | POA: Diagnosis not present

## 2024-07-13 DIAGNOSIS — I3139 Other pericardial effusion (noninflammatory): Secondary | ICD-10-CM | POA: Diagnosis not present

## 2024-07-13 DIAGNOSIS — N200 Calculus of kidney: Secondary | ICD-10-CM | POA: Diagnosis present

## 2024-07-13 DIAGNOSIS — D649 Anemia, unspecified: Secondary | ICD-10-CM | POA: Diagnosis present

## 2024-07-13 DIAGNOSIS — I2510 Atherosclerotic heart disease of native coronary artery without angina pectoris: Secondary | ICD-10-CM | POA: Diagnosis present

## 2024-07-13 DIAGNOSIS — A4159 Other Gram-negative sepsis: Secondary | ICD-10-CM | POA: Diagnosis present

## 2024-07-13 DIAGNOSIS — N3001 Acute cystitis with hematuria: Secondary | ICD-10-CM | POA: Diagnosis not present

## 2024-07-13 DIAGNOSIS — A419 Sepsis, unspecified organism: Secondary | ICD-10-CM | POA: Diagnosis not present

## 2024-07-13 DIAGNOSIS — F17210 Nicotine dependence, cigarettes, uncomplicated: Secondary | ICD-10-CM | POA: Diagnosis present

## 2024-07-13 DIAGNOSIS — Z7982 Long term (current) use of aspirin: Secondary | ICD-10-CM | POA: Diagnosis not present

## 2024-07-13 DIAGNOSIS — Z87891 Personal history of nicotine dependence: Secondary | ICD-10-CM | POA: Diagnosis not present

## 2024-07-13 DIAGNOSIS — I10 Essential (primary) hypertension: Secondary | ICD-10-CM | POA: Diagnosis present

## 2024-07-13 DIAGNOSIS — K5900 Constipation, unspecified: Secondary | ICD-10-CM | POA: Diagnosis present

## 2024-07-13 DIAGNOSIS — R6520 Severe sepsis without septic shock: Secondary | ICD-10-CM | POA: Diagnosis present

## 2024-07-13 DIAGNOSIS — Z1619 Resistance to other specified beta lactam antibiotics: Secondary | ICD-10-CM | POA: Diagnosis present

## 2024-07-13 DIAGNOSIS — I6340 Cerebral infarction due to embolism of unspecified cerebral artery: Secondary | ICD-10-CM | POA: Diagnosis not present

## 2024-07-13 DIAGNOSIS — R7303 Prediabetes: Secondary | ICD-10-CM | POA: Diagnosis present

## 2024-07-13 DIAGNOSIS — R29700 NIHSS score 0: Secondary | ICD-10-CM | POA: Diagnosis present

## 2024-07-13 DIAGNOSIS — Z801 Family history of malignant neoplasm of trachea, bronchus and lung: Secondary | ICD-10-CM | POA: Diagnosis not present

## 2024-07-13 DIAGNOSIS — I959 Hypotension, unspecified: Secondary | ICD-10-CM | POA: Diagnosis present

## 2024-07-13 DIAGNOSIS — N390 Urinary tract infection, site not specified: Secondary | ICD-10-CM | POA: Diagnosis present

## 2024-07-13 DIAGNOSIS — I63412 Cerebral infarction due to embolism of left middle cerebral artery: Secondary | ICD-10-CM | POA: Diagnosis present

## 2024-07-13 DIAGNOSIS — N136 Pyonephrosis: Secondary | ICD-10-CM | POA: Diagnosis present

## 2024-07-13 DIAGNOSIS — Z8679 Personal history of other diseases of the circulatory system: Secondary | ICD-10-CM

## 2024-07-13 DIAGNOSIS — R55 Syncope and collapse: Secondary | ICD-10-CM | POA: Diagnosis present

## 2024-07-13 LAB — HEMOGLOBIN A1C
Hgb A1c MFr Bld: 5.8 % — ABNORMAL HIGH (ref 4.8–5.6)
Mean Plasma Glucose: 119.76 mg/dL

## 2024-07-13 LAB — BASIC METABOLIC PANEL WITH GFR
Anion gap: 7 (ref 5–15)
BUN: 25 mg/dL — ABNORMAL HIGH (ref 6–20)
CO2: 27 mmol/L (ref 22–32)
Calcium: 8.6 mg/dL — ABNORMAL LOW (ref 8.9–10.3)
Chloride: 105 mmol/L (ref 98–111)
Creatinine, Ser: 1.4 mg/dL — ABNORMAL HIGH (ref 0.61–1.24)
GFR, Estimated: 60 mL/min — ABNORMAL LOW (ref >60)
Glucose, Bld: 124 mg/dL — ABNORMAL HIGH (ref 70–99)
Potassium: 3.9 mmol/L (ref 3.5–5.1)
Sodium: 139 mmol/L (ref 135–145)

## 2024-07-13 LAB — CBC
HCT: 29.1 % — ABNORMAL LOW (ref 39.0–52.0)
Hemoglobin: 9.6 g/dL — ABNORMAL LOW (ref 13.0–17.0)
MCH: 32.8 pg (ref 26.0–34.0)
MCHC: 33 g/dL (ref 30.0–36.0)
MCV: 99.3 fL (ref 80.0–100.0)
Platelets: 259 K/uL (ref 150–400)
RBC: 2.93 MIL/uL — ABNORMAL LOW (ref 4.22–5.81)
RDW: 13.5 % (ref 11.5–15.5)
WBC: 9.7 K/uL (ref 4.0–10.5)
nRBC: 0 % (ref 0.0–0.2)

## 2024-07-13 MED ORDER — POLYETHYLENE GLYCOL 3350 17 G PO PACK
17.0000 g | PACK | Freq: Every day | ORAL | Status: DC
  Administered 2024-07-13 – 2024-07-16 (×4): 17 g via ORAL
  Filled 2024-07-13 (×5): qty 1

## 2024-07-13 MED ORDER — PROCHLORPERAZINE MALEATE 5 MG PO TABS
5.0000 mg | ORAL_TABLET | Freq: Once | ORAL | Status: AC
  Administered 2024-07-13: 5 mg via ORAL
  Filled 2024-07-13: qty 1

## 2024-07-13 MED ORDER — SENNOSIDES-DOCUSATE SODIUM 8.6-50 MG PO TABS
1.0000 | ORAL_TABLET | Freq: Once | ORAL | Status: AC
  Administered 2024-07-13: 1 via ORAL
  Filled 2024-07-13 (×2): qty 1

## 2024-07-13 NOTE — Progress Notes (Signed)
 PT Cancellation Note  Patient Details Name: Roy [name] MRN: [id number] DOB: 1971-07-14   Cancelled Treatment:    Reason Eval/Treat Not Completed: PT screened, no needs identified, will sign off (pt denies need for PT evaluation. Pt reports he has been ambulating independently and RUE weakness has resolved. Please re-consult if needs arise).  [name], PT, DPT Acute Rehabilitation Services Office [phone]    [name] 07/13/2024, 12:25 PM

## 2024-07-13 NOTE — Progress Notes (Signed)
 OT Cancellation Note  Patient Details Name: Marris L [place] MRN: [id number] DOB: 10-07-1971   Cancelled Treatment:    Reason Eval/Treat Not Completed: OT screened, no needs identified, will sign off (Pt reports having returned to near his baseline.)  Kennth Mliss Helling 07/13/2024, 1:18 PM Mliss HERO, OTR/L Acute Rehabilitation Services Office: [phone]

## 2024-07-13 NOTE — Progress Notes (Addendum)
 HD#0 SUBJECTIVE:  Patient Summary: Roy [name] is a 53 y.o. with a pertinent PMH of HTN, HLD, bradycardia, prediabetes, nonobstructive coronary artery disease, cerebral aneurysm post coil, hematuria, and right-sided pleural effusion s/p thoracentesis who presented with hypotension and weakness and admitted for sepsis.   Overnight Events: none  Interim History: Patient evaluated at bedside. Notes a tension headache around 0840 this morning. He has not had a BM yet and would like some medication to assist with the constipation. He denies feeling any dizziness or weakness when he has gotten up to go to the bathroom. He notes that his urine is no longer pink. He denies any numbness or weakness in his arm today. Overall feeling improvement from weakness yesterday.   OBJECTIVE:  Vital Signs: Vitals:   07/12/24 2049 07/13/24 0452 07/13/24 0822 07/13/24 0837  BP: 113/62 111/65 (!) 103/50   Pulse: 61 69 76 75  Resp: 18 18  16   Temp: 98.9 F (37.2 C) 98.1 F (36.7 C) 98.5 F (36.9 C)   TempSrc:   Oral   SpO2: 98% 99% 98% 97%  Weight:      Height:       Supplemental O2: Room Air SpO2: 97 %  Filed Weights   07/12/24 0921  Weight: 111.1 kg     Intake/Output Summary (Last 24 hours) at 07/13/2024 1157 Last data filed at 07/13/2024 1132 Gross per 24 hour  Intake 3857.68 ml  Output 1750 ml  Net 2107.68 ml   Net IO Since Admission: 2,107.68 mL [07/13/24 1157]  Physical Exam: Physical Exam Constitutional:      Appearance: Normal appearance.  HENT:     Head: Normocephalic and atraumatic.  Eyes:     Extraocular Movements: Extraocular movements intact.     Pupils: Pupils are equal, round, and reactive to light.  Cardiovascular:     Rate and Rhythm: Normal rate and regular rhythm.     Pulses: Normal pulses.  Pulmonary:     Effort: Pulmonary effort is normal.     Breath sounds: Normal breath sounds.  Abdominal:     General: Abdomen is flat.     Palpations: Abdomen is soft.   Musculoskeletal:        General: Normal range of motion.  Skin:    General: Skin is warm and dry.  Neurological:     General: No focal deficit present.     Mental Status: He is alert and oriented to person, place, and time.  Psychiatric:        Mood and Affect: Mood normal.     Patient Lines/Drains/Airways Status     Active Line/Drains/Airways     Name Placement date Placement time Site Days   Peripheral IV 07/12/24 22 G Distal;Left;Posterior Wrist 07/12/24  1703  Wrist  1   Incision (Closed) 09/02/18 Chest Right 09/02/18  1320  -- 2141            Pertinent labs and imaging:      Latest Ref Rng & Units 07/13/2024    5:20 AM 07/12/2024    9:25 AM 09/07/2018    3:34 AM  CBC  WBC 4.0 - 10.5 K/uL 9.7  12.1  6.7   Hemoglobin 13.0 - 17.0 g/dL 9.6  86.5  89.9   Hematocrit 39.0 - 52.0 % 29.1  40.2  30.0   Platelets 150 - 400 K/uL 259  281  571        Latest Ref Rng & Units 07/13/2024  5:20 AM 07/12/2024    9:25 AM 09/05/2018    4:18 AM  CMP  Glucose 70 - 99 mg/dL 875  884  899   BUN 6 - 20 mg/dL 25  35  8   Creatinine 0.61 - 1.24 mg/dL 8.59  7.57  9.12   Sodium 135 - 145 mmol/L 139  139  138   Potassium 3.5 - 5.1 mmol/L 3.9  4.4  3.9   Chloride 98 - 111 mmol/L 105  103  100   CO2 22 - 32 mmol/L 27  25  29    Calcium  8.9 - 10.3 mg/dL 8.6  9.1  8.3   Total Protein 6.5 - 8.1 g/dL  6.6    Total Bilirubin 0.0 - 1.2 mg/dL  1.2    Alkaline Phos 38 - 126 U/L  44    AST 15 - 41 U/L  24    ALT 0 - 44 U/L  18      CT Renal Stone Study Result Date: 07/12/2024 CLINICAL DATA:  Abdominal/flank pain, stone suspected EXAM: CT ABDOMEN AND PELVIS WITHOUT CONTRAST TECHNIQUE: Multidetector CT imaging of the abdomen and pelvis was performed following the standard protocol without IV contrast. RADIATION DOSE REDUCTION: This exam was performed according to the departmental dose-optimization program which includes automated exposure control, adjustment of the mA and/or kV according to  patient size and/or use of iterative reconstruction technique. COMPARISON:  None Available. FINDINGS: Lower chest: No acute abnormality. Hepatobiliary: Scattered subcentimeter focal hypodensities in the right and left hepatic lobes are too small to definitively characterize and may represent small cysts. Gallbladder is unremarkable. No biliary dilatation. Pancreas: Unremarkable. No pancreatic ductal dilatation or surrounding inflammatory changes. Spleen: Normal in size without focal abnormality. Adrenals/Urinary Tract: Adrenal glands are unremarkable. Bilateral renal cysts with the largest measuring up to 2.1 cm arising from the superior pole of the right kidney. Nonobstructive 10 mm calculus in the inferior pole of the right kidney. Nonobstructive 5 mm calculus in the inferior pole of the left kidney. No ureteral calculi. No hydronephrosis. Bladder is unremarkable. Stomach/Bowel: Stomach is within normal limits. Appendix appears normal. No evidence of bowel wall thickening, distention, or inflammatory changes. Vascular/Lymphatic: Aortic atherosclerosis. Nonaneurysmal abdominal aorta. No enlarged abdominal or pelvic lymph nodes. Reproductive: Prostate is unremarkable. Other: Small fat containing paraumbilical hernia. No abdominopelvic ascites. No intraperitoneal free air. Musculoskeletal: No acute osseous abnormality. No suspicious osseous lesion. Degenerative disc height loss and endplate osteophytosis at L4-L5 and L5-S1. IMPRESSION: 1. Nonobstructive bilateral renal calculi measuring up to 10 mm on the right. No ureteral calculi. No hydronephrosis. 2.  Aortic Atherosclerosis (ICD10-I70.0). Electronically Signed   By: Harrietta Sherry M.D.   On: 07/12/2024 12:36    ASSESSMENT/PLAN:  Assessment: Principal Problem:   Sepsis [facility]) Active Problems:   Essential hypertension   History of cerebral aneurysm repair   Nephrolithiasis   UTI (urinary tract infection)  Roy [name] is a 53 y.o. with a  pertinent PMH of HTN, HLD, bradycardia, prediabetes, nonobstructive coronary artery disease, cerebral aneurysm post coil, hematuria, and right-sided pleural effusion s/p thoracentesis who presented with hypotension and weakness and admitted for sepsis.   Plan: #Sepsis UTI Patient hypotensive on admission and received 1L LR + mIVF for resuscitation. BP improved, 103/50 currently while laying. Leukocytosis improved from 12.1-9.7 today.  Patient remains afebrile and denies chills today.  Hematuria improved and no noted blood in urine.  Still pending urine culture, but patient condition improving overall.  Discussed with patient the  need for outpatient urology follow-up considering his history of kidney stones given his clinical improvement.  -Continue IV Ceftriaxone  2g daily (day 2/3) -Pending blood culture results -AM CBC and BMP -PT/OT -Strict I&Os -Daily weights  #AKI vs CKD Creatinine on admission was elevated to 2.42. Baseline is 0.85-0.87 with most recent normal creatinine 1.22 in 03/2023. Creatinine today improved to 1.4. Patient noticed improvement of hematuria today, did not note any gross hematuria.  -AM BMP -Pending urine protein/Cr ratio  #Normocytic anemia Hemoglobin normal on admission, 13.4. Decreased to 9.6 today following fluid resuscitation. Hematuria noted on UA, visible to patient prior to admission which has now resolved. No additional source of bleeding. Appears that this may be chronic but we don't have recent levels. CTM.-  #Hypertension Hypotension, resolved BP 111/65 today.  Patient has home medications of carvedilol  6.25 twice daily, losartan 50 mg, chlorthalidone 25 mg.  He also takes phentermine 30 mg daily for weight management. Additionally, he takes sildenafil and pregabalin.  Reports not taking the sildenafil very often and has no recent doses.  States he took his pregabalin 2 nights ago, but this is unlikely to have caused his hypotensive episodes yesterday.   Blood pressures are still soft today, on continuous LR.  Will continue to hold his antihypertensives in his hypotensive setting and slowly add back as he normalizes. -Hold mIVF, encourage PO intake  #Prior Cerebral Aneurysm with coil placement Prior to arrival patient had right arm weakness and numbness that resolved with fluids. Could be due to vluneratble area aorund coil with global hypoperfusion causing similar effect as recrudescence. This is completely resolved and CT scan of the head without contrast does not show any signs of intracranial bleed.  Patient notes a tension headache today.  Will continue to monitor for prolonged headaches, which may require further head imaging if he continues having headaches and develops new symptoms such as weakness. -MRI/MRA -Compazine  for headache  #Prediabetes Chronic, stable. Last A1c 6.0% in 2021. New A1c 5.8% yesterday. Given stable, controlled A1c, new kidney dysfunction less likely attributed to diabetic causes.  #Hyperlipidemia Chronic, stable. Last lipid panel 02/2023 shows total cholesterol 196, LDL 82, triglycerides 340. 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 Follow up with ENT outpatient but no signs of parotid gland dysfunction or dysphagia.  Best Practice: Diet: Regular diet IVF: Fluids: LR, Rate: 1L cc bolus VTE: enoxaparin  (LOVENOX ) injection 40 mg Start: 07/12/24 2200 Code: Full  Disposition planning: Therapy Recs: Home Family Contact: fiance and daughter DISPO: Anticipated discharge tomorrow to Home pending clinical improvement.  Signature:  Gwendalyn Mcgonagle [facility]  11:57 AM, 07/13/2024  On Call pager [phone]

## 2024-07-13 NOTE — TOC CM/SW Note (Signed)
 Transition of Care [facility]) - Inpatient Brief Assessment   Patient Details  Name: Roy [name] MRN: [id number] Date of Birth: Apr 26, 1971  Transition of Care [facility]) CM/SW Contact:    Tom-Johnson, Harvest Muskrat, RN Phone Number: 07/13/2024, 2:23 PM   Clinical Narrative:  Patient presented to the ED after a witnessed Syncopal episode at home.  Patient was Hypotensive on admission with BP 85/53. Given fluid resuscitation. CT Renal study showed Obstructive Bilateral Renal Calculi measuring 10mm on the Rt with no Ureteral Calculi or Hydronephrosis. UA positive for Cystitis, started on IV abx. Admitted with Sepsis. Patient has hx of Kidney Stones, gross Hematuria and previous Cystoscopy with [facility].   From home with Significant Other, has a supportive daughter who lives out of state. Employed, independent with care and drive self. Does not have DME's at home.  PCP is Rollene Therisa BRAVO, FNP and uses [facility] on [address] [place].  No PT/OT f/u, no TOC needs or recommendations noted at this time.  Patient not Medically ready for discharge.  CM will continue to follow as patient progresses with care towards discharge.        Transition of Care Asessment: Insurance and Status: Insurance coverage has been reviewed Patient has primary care physician: Yes Home environment has been reviewed: Yes Prior level of function:: Independent Prior/Current Home Services: No current home services Social Drivers of Health Review: SDOH reviewed no interventions necessary Readmission risk has been reviewed: Yes Transition of care needs: no transition of care needs at this time

## 2024-07-13 NOTE — Plan of Care (Signed)
   Problem: Education: Goal: Knowledge of General Education information will improve Description: Including pain rating scale, medication(s)/side effects and non-pharmacologic comfort measures Outcome: Progressing   Problem: Clinical Measurements: Goal: Diagnostic test results will improve Outcome: Progressing   Problem: Activity: Goal: Risk for activity intolerance will decrease Outcome: Progressing   Problem: Nutrition: Goal: Adequate nutrition will be maintained Outcome: Progressing   Problem: Coping: Goal: Level of anxiety will decrease Outcome: Progressing

## 2024-07-14 ENCOUNTER — Inpatient Hospital Stay (HOSPITAL_COMMUNITY)

## 2024-07-14 DIAGNOSIS — I6340 Cerebral infarction due to embolism of unspecified cerebral artery: Secondary | ICD-10-CM

## 2024-07-14 DIAGNOSIS — I639 Cerebral infarction, unspecified: Secondary | ICD-10-CM | POA: Insufficient documentation

## 2024-07-14 LAB — URINE CULTURE: Culture: >=100000 — AB

## 2024-07-14 LAB — BASIC METABOLIC PANEL WITH GFR
Anion gap: 9 (ref 5–15)
BUN: 20 mg/dL (ref 6–20)
CO2: 27 mmol/L (ref 22–32)
Calcium: 9.3 mg/dL (ref 8.9–10.3)
Chloride: 105 mmol/L (ref 98–111)
Creatinine, Ser: 1.41 mg/dL — ABNORMAL HIGH (ref 0.61–1.24)
GFR, Estimated: 59 mL/min — ABNORMAL LOW (ref >60)
Glucose, Bld: 116 mg/dL — ABNORMAL HIGH (ref 70–99)
Potassium: 4.4 mmol/L (ref 3.5–5.1)
Sodium: 141 mmol/L (ref 135–145)

## 2024-07-14 LAB — CBC WITH DIFFERENTIAL/PLATELET
Abs Immature Granulocytes: 0.01 K/uL (ref 0.00–0.07)
Basophils Absolute: 0 K/uL (ref 0.0–0.1)
Basophils Relative: 0 %
Eosinophils Absolute: 0.2 K/uL (ref 0.0–0.5)
Eosinophils Relative: 4 %
HCT: 30.6 % — ABNORMAL LOW (ref 39.0–52.0)
Hemoglobin: 10.3 g/dL — ABNORMAL LOW (ref 13.0–17.0)
Immature Granulocytes: 0 %
Lymphocytes Relative: 22 %
Lymphs Abs: 1.3 K/uL (ref 0.7–4.0)
MCH: 33.3 pg (ref 26.0–34.0)
MCHC: 33.7 g/dL (ref 30.0–36.0)
MCV: 99 fL (ref 80.0–100.0)
Monocytes Absolute: 0.8 K/uL (ref 0.1–1.0)
Monocytes Relative: 14 %
Neutro Abs: 3.5 K/uL (ref 1.7–7.7)
Neutrophils Relative %: 60 %
Platelets: 266 K/uL (ref 150–400)
RBC: 3.09 MIL/uL — ABNORMAL LOW (ref 4.22–5.81)
RDW: 13.4 % (ref 11.5–15.5)
WBC: 5.8 K/uL (ref 4.0–10.5)
nRBC: 0 % (ref 0.0–0.2)

## 2024-07-14 MED ORDER — ASPIRIN 81 MG PO TBEC
81.0000 mg | DELAYED_RELEASE_TABLET | Freq: Every day | ORAL | Status: DC
  Administered 2024-07-14 – 2024-07-17 (×4): 81 mg via ORAL
  Filled 2024-07-14 (×4): qty 1

## 2024-07-14 MED ORDER — HYDROXYZINE HCL 25 MG PO TABS
25.0000 mg | ORAL_TABLET | Freq: Three times a day (TID) | ORAL | Status: DC | PRN
  Administered 2024-07-14 – 2024-07-16 (×3): 25 mg via ORAL
  Filled 2024-07-14 (×3): qty 1

## 2024-07-14 MED ORDER — LORAZEPAM 0.5 MG PO TABS
0.5000 mg | ORAL_TABLET | Freq: Once | ORAL | Status: AC | PRN
  Administered 2024-07-14: 0.5 mg via ORAL
  Filled 2024-07-14: qty 1

## 2024-07-14 MED ORDER — LEVOFLOXACIN 750 MG PO TABS
750.0000 mg | ORAL_TABLET | Freq: Every day | ORAL | Status: DC
  Administered 2024-07-14 – 2024-07-17 (×4): 750 mg via ORAL
  Filled 2024-07-14 (×4): qty 1

## 2024-07-14 MED ORDER — SENNOSIDES-DOCUSATE SODIUM 8.6-50 MG PO TABS
2.0000 | ORAL_TABLET | Freq: Two times a day (BID) | ORAL | Status: DC
  Administered 2024-07-14 – 2024-07-16 (×6): 2 via ORAL
  Filled 2024-07-14 (×7): qty 2

## 2024-07-14 NOTE — Discharge Instructions (Addendum)
 Roy [name],  You were recently admitted to [facility] for a urinary tract infection and strokes seen in the left side of your brain. You will need to follow up with the neurology office after leaving the hospital.   Continue taking your home medications with the following changes  Start taking: Levofloxacin  (Levaquin ) 750mg  every day for 7 days. Your first dose was today in the hospital, so 07/15/24 will start day 2. Please finish your antibiotics completely. Aspirin  81 mg daily Increase your atorvastatin  to 80 mg daily  Stop taking: Ibuprofen  (Advil , Motrin ), naproxen (Aleve), Meloxicam (Mobic), or Diclofenac (Voltaren). This class of medicines (NSAIDs) can cause kidney issues and lower your kidney function if too much is taken. The gel form of Diclofenac (Voltaren) is safe to use, but not the pill form.  Pause (stop) taking for now: Bupropion (Wellbutrin) 150mg . You were taking this to quit smoking and as needed for anxiety. This medicine does not work for anxiety very well when taken on a as needed basis. Talk with your primary doctor about better options for anxiety if you no longer need this to stop smoking. Carvedilol  (Coreg ) 6.25mg , Chlorthalidone 25mg , Losartan 50mg . Your blood pressure was low when you were admitted to the hospital, and remained low-to-normal while off your blood pressure medicines. The highest blood pressure you had was 143/96, which only occurred one time. Otherwise, your blood pressure did well without any medicines. Please follow up with your primary doctor about which medicine to continue to take as your blood pressure may gradually increase after you are discharged Phentermine 30mg . This medicine can cause your blood pressure to increase. Your blood pressure was normal while you were here. Following up with your PCP about when to start this medicine again would be best.  Continue taking: Tylenol  500mg  as needed for headache or for pain Albuterol   inhaler Spiriva inhaler Symbicort  inhaler   You should seek further medical care if you develop low blood pressure less than 90/60. If you feel you are developing another urinary infection, feel worsening fever more than 102 and chills, please return to the hospital.  We recommend that you see your primary care doctor in about a week to make sure that you continue to improve. You may keep your 07/24/2024 appointment with your PCP. I would like you to check your blood pressures daily. If they start to read >160s on the top number before the 11th, please call your PCP.  We would also like you to follow up with your urologist outpatient for more management of your kidney stones.   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 The parotid is a salivary gland near our ears and normally produces saliva as it's main job. A neoplasm means an abnormal mass of tissue that could be benign or malignant. We would like you to follow up with an Ears, Nose, and Throat specialist (ENT-- otolaryngologist) about this finding so they can do further workup.  Sincerely, Aniyah Nobis, DO & Fairy Pool, DO

## 2024-07-14 NOTE — Progress Notes (Addendum)
 HD#1 SUBJECTIVE:  Patient Summary: Roy [name] is a 53 y.o. with a pertinent PMH of HTN, HLD, bradycardia, prediabetes, nonobstructive coronary artery disease, cerebral aneurysm post coil, hematuria, and right-sided pleural effusion s/p thoracentesis who presented with hypotension and weakness and admitted for sepsis.   Overnight Events: none  Interim History: Patient evaluated at bedside. Reports mild headache this morning, similar to the prior two days. He denies SOB, fever, or chills. He works driving trucks typically M-F and is home on the weekend and wondering about clearance to return to driving at work.  OBJECTIVE:  Vital Signs: Vitals:   07/13/24 2050 07/14/24 0550 07/14/24 0915 07/14/24 1708  BP: 130/78 (!) 143/96 129/70 137/88  Pulse: 62 72 75   Resp: 18  19 18   Temp: 98.6 F (37 C) 98.5 F (36.9 C) 98.7 F (37.1 C) 99.2 F (37.3 C)  TempSrc:  Oral    SpO2:  99% 99%   Weight:      Height:       Supplemental O2: Room Air SpO2: 99 %  Filed Weights   07/12/24 0921  Weight: 111.1 kg     Intake/Output Summary (Last 24 hours) at 07/14/2024 1812 Last data filed at 07/14/2024 1700 Gross per 24 hour  Intake 1020 ml  Output 3000 ml  Net -1980 ml   Net IO Since Admission: 1,067.68 mL [07/14/24 1812]  Physical Exam: Constitutional:well appearing middle aged male laying in bed. In no acute distress. Pulm: Normal work of breathing on room air. FDX:Wzhjupcz for extremity edema. Skin:Warm and dry. Neuro:Alert and oriented x3.   Patient Lines/Drains/Airways Status     Active Line/Drains/Airways     Name Placement date Placement time Site Days   Peripheral IV 07/12/24 22 G Distal;Left;Posterior Wrist 07/12/24  1703  Wrist  1   Incision (Closed) 09/02/18 Chest Right 09/02/18  1320  -- 2141            Pertinent labs and imaging:      Latest Ref Rng & Units 07/14/2024    6:09 AM 07/13/2024    5:20 AM 07/12/2024    9:25 AM  CBC  WBC 4.0 - 10.5 K/uL 5.8   9.7  12.1   Hemoglobin 13.0 - 17.0 g/dL 89.6  9.6  86.5   Hematocrit 39.0 - 52.0 % 30.6  29.1  40.2   Platelets 150 - 400 K/uL 266  259  281        Latest Ref Rng & Units 07/14/2024    6:09 AM 07/13/2024    5:20 AM 07/12/2024    9:25 AM  CMP  Glucose 70 - 99 mg/dL 883  875  884   BUN 6 - 20 mg/dL 20  25  35   Creatinine 0.61 - 1.24 mg/dL 8.58  8.59  7.57   Sodium 135 - 145 mmol/L 141  139  139   Potassium 3.5 - 5.1 mmol/L 4.4  3.9  4.4   Chloride 98 - 111 mmol/L 105  105  103   CO2 22 - 32 mmol/L 27  27  25    Calcium  8.9 - 10.3 mg/dL 9.3  8.6  9.1   Total Protein 6.5 - 8.1 g/dL   6.6   Total Bilirubin 0.0 - 1.2 mg/dL   1.2   Alkaline Phos 38 - 126 U/L   44   AST 15 - 41 U/L   24   ALT 0 - 44 U/L   18  MR BRAIN WO CONTRAST Result Date: 07/14/2024 EXAM: MRI BRAIN WITHOUT CONTRAST 07/14/2024 01:59:29 PM TECHNIQUE: Multiplanar multisequence MRI of the head/brain was performed without the administration of intravenous contrast. COMPARISON: CT of the head dated 07/12/2024. CLINICAL HISTORY: Hx of left PCA with coil. R/O aneurysm or new coil movement. FINDINGS: BRAIN AND VENTRICLES: Scattered foci of restricted diffusion are present within the left cerebral hemisphere, including the precentral and postcentral gyri and the occipital lobe. Numerous scattered foci of increased T2 signal are present within the subcortical and deep cerebral white matter. No intracranial hemorrhage. No mass. No midline shift. No hydrocephalus. The sella is unremarkable. Normal flow voids. ORBITS: Disconjugate gaze. SINUSES AND MASTOIDS: No acute abnormality. BONES AND SOFT TISSUES: A cystic lesion is noted within the right parotid measuring approximately 2 cm in diameter, as seen on the previous study. IMPRESSION: 1. Scattered foci of restricted diffusion within the left cerebral hemisphere, including the precentral and postcentral gyri and the occipital lobe, which are compatible with acute Embolic Nonhemorrhagic  infarcts. 2. Numerous scattered foci of increased T2 signal within the subcortical and deep cerebral white matter. 3. Disconjugate gaze. 4. 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 Cystic neoplasm remains a diagnosis of exclusion. Electronically signed by: evalene coho 07/14/2024 05:36 PM EDT RP Workstation: [id number]   MR ANGIO HEAD WO CONTRAST Result Date: 07/14/2024 EXAM: MR Angiography Head without intravenous Contrast. 07/14/2024 01:59:29 PM TECHNIQUE: Magnetic resonance angiography images of the head without intravenous contrast. Multiplanar 2D and 3D reformatted images are provided for review. COMPARISON: None provided. CLINICAL HISTORY: History of left PCA with coil. Rule out aneurysm or new coil movement. FINDINGS: ANTERIOR CIRCULATION: There is mild stenosis of the anterior genua of the cavernous internal carotid arteries bilaterally. There is no flow-limiting stenosis. No significant stenosis of the anterior cerebral arteries. No significant stenosis of the middle cerebral arteries. No aneurysm. POSTERIOR CIRCULATION: No significant stenosis of the posterior cerebral arteries. No significant stenosis of the basilar artery. No significant stenosis of the vertebral arteries. No aneurysm. IMPRESSION: 1. No evidence of aneurysm or new coil movement in the left PCA. 2. Mild stenosis of the anterior Genua of the cavernous internal carotid arteries bilaterally, without flow-limiting stenosis. Electronically signed by: evalene coho 07/14/2024 03:01 PM EDT RP Workstation: [id number]    ASSESSMENT/PLAN:  Assessment: Principal Problem:   Sepsis (HCC) Active Problems:   Essential hypertension   History of cerebral aneurysm repair   Nephrolithiasis   UTI (urinary tract infection)   AKI (acute kidney injury) (HCC)  Roy [name] is a 53 y.o. with a pertinent PMH of HTN, HLD, bradycardia, prediabetes,  nonobstructive coronary artery disease, cerebral aneurysm post coil, hematuria, and right-sided pleural effusion s/p thoracentesis who presented with hypotension and weakness and admitted for sepsis.   Plan: Acute left cerebral infarcts, likely embolic MRI head without today showed scattered areas of restricted diffusion in the left cerebral hemisphere concerning for acute embolic infarcts.  Likely the cause of his acute but resolved right upper extremity deficits prior to admission.  No known atrial fibrillation and no signs of DVT here.  MRA of the head with mild stenosis of the cavernous internal carotid bilaterally without flow-limiting stenosis and no change around left PCA coil.  We will involve neurology and do a full stroke workup. - Neurology consult - Echocardiogram - Lower extremity venous Dopplers - Lipid panel   Citrobacter freundii UTI Bilateral nonobstructing renal stones Urine  culture resulted today with Citrobacter which has an inducible amp C gene which would create resistance to ceftriaxone  which is the antibiotic he has been on, discussed with pharmacy and switch to fluoroquinolone recommended.  Symptomatically and objectively he is doing much better.  We will plan to treat for 7 days with above information making today day 1.  Also has bilateral nonobstructing renal stones measuring up to 10 mm that will need to be addressed but there is low suspicion for infected renal stone. - Start levofloxacin 750 mg daily for 7 days - Urology follow-up outpatient  #AKI vs CKD Creatinine 1.41 and stable today.  Most recent creatinine 1.22 about 4 months ago.  Will need further monitoring to determine if this is CKD.  #Normocytic anemia Improved hemoglobin level after acute drop likely in the setting of hemodilution.  Hemoglobin 10.3 today.  No signs of active bleeding.  #Hypertension Hypotension, resolved Remains normotensive to slightly hypertensive off carvedilol , chlorthalidone,  and losartan.  #Prior Cerebral Aneurysm with coil placement As above MRI to evaluate left PCA aneurysm coil showed no changes.  #Prediabetes Chronic, stable. Last A1c 6.0% in 2021.  A1c here 5.8.  #Hyperlipidemia Chronic, stable. Last lipid panel 02/2023 shows total cholesterol 196, LDL 82, triglycerides 340. -continue home atorvastatin  40mg  - Repeat lipid panel  #Incidental Lesion on CT 2.5 cystic appearing mass within the right parotid space. Follow up with ENT outpatient but no signs of parotid gland dysfunction or dysphagia.  MRI shows unchanged from CT on admission and will need ENT follow-up.  Best Practice: Diet: Regular diet IVF: None VTE: enoxaparin  (LOVENOX ) injection 40 mg Start: 07/12/24 2200 Code: Full  Disposition planning: Therapy Recs: Home Family Contact: fiance and daughter DISPO: Anticipated discharge in 1-2 days pending stroke workup.  Signature:  Fairy Jolaine [facility]  6:12 PM, 07/14/2024  On Call pager [phone]

## 2024-07-14 NOTE — Discharge Summary (Incomplete)
 Name: Roy [name] MRN: [id number] DOB: 06/18/1971 53 y.o. PCP: Rollene Therisa BRAVO, FNP (Inactive)  Date of Admission: 07/12/2024  9:07 AM Date of Discharge: 07/17/2024  Attending Physician: Dr. MICAEL Riis Winfrey  Discharge Diagnosis: Principal Problem:   Sepsis [facility]) Active Problems:   Essential hypertension   History of cerebral aneurysm repair   Nephrolithiasis   UTI (urinary tract infection)   AKI (acute kidney injury) (HCC)   Cerebrovascular accident (CVA) [facility])    Discharge Medications: Allergies as of 07/17/2024   No Known Allergies      Medication List     PAUSE taking these medications    buPROPion 150 MG 12 hr tablet Wait to take this until your doctor or other care provider tells you to start again. Commonly known as: WELLBUTRIN SR Take 150 mg by mouth 2 (two) times daily as needed (anxiety, relaxation).   chlorthalidone 25 MG tablet Wait to take this until your doctor or other care provider tells you to start again. Commonly known as: HYGROTON Take 25 mg by mouth daily.   losartan 50 MG tablet Wait to take this until your doctor or other care provider tells you to start again. Commonly known as: COZAAR Take 50 mg by mouth daily.   Viagra 100 MG tablet Wait to take this until your doctor or other care provider tells you to start again. Generic drug: sildenafil Take 100 mg by mouth as needed for erectile dysfunction.       STOP taking these medications    carvedilol  6.25 MG tablet Commonly known as: COREG    ibuprofen  200 MG tablet Commonly known as: ADVIL    phentermine 30 MG capsule       TAKE these medications    acetaminophen  500 MG tablet Commonly known as: TYLENOL  Take 2 tablets (1,000 mg total) by mouth every 6 (six) hours as needed for mild pain or fever.   albuterol  108 (90 Base) MCG/ACT inhaler Commonly known as: VENTOLIN  HFA Inhale 2 puffs into the lungs every 6 (six) hours as needed for wheezing or shortness of  breath.   aspirin  EC 81 MG tablet Take 1 tablet (81 mg total) by mouth daily. Swallow whole.   atorvastatin  80 MG tablet Commonly known as: LIPITOR Take 1 tablet (80 mg total) by mouth at bedtime. What changed:  medication strength how much to take   clopidogrel  75 MG tablet Commonly known as: PLAVIX  Take 1 tablet (75 mg total) by mouth daily. Start taking on: July 18, 2024   levofloxacin  750 MG tablet Commonly known as: LEVAQUIN  Take 1 tablet (750 mg total) by mouth daily.   pregabalin 75 MG capsule Commonly known as: LYRICA Take 75-150 mg by mouth daily as needed (pain).   Spiriva HandiHaler 18 MCG inhalation capsule Generic drug: tiotropium Place 18 mcg into inhaler and inhale daily as needed (wheezing).   Symbicort  160-4.5 MCG/ACT inhaler Generic drug: budesonide -formoterol  Inhale 2 puffs into the lungs in the morning and at bedtime.        Disposition and follow-up:   Mr.Rivaldo L [name] was discharged from [facility] in Good condition.  At the hospital follow up visit please address:  1.  Follow-up:  a. Adjusting his antihypertensives. He was normotensive to slightly hypertensive during admission and was not given any BP medications.    b. Weight management strategy. He was stopped on his home Phentermine for risks of stroke. He has already lost 50 pounds and is motivated to lose more.  Switching to a medicine that will optimize his health while keeping his stroke risk minimal is best.    Follow-up Appointments:  Follow-up Information     Rihner, Emilie, DO. Go in 1 week(s).   Specialty: Internal Medicine Why: Your appointment is on 07/24/2024 at 2:45pm. Please bring all of your medications with you. Arrive 25-30 minutes early for paperwork. Contact information: [address] [place] [address] [phone]                 Hospital Course by problem list: Roy [name] is a 53 y.o. person living with a  history of HTN, HLD, bradycardia, nonobstructive coronary artery disease, cerebral aneurysm post coil, hematuria, and right-sided pleural effusion status post thoracentesis who presented with hypotension and admitted for sepsis.     Acute left cerebral infarcts, likely embolic MRI head without today showed scattered areas of restricted diffusion in the left cerebral hemisphere concerning for acute embolic infarcts.  Likely the cause of his acute but resolved right upper extremity deficits prior to admission. Patient remembered some slow speech when he had his right arm weakness before arrival to the hospital which also resolved.  No known atrial fibrillation and no signs of DVT here. MRA of the head with mild stenosis of the cavernous internal carotid bilaterally without flow-limiting stenosis and no change around left PCA coil. Neurology was consulted and recommended an embolic stroke workup. LDL elevated at 128. CTA Head and Neck showing no large vessel occlusion, hemodynamically significant stenosis, or aneurysm in the head or neck. He does have a mild stenosis of the left P1 and P2 segments. Echocardiogram shows EF 60-65% with no LVH, mild left atrial dilation, with a trivial mitral valve regurgitation but no active mitral or aortic stenosis. Bubble study negative for any evidence of intracranial shunt. TEE and US  LE DVT scheduled for further assessment. Cardiology consulted at neurology's request to schedule TEE on 8/4. Patient was placed on Aspirin  81mg  and Plavix  75mg . Home atorvastatin  was increased to 80mg . Patient may need loop recorder and 30day monitor on discharge. Lower extremity venous Dopplers showed no signs of DVT bilaterally. TEE was negative for any cardiac involvement for stroke. Loop recorder was placed by the EP team.    #Sepsis 2/2 Citrobacter UTI Bilateral nonobstructing renal stones Patient presented after an episode of right arm/hand numbness/weakness in the setting of several  days of fatigue, malaise, chills, and increased urinary frequency. On presentation to the ED, he was afebrile but hypotensive with initial BP noted at 85/53. Had a presyncopal vs syncopal event. Received IVF with improvement in BP to 99/57. BP improved to 132/77 on day of discharge. Labs notable for leukocytosis of 12.1, elevated Cr with suspected AKI, LA 2.1, and UA with hematuria, pyuria, and bacteruria. Imaging reviewed-- CT Renal study showed non-obstructive bilateral renal calculi measuring up to 10mm on the right with no ureteral calculi or hydronephrosis. CT head noting distal left ICA siphon stent. Started on IV Ceftriaxone  for suspected urinary sepsis. Urine culture positive for gram negative rods, identified as citrobacter freundii. This is part of the enterobacter family, and is susceptible to fluoroquinolones. Started patient on PO Levaquin  750mg  daily for 7 days. Given rapid improvement and non-obstructing stones, planned for outpatient urology follow up for nephrolithiasis management considering his history of kidney stones. IV fluids were held and encourage PO intake. Patient clinically improved and was discharged after embolic stroke workup and four doses of Levaquin  750mg  while inpatient.    #  AKI versus CKD Creatinine elevated ton admission to 2.42. Improved to 1.40 on day of discharge. Baseline creatinine 0.85-0.87 from 2019. Most recent creatinine of 1.22 in 03/2023.  Patient did not seem volume depleted and had evidence of bilateral nonobstructive kidney stones, with the right stone being 10mm. Patient has history of kidney stones and gross hematuria. Initially concerned for obstructive kidney stones worsening his kidney function via hydronephrosis. Had a previous cystoscopy with [facility] and Dr. Lamarr Commander. Trended daily labs, showing overall improvement of kidney function.   #Hypertension Hypotension Patient on carvedilol  6.25 BID, losartan 50mg , chlorthalidone 25mg . On  phentermine for weight treatment. Patient also on sildenafil and pregabalin.  Patient states that he does not use sildenafil very often with no recent doses, but noted he took his pregabalin the night before his admission. This is unlikely to have caused his hypotensive episodes while hospitalized. Hypotensive episodes likely due to sepsis. His antihypertensives were held in his hypotensive setting and throughout his hospital course as he sustained low-to-normal blood pressures. He was encouraged to increase his PO fluid intake, and responded well to IV fluids. He also was recommended to stop his Phentermine, as it has risk factors for stroke.   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 There was suspicion for possible hypoperfusion causing right arm/hand sensory and motor changes in ED. IV fluids were given and improved the patient.This completely resolved and CT scan of the head without contrast did not show any signs of intracranial bleed. Patient noted a tension headache throughout his hospitalization that was unresponsive to Tylenol  alone. MRI/MRA Brain ordered to rule out any new or worsening neurodeficits. MR Angio Head showed no evidence of aneurysm or new coil movement in the left PCA, but some mild, non-flow limiting stenosis of the anterior [place] of the cavernous internal carotid arteries bilaterally. MRI Brain showed no change in the placement of his coil.  #Prediabetes Chronic, stable condition.  Last A1c 6.0 in 2021. Given concern for lowered kidney function, repeat A1c was performed to rule out diabetic causes of impaired function. New A1c on 5.8% on 7/31. Given stable, controlled new A1c, new kidney dysfunction was less likely attributed to a diabetic cause.  #Hyperlipidemia Chronic, stable condition. Last lipid panel 02/2023 shows total cholesterol 196, LDL 82, triglycerides 340. Given  patient's new embolic strokes, was recommended to increase his home Atorvastatin  dose to 80mg . Continued home atorvastatin  80mg .   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 Discussed with patient to follow up with ENT outpatient, but no signs of parotid gland dysfunction or dysphagia.   Discharge Subjective: Patient is doing well after TEE and Loop Recorder Implantation. Ready to go home.  Discharge Exam:   BP (!) 129/91   Pulse 70   Temp 98.5 F (36.9 C) (Oral)   Resp 16   Ht 6' 3 (1.905 m)   Wt 116.2 kg   SpO2 98%   BMI 32.02 kg/m  Constitutional: well-appearing male sitting in bed, in no acute distress HENT: normocephalic atraumatic, mucous membranes moist Eyes: conjunctiva non-erythematous Neck: supple Cardiovascular: regular rate and rhythm, no m/r/g Pulmonary/Chest: normal work of breathing on room air, lungs clear to auscultation bilaterally. New loop recorder placed on left mid-sternum with no signs of erythema or infection. Covered by steri-strips and protective tape. Abdominal: soft, non-tender, non-distended MSK: normal bulk and tone Neurological: alert & oriented  x 3, 5/5 strength in bilateral upper and lower extremities, normal gait Skin: warm and dry Psych: pleasant mood and affect   Pertinent Labs, Studies, and Procedures:     Latest Ref Rng & Units 07/17/2024    5:47 AM 07/16/2024    5:07 AM 07/15/2024    6:03 AM  CBC  WBC 4.0 - 10.5 K/uL 7.3  5.8  5.1   Hemoglobin 13.0 - 17.0 g/dL 88.7  88.5  89.1   Hematocrit 39.0 - 52.0 % 33.3  34.3  32.8   Platelets 150 - 400 K/uL 352  322  319        Latest Ref Rng & Units 07/17/2024    5:47 AM 07/16/2024    5:07 AM 07/15/2024    6:03 AM  CMP  Glucose 70 - 99 mg/dL 898  885  894   BUN 6 - 20 mg/dL 24  23  19    Creatinine 0.61 - 1.24 mg/dL 8.59  8.62  8.72   Sodium 135 - 145 mmol/L 138  140  142   Potassium 3.5 - 5.1 mmol/L 4.1  4.3  4.1   Chloride 98 - 111 mmol/L  103  102  103   CO2 22 - 32 mmol/L 26  28  27    Calcium  8.9 - 10.3 mg/dL 9.4  9.8  9.8     CT Renal Stone Study Result Date: 07/12/2024 CLINICAL DATA:  Abdominal/flank pain, stone suspected EXAM: CT ABDOMEN AND PELVIS WITHOUT CONTRAST TECHNIQUE: Multidetector CT imaging of the abdomen and pelvis was performed following the standard protocol without IV contrast. RADIATION DOSE REDUCTION: This exam was performed according to the departmental dose-optimization program which includes automated exposure control, adjustment of the mA and/or kV according to patient size and/or use of iterative reconstruction technique. COMPARISON:  None Available. FINDINGS: Lower chest: No acute abnormality. Hepatobiliary: Scattered subcentimeter focal hypodensities in the right and left hepatic lobes are too small to definitively characterize and may represent small cysts. Gallbladder is unremarkable. No biliary dilatation. Pancreas: Unremarkable. No pancreatic ductal dilatation or surrounding inflammatory changes. Spleen: Normal in size without focal abnormality. Adrenals/Urinary Tract: Adrenal glands are unremarkable. Bilateral renal cysts with the largest measuring up to 2.1 cm arising from the superior pole of the right kidney. Nonobstructive 10 mm calculus in the inferior pole of the right kidney. Nonobstructive 5 mm calculus in the inferior pole of the left kidney. No ureteral calculi. No hydronephrosis. Bladder is unremarkable. Stomach/Bowel: Stomach is within normal limits. Appendix appears normal. No evidence of bowel wall thickening, distention, or inflammatory changes. Vascular/Lymphatic: Aortic atherosclerosis. Nonaneurysmal abdominal aorta. No enlarged abdominal or pelvic lymph nodes. Reproductive: Prostate is unremarkable. Other: Small fat containing paraumbilical hernia. No abdominopelvic ascites. No intraperitoneal free air. Musculoskeletal: No acute osseous abnormality. No suspicious osseous lesion. Degenerative  disc height loss and endplate osteophytosis at L4-L5 and L5-S1. IMPRESSION: 1. Nonobstructive bilateral renal calculi measuring up to 10 mm on the right. No ureteral calculi. No hydronephrosis. 2.  Aortic Atherosclerosis (ICD10-I70.0). Electronically Signed   By: Harrietta Sherry M.D.   On: 07/12/2024 12:36   CT Head Wo Contrast Result Date: 07/12/2024 CLINICAL DATA:  53 year old male with headache and right side numbness. Dizziness, syncope. Hypotensive. EXAM: CT HEAD WITHOUT CONTRAST TECHNIQUE: Contiguous axial images were obtained from the base of the skull through the vertex without intravenous contrast. RADIATION DOSE REDUCTION: This exam was performed according to the departmental dose-optimization program which includes automated exposure control, adjustment of the mA and/or  kV according to patient size and/or use of iterative reconstruction technique. COMPARISON:  None Available. FINDINGS: Brain: Cerebral volume is normal for age. No midline shift, ventriculomegaly, mass effect, evidence of mass lesion, intracranial hemorrhage or evidence of cortically based acute infarction. Gray-white matter differentiation is within normal limits throughout the brain. Vascular: A distal left ICA siphon vascular stent is visible. No suspicious intracranial vascular hyperdensity. Skull: Intact.  No acute osseous abnormality identified. Sinuses/Orbits: Visualized paranasal sinuses and mastoids are well aerated. Tympanic cavities appear clear. Other: Mildly Disconjugate gaze. Negative visible scalp soft tissues. Partially visible round 2.5 cm cystic appearing mass in the right parotid space, series 3, image 1. no regional inflammation. IMPRESSION: 1. Distal left ICA siphon vascular stent is visible. Normal for age noncontrast CT appearance of the Brain. 2. Partially visible 2.5 cm cystic appearing mass within the right parotid space. 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 Electronically Signed   By: VEAR Hurst M.D.   On:  07/12/2024 10:19   DG Chest Port 1 View Result Date: 07/12/2024 CLINICAL DATA:  Syncope, hypotension EXAM: PORTABLE CHEST 1 VIEW COMPARISON:  10/25/2018 and CT chest 03/18/2021 FINDINGS: Mid and lower thoracic spondylosis. Heart size within normal limits. Upper zone pulmonary vascular prominence may be due to supine positioning rather than pulmonary venous hypertension. Left costophrenic angle excluded. The included lungs appear clear. IMPRESSION: 1. Upper zone pulmonary vascular prominence may be due to supine positioning rather than pulmonary venous hypertension. 2. Mid and lower thoracic spondylosis. Electronically Signed   By: Ryan Salvage M.D.   On: 07/12/2024 09:48     Discharge Instructions:   Discharge Instructions      Ollivander L [name],  You were recently admitted to [facility] for a urinary tract infection and strokes seen in the left side of your brain. You will need to follow up with the neurology office after leaving the hospital.   Continue taking your home medications with the following changes  Start taking: Levofloxacin  (Levaquin ) 750mg  every day for 7 days. Your first dose was 8/1 in the hospital, so today 8/4 is day 4 out of 7. Please finish your antibiotics completely. Aspirin  81 mg daily. Please continue this medication. Clopidogrel  (Plavix ) 75mg  once daily. You have taken 2 doses already. The neurology team would like you to take this medicine for 21 days. I have sent a prescription to your pharmacy for 19 more pills. Increase your atorvastatin  to 80 mg daily. Stop taking: Ibuprofen  (Advil , Motrin ), naproxen (Aleve), Meloxicam (Mobic), or Diclofenac (Voltaren). This class of medicines (NSAIDs) can cause kidney issues and lower your kidney function if too much is taken. The topical gel form of Diclofenac (Voltaren) is safe to use, but not the pill form.  Phentermine 30mg . This medicine can cause your blood pressure to increase and has an increased risk for  stroke. Your blood pressure was normal while you were here. Following up with your PCP about other weight loss options is best.  Carvedilol  (Coreg ) 6.25mg . This medicine can make your heart rate slow down. With your history of bradycardia, I would like to stop this completely since your blood pressures have been normal while in the hospital. Pause (stop) taking for now: Bupropion (Wellbutrin) 150mg . You were taking this to quit smoking and as needed for anxiety. This medicine does not work for anxiety very well when taken on a as needed basis. Talk with your primary doctor about better options for anxiety if you no longer need this to stop smoking.  Chlorthalidone 25mg , Losartan 50mg . Your blood pressure was low when you were admitted to the hospital, and remained low-to-normal while off your blood pressure medicines. The highest blood pressure you had was 143/96, which only occurred one time. Otherwise, your blood pressure did well without any medicines. Please follow up with your primary doctor about which medicine to continue to take as your blood pressure may gradually increase after you are discharged. Continue taking: Tylenol  500mg  as needed for headache or for pain Albuterol  inhaler Spiriva inhaler Symbicort  inhaler   You should seek further medical care if you develop low blood pressure less than 90/60. If you feel you are developing another urinary infection, feel worsening fever more than 102 and chills, please return to the hospital.  We recommend that you see your primary care doctor in about a week to make sure that you continue to improve. We will set you up with our clinic for an appointment on 07/24/24 at 2:45pm. I would like you to check your blood pressures daily. If they start to read >160s on the top number before the 11th, please call your PCP.  We want you to follow up with the [facility] neurology team outpatient for management of your new stroke.  We would also like you to  follow up with your urologist outpatient for more management of your kidney stones.   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 The parotid is a salivary gland near our ears and normally produces saliva as it's main job. A neoplasm means an abnormal mass of tissue that could be benign or malignant. We would like you to follow up with an Ears, Nose, and Throat specialist (ENT-- otolaryngologist) about this finding so they can do further workup.  Sincerely, Jinan Biggins, DO & Fairy Pool, DO  Care After Your Loop Recorder  You have a Medtronic Loop Recorder   Monitor your cardiac device site for redness, swelling, and drainage. Call the device clinic at [phone] if you experience these symptoms or fever/chills.  If you notice bleeding from your site, hold firm, but gently pressure with two fingers for 5 minutes. Dried blood on the steri-strips when removing the outer bandage is normal.   Keep the large square bandage on your site for 24 hours and then you may remove it yourself. Keep the steri-strips underneath in place.   You may shower after 72 hours / 3 days from your procedure with the steri-strips in place. They will usually fall off on their own, or may be removed after 10 days. Pat dry.   Avoid lotions, ointments, or perfumes over your incision until it is well-healed.  Please do not submerge in water until your site is completely healed.   Your device is MRI compatible.   Remote monitoring is used to monitor your cardiac device from home. This monitoring is scheduled every month by our office. It allows us  to keep an eye on the function of your device to ensure it is working properly.       Signed: Nataley Bahri, DO Internal Medicine Resident, PGY-1 07/17/2024, 4:16 PM   Please contact the on call pager after 5 pm and on weekends at [phone].

## 2024-07-14 NOTE — Progress Notes (Deleted)
 HD#2 SUBJECTIVE:  Patient Summary: Roy [name] is a 53 y.o. with a pertinent PMH of HTN, HLD, bradycardia, prediabetes, nonobstructive coronary artery disease, cerebral aneurysm post coil, hematuria, and right-sided pleural effusion s/p thoracentesis who presented with hypotension and weakness and admitted for sepsis.   Overnight Events: none  Interim History: Patient evaluated at bedside. Reports mild headache this morning, similar to the prior two days. He denies SOB, fever, or chills. He works driving trucks typically M-F and is home on the weekend and wondering about clearance to return to driving at work.  OBJECTIVE:  Vital Signs: Vitals:   07/13/24 1826 07/13/24 2050 07/14/24 0550 07/14/24 0915  BP: 124/64 130/78 (!) 143/96 129/70  Pulse: 70 62 72 75  Resp:  18  19  Temp: 98.8 F (37.1 C) 98.6 F (37 C) 98.5 F (36.9 C) 98.7 F (37.1 C)  TempSrc: Oral  Oral   SpO2: 100%  99% 99%  Weight:      Height:       Supplemental O2: Room Air SpO2: 99 %  Filed Weights   07/12/24 0921  Weight: 111.1 kg     Intake/Output Summary (Last 24 hours) at 07/14/2024 1131 Last data filed at 07/14/2024 0900 Gross per 24 hour  Intake 2807.08 ml  Output 2400 ml  Net 407.08 ml   Net IO Since Admission: 1,127.68 mL [07/14/24 1131]  Physical Exam: Physical Exam Constitutional:      Appearance: Normal appearance.  HENT:     Head: Normocephalic and atraumatic.  Eyes:     Extraocular Movements: Extraocular movements intact.     Pupils: Pupils are equal, round, and reactive to light.  Cardiovascular:     Rate and Rhythm: Normal rate and regular rhythm.     Pulses: Normal pulses.  Pulmonary:     Effort: Pulmonary effort is normal.     Breath sounds: Normal breath sounds.  Abdominal:     General: Abdomen is flat.     Palpations: Abdomen is soft.  Musculoskeletal:        General: Normal range of motion.  Skin:    General: Skin is warm and dry.  Neurological:     General: No  focal deficit present.     Mental Status: He is alert and oriented to person, place, and time.  Psychiatric:        Mood and Affect: Mood normal.     Patient Lines/Drains/Airways Status     Active Line/Drains/Airways     Name Placement date Placement time Site Days   Peripheral IV 07/12/24 22 G Distal;Left;Posterior Wrist 07/12/24  1703  Wrist  1   Incision (Closed) 09/02/18 Chest Right 09/02/18  1320  -- 2141            Pertinent labs and imaging:      Latest Ref Rng & Units 07/14/2024    6:09 AM 07/13/2024    5:20 AM 07/12/2024    9:25 AM  CBC  WBC 4.0 - 10.5 K/uL 5.8  9.7  12.1   Hemoglobin 13.0 - 17.0 g/dL 89.6  9.6  86.5   Hematocrit 39.0 - 52.0 % 30.6  29.1  40.2   Platelets 150 - 400 K/uL 266  259  281        Latest Ref Rng & Units 07/14/2024    6:09 AM 07/13/2024    5:20 AM 07/12/2024    9:25 AM  CMP  Glucose 70 - 99 mg/dL 883  875  115   BUN 6 - 20 mg/dL 20  25  35   Creatinine 0.61 - 1.24 mg/dL 8.58  8.59  7.57   Sodium 135 - 145 mmol/L 141  139  139   Potassium 3.5 - 5.1 mmol/L 4.4  3.9  4.4   Chloride 98 - 111 mmol/L 105  105  103   CO2 22 - 32 mmol/L 27  27  25    Calcium  8.9 - 10.3 mg/dL 9.3  8.6  9.1   Total Protein 6.5 - 8.1 g/dL   6.6   Total Bilirubin 0.0 - 1.2 mg/dL   1.2   Alkaline Phos 38 - 126 U/L   44   AST 15 - 41 U/L   24   ALT 0 - 44 U/L   18     No results found.   ASSESSMENT/PLAN:  Assessment: Principal Problem:   Sepsis (HCC) Active Problems:   Essential hypertension   History of cerebral aneurysm repair   Nephrolithiasis   UTI (urinary tract infection)   AKI (acute kidney injury) (HCC)  Roy [name] is a 53 y.o. with a pertinent PMH of HTN, HLD, bradycardia, prediabetes, nonobstructive coronary artery disease, cerebral aneurysm post coil, hematuria, and right-sided pleural effusion s/p thoracentesis who presented with hypotension and weakness and admitted for sepsis.   Plan: #Sepsis UTI Patient hypotensive on  admission and received 1L LR + mIVF for resuscitation. BP improved, 143/96 currently while laying. Leukocytosis improved from 12.1 > 9.7 > 5.8 today.  Patient remains afebrile and denies chills. Hematuria improved and no noted blood in urine.  Preliminary blood culture showed no growth. Urine culture positive for gram negative rods, identified as citrobacter freundii. This is part of the enterobacter family, and is susceptible to fluoroquinolones. Will start patient on Levaquin 750mg  daily for 7 days. Patient condition improving overall. Discussed with patient the need for outpatient urology follow-up considering his history of kidney stones given his clinical improvement.  -Switch to PO Levaquin 750mg  daily for 7 days.  -PT/OT -Strict I&Os -Daily weights  #AKI vs CKD Creatinine on admission was elevated to 2.42. Baseline is 0.85-0.87 with most recent normal creatinine 1.22 in 03/2023. Creatinine today improved to 1.41. Patient noticed improvement of hematuria and did not note any gross hematuria.  -AM BMP  #Normocytic anemia Hemoglobin normal on admission, 13.4. Decreased to 9.6 yesterday following fluid resuscitation. Improving to 10.3 today. Hematuria noted on UA, visible to patient prior to admission which has now resolved. No additional source of bleeding. Appears that this may be chronic but we don't have recent levels. Continue to monitor.   #Hypertension Hypotension, resolved BP 143/96 today.  Patient has home medications of carvedilol  6.25 twice daily, losartan 50 mg, chlorthalidone 25 mg.  He also takes phentermine 30 mg daily for weight management. Additionally, he takes sildenafil and pregabalin.  Reports not taking the sildenafil very often and has no recent doses.  States he took his pregabalin 2 nights ago, but this is unlikely to have caused his initial hypotensive episodes.  Continue to monitor BP.  Will continue to hold his antihypertensives in his hypotensive setting and slowly add  back as he normalizes.  -Hold mIVF, encourage PO intake  #Prior Cerebral Aneurysm with coil placement Prior to arrival patient had right arm weakness and numbness that resolved with fluids. Could be due to vluneratble area aorund coil with global hypoperfusion causing similar effect as recrudescence. This is completely resolved and CT scan of the head  without contrast does not show any signs of intracranial bleed.  Patient notes a tension headache during hospitalization. MRI/MRA Brain is still pending, waiting to rule out any hypoperfusion that may have contributed to patient's weakness or changes in placement of his coil. -MRI/MRA pending -Compazine  for headache  #Prediabetes Chronic, stable. Last A1c 6.0% in 2021. New A1c 5.8%. Given stable, controlled A1c, initial kidney dysfunction less likely attributed to diabetic causes.  #Hyperlipidemia Chronic, stable. Last lipid panel 02/2023 shows total cholesterol 196, LDL 82, triglycerides 340. 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 Follow up with ENT outpatient but no signs of parotid gland dysfunction or dysphagia.  Best Practice: Diet: Regular diet IVF: Fluids: LR, Rate: 1L cc bolus VTE: enoxaparin  (LOVENOX ) injection 40 mg Start: 07/12/24 2200 Code: Full  Disposition planning: Therapy Recs: Home Family Contact: fiance and daughter DISPO: Anticipated discharge today to home pending MRI imaging.  Signature:  Ishana Blades [facility]  11:31 AM, 07/14/2024  On Call pager [phone]

## 2024-07-15 ENCOUNTER — Inpatient Hospital Stay (HOSPITAL_COMMUNITY)

## 2024-07-15 DIAGNOSIS — R29700 NIHSS score 0: Secondary | ICD-10-CM

## 2024-07-15 DIAGNOSIS — I6389 Other cerebral infarction: Secondary | ICD-10-CM

## 2024-07-15 LAB — BASIC METABOLIC PANEL WITH GFR
Anion gap: 12 (ref 5–15)
BUN: 19 mg/dL (ref 6–20)
CO2: 27 mmol/L (ref 22–32)
Calcium: 9.8 mg/dL (ref 8.9–10.3)
Chloride: 103 mmol/L (ref 98–111)
Creatinine, Ser: 1.27 mg/dL — ABNORMAL HIGH (ref 0.61–1.24)
GFR, Estimated: >60 mL/min (ref >60)
Glucose, Bld: 105 mg/dL — ABNORMAL HIGH (ref 70–99)
Potassium: 4.1 mmol/L (ref 3.5–5.1)
Sodium: 142 mmol/L (ref 135–145)

## 2024-07-15 LAB — ECHOCARDIOGRAM COMPLETE
AR max vel: 3.77 cm2
AV Area VTI: 3.92 cm2
AV Area mean vel: 3.6 cm2
AV Mean grad: 3 mmHg
AV Peak grad: 6.3 mmHg
Ao pk vel: 1.25 m/s
Area-P 1/2: 4.52 cm2
Height: 75 in
S' Lateral: 3.2 cm
Weight: 4098.79 [oz_av]

## 2024-07-15 LAB — CBC
HCT: 32.8 % — ABNORMAL LOW (ref 39.0–52.0)
Hemoglobin: 10.8 g/dL — ABNORMAL LOW (ref 13.0–17.0)
MCH: 32.3 pg (ref 26.0–34.0)
MCHC: 32.9 g/dL (ref 30.0–36.0)
MCV: 98.2 fL (ref 80.0–100.0)
Platelets: 319 K/uL (ref 150–400)
RBC: 3.34 MIL/uL — ABNORMAL LOW (ref 4.22–5.81)
RDW: 13.2 % (ref 11.5–15.5)
WBC: 5.1 K/uL (ref 4.0–10.5)
nRBC: 0 % (ref 0.0–0.2)

## 2024-07-15 LAB — RAPID URINE DRUG SCREEN, HOSP PERFORMED
Amphetamines: NOT DETECTED
Barbiturates: NOT DETECTED
Benzodiazepines: NOT DETECTED
Cocaine: NOT DETECTED
Opiates: NOT DETECTED
Tetrahydrocannabinol: POSITIVE — AB

## 2024-07-15 LAB — LIPID PANEL
Cholesterol: 196 mg/dL (ref 0–200)
HDL: 42 mg/dL (ref >40)
LDL Cholesterol: 128 mg/dL — ABNORMAL HIGH (ref 0–99)
Total CHOL/HDL Ratio: 4.7 ratio
Triglycerides: 129 mg/dL (ref <150)
VLDL: 26 mg/dL (ref 0–40)

## 2024-07-15 MED ORDER — IOHEXOL 350 MG/ML SOLN
75.0000 mL | Freq: Once | INTRAVENOUS | Status: AC | PRN
  Administered 2024-07-15: 75 mL via INTRAVENOUS

## 2024-07-15 MED ORDER — ATORVASTATIN CALCIUM 80 MG PO TABS
80.0000 mg | ORAL_TABLET | Freq: Every day | ORAL | Status: DC
  Administered 2024-07-15 – 2024-07-16 (×2): 80 mg via ORAL
  Filled 2024-07-15 (×2): qty 1

## 2024-07-15 NOTE — Progress Notes (Addendum)
 HD#2 SUBJECTIVE:  Patient Summary: Roy [name] is a 53 y.o. with a pertinent PMH of HTN, HLD, bradycardia, prediabetes, nonobstructive coronary artery disease, cerebral aneurysm post coil, hematuria, and right-sided pleural effusion s/p thoracentesis who presented with hypotension and weakness and admitted for sepsis.   Overnight Events: none  Interim History: Patient continues to do well without any new or worsening complaints.  No weakness or other focal deficits.  He does note that he remembers having some slow speech when he had his right arm weakness before arrival to the hospital which also resolved.  OBJECTIVE:  Vital Signs: Vitals:   07/14/24 1708 07/14/24 2038 07/15/24 0457 07/15/24 0500  BP: 137/88 131/78 (!) 134/97   Pulse:  67 68   Resp: 18 18 18    Temp: 99.2 F (37.3 C) 98.7 F (37.1 C) 97.7 F (36.5 C)   TempSrc:      SpO2:  98% 100%   Weight:    116.2 kg  Height:       Supplemental O2: Room Air SpO2: 100 %  Filed Weights   07/12/24 0921 07/15/24 0500  Weight: 111.1 kg 116.2 kg     Intake/Output Summary (Last 24 hours) at 07/15/2024 0619 Last data filed at 07/15/2024 0552 Gross per 24 hour  Intake 780 ml  Output 600 ml  Net 180 ml   Net IO Since Admission: 1,067.68 mL [07/15/24 0619]  Physical Exam: Constitutional:well appearing middle aged male laying in bed. In no acute distress. Pulm: Normal work of breathing on room air. FDX:Wzhjupcz for extremity edema. Skin:Warm and dry. Neuro:Alert and oriented x3.   Patient Lines/Drains/Airways Status     Active Line/Drains/Airways     Name Placement date Placement time Site Days   Peripheral IV 07/12/24 22 G Distal;Left;Posterior Wrist 07/12/24  1703  Wrist  1   Incision (Closed) 09/02/18 Chest Right 09/02/18  1320  -- 2141            Pertinent labs and imaging:      Latest Ref Rng & Units 07/14/2024    6:09 AM 07/13/2024    5:20 AM 07/12/2024    9:25 AM  CBC  WBC 4.0 - 10.5 K/uL 5.8  9.7   12.1   Hemoglobin 13.0 - 17.0 g/dL 89.6  9.6  86.5   Hematocrit 39.0 - 52.0 % 30.6  29.1  40.2   Platelets 150 - 400 K/uL 266  259  281        Latest Ref Rng & Units 07/14/2024    6:09 AM 07/13/2024    5:20 AM 07/12/2024    9:25 AM  CMP  Glucose 70 - 99 mg/dL 883  875  884   BUN 6 - 20 mg/dL 20  25  35   Creatinine 0.61 - 1.24 mg/dL 8.58  8.59  7.57   Sodium 135 - 145 mmol/L 141  139  139   Potassium 3.5 - 5.1 mmol/L 4.4  3.9  4.4   Chloride 98 - 111 mmol/L 105  105  103   CO2 22 - 32 mmol/L 27  27  25    Calcium  8.9 - 10.3 mg/dL 9.3  8.6  9.1   Total Protein 6.5 - 8.1 g/dL   6.6   Total Bilirubin 0.0 - 1.2 mg/dL   1.2   Alkaline Phos 38 - 126 U/L   44   AST 15 - 41 U/L   24   ALT 0 - 44 U/L   18  MR BRAIN WO CONTRAST Result Date: 07/14/2024 EXAM: MRI BRAIN WITHOUT CONTRAST 07/14/2024 01:59:29 PM TECHNIQUE: Multiplanar multisequence MRI of the head/brain was performed without the administration of intravenous contrast. COMPARISON: CT of the head dated 07/12/2024. CLINICAL HISTORY: Hx of left PCA with coil. R/O aneurysm or new coil movement. FINDINGS: BRAIN AND VENTRICLES: Scattered foci of restricted diffusion are present within the left cerebral hemisphere, including the precentral and postcentral gyri and the occipital lobe. Numerous scattered foci of increased T2 signal are present within the subcortical and deep cerebral white matter. No intracranial hemorrhage. No mass. No midline shift. No hydrocephalus. The sella is unremarkable. Normal flow voids. ORBITS: Disconjugate gaze. SINUSES AND MASTOIDS: No acute abnormality. BONES AND SOFT TISSUES: A cystic lesion is noted within the right parotid measuring approximately 2 cm in diameter, as seen on the previous study. IMPRESSION: 1. Scattered foci of restricted diffusion within the left cerebral hemisphere, including the precentral and postcentral gyri and the occipital lobe, which are compatible with acute Embolic Nonhemorrhagic infarcts.  2. Numerous scattered foci of increased T2 signal within the subcortical and deep cerebral white matter. 3. Disconjugate gaze. 4. 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 Cystic neoplasm remains a diagnosis of exclusion. Electronically signed by: evalene coho 07/14/2024 05:36 PM EDT RP Workstation: [id number]   MR ANGIO HEAD WO CONTRAST Result Date: 07/14/2024 EXAM: MR Angiography Head without intravenous Contrast. 07/14/2024 01:59:29 PM TECHNIQUE: Magnetic resonance angiography images of the head without intravenous contrast. Multiplanar 2D and 3D reformatted images are provided for review. COMPARISON: None provided. CLINICAL HISTORY: History of left PCA with coil. Rule out aneurysm or new coil movement. FINDINGS: ANTERIOR CIRCULATION: There is mild stenosis of the anterior genua of the cavernous internal carotid arteries bilaterally. There is no flow-limiting stenosis. No significant stenosis of the anterior cerebral arteries. No significant stenosis of the middle cerebral arteries. No aneurysm. POSTERIOR CIRCULATION: No significant stenosis of the posterior cerebral arteries. No significant stenosis of the basilar artery. No significant stenosis of the vertebral arteries. No aneurysm. IMPRESSION: 1. No evidence of aneurysm or new coil movement in the left PCA. 2. Mild stenosis of the anterior Genua of the cavernous internal carotid arteries bilaterally, without flow-limiting stenosis. Electronically signed by: evalene coho 07/14/2024 03:01 PM EDT RP Workstation: [id number]    ASSESSMENT/PLAN:  Assessment: Principal Problem:   Sepsis (HCC) Active Problems:   Essential hypertension   History of cerebral aneurysm repair   Nephrolithiasis   UTI (urinary tract infection)   AKI (acute kidney injury) (HCC)   Cerebrovascular accident (CVA) due to embolism of cerebral artery (HCC)  Roy [name] is a 53 y.o. with a  pertinent PMH of HTN, HLD, bradycardia, prediabetes, nonobstructive coronary artery disease, cerebral aneurysm post coil, hematuria, and right-sided pleural effusion s/p thoracentesis who presented with hypotension and weakness and admitted for sepsis due to Citrobacter UTI and found to have acute left cerebellar infarcts.  Plan: Acute left cerebral infarcts, likely embolic Further evaluation shows no abnormal heart rhythms on telemetry and no known history of A-fib.  LDL elevated at 128. - TTE, CTA head and neck, lower extremity venous Dopplers - Increase atorvastatin  to 80 mg daily and continue aspirin  81 mg daily, may be able to add Plavix  if hemoglobin stable tomorrow - He may need a 30-day heart monitor on discharge  Citrobacter freundii UTI Bilateral nonobstructing renal stones Stable without return of any urinary symptoms or flank/groin pain. -  Levofloxacin  750 mg daily for 7 days - Urology follow-up outpatient  #AKI vs CKD Creatinine down to 1.27 today.  Expect this might rise after contrasted CT so we will hold off on adding ARB or thiazide for blood pressure at this time.   #Normocytic anemia Stable with a hemoglobin of 10.8.  No signs of active bleeding.  #Hypertension Hypotension, resolved Remains normotensive to slightly hypertensive off carvedilol , chlorthalidone, and losartan.  As above we will hold off on adding ARB or thiazide with contrasted CT scan.  If blood pressure continues to rise we will add hydralazine  while inpatient but ultimately plan to add back ARB/thiazide preferentially.  #Prior Cerebral Aneurysm with coil placement MRA to evaluate left PCA aneurysm coil showed no changes.  #Prediabetes Chronic, stable. Last A1c 6.0% in 2021.  A1c here 5.8.  #Hyperlipidemia Increase atorvastatin  as above.  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 Follow up with ENT outpatient but no signs of parotid gland dysfunction or  dysphagia.  MRI shows unchanged from CT on admission and will need ENT follow-up.  Best Practice: Diet: Regular diet IVF: None VTE: enoxaparin  (LOVENOX ) injection 40 mg Start: 07/12/24 2200 Code: Full  Disposition planning: Therapy Recs: Home Family Contact: fiance and daughter DISPO: Anticipated discharge in 1-2 days pending stroke workup.  Signature:  Fairy Jolaine [facility]  6:19 AM, 07/15/2024  On Call pager [phone]

## 2024-07-15 NOTE — H&P (View-Only) (Signed)
 STROKE TEAM PROGRESS NOTE   SUBJECTIVE (INTERVAL HISTORY) No family is at the bedside.  Overall his condition is completely resolved.  Patient stated that he is at baseline now.  MRI showed left MCA scattered infarcts.   OBJECTIVE Temp:  [97.7 F (36.5 C)-98.7 F (37.1 C)] 98.3 F (36.8 C) (08/02 1613) Pulse Rate:  [64-76] 76 (08/02 1613) Cardiac Rhythm: Sinus bradycardia (08/02 0700) Resp:  [18-19] 19 (08/02 1613) BP: (131-142)/(78-97) 136/95 (08/02 1613) SpO2:  [98 %-100 %] 99 % (08/02 1613) Weight:  [116.2 kg] 116.2 kg (08/02 0500)  Recent Labs  Lab 07/12/24 0919  GLUCAP 132*   Recent Labs  Lab 07/12/24 0925 07/13/24 0520 07/14/24 0609 07/15/24 0603  NA 139 139 141 142  K 4.4 3.9 4.4 4.1  CL 103 105 105 103  CO2 25 27 27 27   GLUCOSE 115* 124* 116* 105*  BUN 35* 25* 20 19  CREATININE 2.42* 1.40* 1.41* 1.27*  CALCIUM  9.1 8.6* 9.3 9.8   Recent Labs  Lab 07/12/24 0925  AST 24  ALT 18  ALKPHOS 44  BILITOT 1.2  PROT 6.6  ALBUMIN 3.4*   Recent Labs  Lab 07/12/24 0925 07/13/24 0520 07/14/24 0609 07/15/24 0603  WBC 12.1* 9.7 5.8 5.1  NEUTROABS 11.0*  --  3.5  --   HGB 13.4 9.6* 10.3* 10.8*  HCT 40.2 29.1* 30.6* 32.8*  MCV 99.8 99.3 99.0 98.2  PLT 281 259 266 319   No results for input(s): CKTOTAL, CKMB, CKMBINDEX, TROPONINI in the last 168 hours. No results for input(s): LABPROT, INR in the last 72 hours. No results for input(s): COLORURINE, LABSPEC, PHURINE, GLUCOSEU, HGBUR, BILIRUBINUR, KETONESUR, PROTEINUR, UROBILINOGEN, NITRITE, LEUKOCYTESUR in the last 72 hours.  Invalid input(s): APPERANCEUR     Component Value Date/Time   CHOL 196 07/15/2024 0603   TRIG 129 07/15/2024 0603   HDL 42 07/15/2024 0603   CHOLHDL 4.7 07/15/2024 0603   VLDL 26 07/15/2024 0603   LDLCALC 128 (H) 07/15/2024 0603   Lab Results  Component Value Date   HGBA1C 5.8 (H) 07/13/2024      Component Value Date/Time   LABOPIA NONE  DETECTED 07/15/2024 0841   COCAINSCRNUR NONE DETECTED 07/15/2024 0841   COCAINSCRNUR NONE DETECTED 08/17/2018 1635   LABBENZ NONE DETECTED 07/15/2024 0841   AMPHETMU NONE DETECTED 07/15/2024 0841   THCU POSITIVE (A) 07/15/2024 0841   LABBARB NONE DETECTED 07/15/2024 0841    No results for input(s): ETH in the last 168 hours.  I have personally reviewed the radiological images below and agree with the radiology interpretations.  CT ANGIO HEAD NECK W WO CM Result Date: 07/15/2024 EXAM: CTA HEAD AND NECK WITH AND WITHOUT 07/15/2024 03:06:50 PM TECHNIQUE: CTA of the head and neck was performed with and without the administration of intravenous contrast. Multiplanar 2D and/or 3D reformatted images are provided for review. Automated exposure control, iterative reconstruction, and/or weight based adjustment of the mA/kV was utilized to reduce the radiation dose to as low as reasonably achievable. Stenosis of the internal carotid arteries measured using NASCET criteria. COMPARISON: MRI of the head dated 09/13/2024, MRA of the head dated 07/14/2024, and CT of the head dated 07/12/2024. CLINICAL HISTORY: Stroke/TIA, determine embolic source. Chief complaints; Hypotension. FINDINGS: CTA NECK: AORTIC ARCH AND ARCH VESSELS: No dissection or arterial injury. No significant stenosis of the brachiocephalic or subclavian arteries. CERVICAL CAROTID ARTERIES: No dissection, arterial injury, or hemodynamically significant stenosis by NASCET criteria. CERVICAL VERTEBRAL ARTERIES: No dissection, arterial injury, or  significant stenosis. LUNGS AND MEDIASTINUM: Unremarkable. 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 BONES: No acute abnormality. CTA HEAD: ANTERIOR CIRCULATION: No significant stenosis of the internal carotid arteries. Endovascular coil is again demonstrated adjacent to the supraclinoid segment of the left internal carotid artery. No residual or recurrent cerebral aneurysm  present. POSTERIOR CIRCULATION: The left posterior communicating artery is patent. There is mild stenosis of the left P1 and P2 segments but they are patent. There is a complete Circle of Willis. OTHER: No dural venous sinus thrombosis on this non-dedicated study. IMPRESSION: 1. No large vessel occlusion, hemodynamically significant stenosis, or aneurysm in the head or neck. 2. Mild stenosis of the left P1 and P2 segments. Electronically signed by: evalene coho 07/15/2024 04:42 PM EDT RP Workstation: [id number]   ECHOCARDIOGRAM COMPLETE Result Date: 07/15/2024    ECHOCARDIOGRAM REPORT   Patient Name:   Roy [name] Date of Exam: 07/15/2024 Medical Rec #:  [id number]         Height:       75.0 in Accession #:    [id number]        Weight:       256.2 lb Date of Birth:  1971/06/17        BSA:          2.438 m Patient Age:    52 years          BP:           142/90 mmHg Patient Gender: M                 HR:           53 bpm. Exam Location:  Inpatient Procedure: 2D Echo, Cardiac Doppler, Color Doppler and Saline Contrast Bubble            Study (Both Spectral and Color Flow Doppler were utilized during            procedure). Indications:    CVA  History:        Patient has prior history of Echocardiogram examinations, most                 recent 08/18/2018. Stroke; Risk Factors:Hypertension and Current                 Smoker.  Sonographer:    Therisa Crouch Referring Phys: [id number] GRACE LAU IMPRESSIONS  1. Left ventricular ejection fraction, by estimation, is 60 to 65%. The left ventricle has normal function. The left ventricle has no regional wall motion abnormalities. Left ventricular diastolic parameters were normal.  2. Right ventricular systolic function is normal. The right ventricular size is normal.  3. Left atrial size was mildly dilated.  4. The mitral valve is abnormal. Trivial mitral valve regurgitation. No evidence of mitral stenosis.  5. The aortic valve is tricuspid. There is mild calcification of the  aortic valve. There is mild thickening of the aortic valve. Aortic valve regurgitation is not visualized. Aortic valve sclerosis is present, with no evidence of aortic valve stenosis.  6. The inferior vena cava is normal in size with greater than 50% respiratory variability, suggesting right atrial pressure of 3 mmHg.  7. Agitated saline contrast bubble study was negative, with no evidence of any interatrial shunt. FINDINGS  Left Ventricle: Left ventricular ejection fraction, by estimation, is 60 to 65%. The left ventricle has normal function. The left ventricle has no regional wall motion abnormalities. Strain was performed and the  global longitudinal strain is indeterminate. The left ventricular internal cavity size was normal in size. There is no left ventricular hypertrophy. Left ventricular diastolic parameters were normal. Right Ventricle: The right ventricular size is normal. No increase in right ventricular wall thickness. Right ventricular systolic function is normal. Left Atrium: Left atrial size was mildly dilated. Right Atrium: Right atrial size was normal in size. Pericardium: There is no evidence of pericardial effusion. Mitral Valve: The mitral valve is abnormal. There is mild thickening of the mitral valve leaflet(s). Trivial mitral valve regurgitation. No evidence of mitral valve stenosis. Tricuspid Valve: The tricuspid valve is normal in structure. Tricuspid valve regurgitation is trivial. No evidence of tricuspid stenosis. Aortic Valve: The aortic valve is tricuspid. There is mild calcification of the aortic valve. There is mild thickening of the aortic valve. Aortic valve regurgitation is not visualized. Aortic valve sclerosis is present, with no evidence of aortic valve stenosis. Aortic valve mean gradient measures 3.0 mmHg. Aortic valve peak gradient measures 6.2 mmHg. Aortic valve area, by VTI measures 3.92 cm. Pulmonic Valve: The pulmonic valve was normal in structure. Pulmonic valve  regurgitation is not visualized. No evidence of pulmonic stenosis. Aorta: The aortic root is normal in size and structure. Venous: The inferior vena cava is normal in size with greater than 50% respiratory variability, suggesting right atrial pressure of 3 mmHg. IAS/Shunts: No atrial level shunt detected by color flow Doppler. Agitated saline contrast was given intravenously to evaluate for intracardiac shunting. Agitated saline contrast bubble study was negative, with no evidence of any interatrial shunt. Additional Comments: 3D was performed not requiring image post processing on an independent workstation and was indeterminate.  LEFT VENTRICLE PLAX 2D LVIDd:         5.40 cm   Diastology LVIDs:         3.20 cm   LV e' medial:    8.70 cm/s LV PW:         1.30 cm   LV E/e' medial:  9.6 LV IVS:        1.00 cm   LV e' lateral:   11.00 cm/s LVOT diam:     2.20 cm   LV E/e' lateral: 7.6 LV SV:         101 LV SV Index:   42 LVOT Area:     3.80 cm  RIGHT VENTRICLE             IVC RV Basal diam:  3.40 cm     IVC diam: 1.70 cm RV S prime:     17.80 cm/s TAPSE (M-mode): 3.0 cm LEFT ATRIUM             Index LA diam:        4.00 cm 1.64 cm/m LA Vol (A2C):   54.1 ml 22.19 ml/m LA Vol (A4C):   50.3 ml 20.63 ml/m LA Biplane Vol: 54.7 ml 22.44 ml/m  AORTIC VALVE AV Area (Vmax):    3.77 cm AV Area (Vmean):   3.60 cm AV Area (VTI):     3.92 cm AV Vmax:           125.00 cm/s AV Vmean:          81.400 cm/s AV VTI:            0.259 m AV Peak Grad:      6.2 mmHg AV Mean Grad:      3.0 mmHg LVOT Vmax:         124.00  cm/s LVOT Vmean:        77.000 cm/s LVOT VTI:          0.267 m LVOT/AV VTI ratio: 1.03  AORTA Ao Root diam: 3.60 cm Ao Asc diam:  3.30 cm MITRAL VALVE MV Area (PHT): 4.52 cm    SHUNTS MV Decel Time: 168 msec    Systemic VTI:  0.27 m MV E velocity: 83.30 cm/s  Systemic Diam: 2.20 cm MV A velocity: 62.40 cm/s MV E/A ratio:  1.33 Maude Emmer MD Electronically signed by Maude Emmer MD Signature Date/Time:  07/15/2024/11:44:48 AM    Final    MR BRAIN WO CONTRAST Result Date: 07/14/2024 EXAM: MRI BRAIN WITHOUT CONTRAST 07/14/2024 01:59:29 PM TECHNIQUE: Multiplanar multisequence MRI of the head/brain was performed without the administration of intravenous contrast. COMPARISON: CT of the head dated 07/12/2024. CLINICAL HISTORY: Hx of left PCA with coil. R/O aneurysm or new coil movement. FINDINGS: BRAIN AND VENTRICLES: Scattered foci of restricted diffusion are present within the left cerebral hemisphere, including the precentral and postcentral gyri and the occipital lobe. Numerous scattered foci of increased T2 signal are present within the subcortical and deep cerebral white matter. No intracranial hemorrhage. No mass. No midline shift. No hydrocephalus. The sella is unremarkable. Normal flow voids. ORBITS: Disconjugate gaze. SINUSES AND MASTOIDS: No acute abnormality. BONES AND SOFT TISSUES: A cystic lesion is noted within the right parotid measuring approximately 2 cm in diameter, as seen on the previous study. IMPRESSION: 1. Scattered foci of restricted diffusion within the left cerebral hemisphere, including the precentral and postcentral gyri and the occipital lobe, which are compatible with acute Embolic Nonhemorrhagic infarcts. 2. Numerous scattered foci of increased T2 signal within the subcortical and deep cerebral white matter. 3. Disconjugate gaze. 4. 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 Cystic neoplasm remains a diagnosis of exclusion. Electronically signed by: evalene coho 07/14/2024 05:36 PM EDT RP Workstation: [id number]   MR ANGIO HEAD WO CONTRAST Result Date: 07/14/2024 EXAM: MR Angiography Head without intravenous Contrast. 07/14/2024 01:59:29 PM TECHNIQUE: Magnetic resonance angiography images of the head without intravenous contrast. Multiplanar 2D and 3D reformatted images are provided for review. COMPARISON:  None provided. CLINICAL HISTORY: History of left PCA with coil. Rule out aneurysm or new coil movement. FINDINGS: ANTERIOR CIRCULATION: There is mild stenosis of the anterior genua of the cavernous internal carotid arteries bilaterally. There is no flow-limiting stenosis. No significant stenosis of the anterior cerebral arteries. No significant stenosis of the middle cerebral arteries. No aneurysm. POSTERIOR CIRCULATION: No significant stenosis of the posterior cerebral arteries. No significant stenosis of the basilar artery. No significant stenosis of the vertebral arteries. No aneurysm. IMPRESSION: 1. No evidence of aneurysm or new coil movement in the left PCA. 2. Mild stenosis of the anterior Genua of the cavernous internal carotid arteries bilaterally, without flow-limiting stenosis. Electronically signed by: evalene coho 07/14/2024 03:01 PM EDT RP Workstation: [id number]   CT Renal Stone Study Result Date: 07/12/2024 CLINICAL DATA:  Abdominal/flank pain, stone suspected EXAM: CT ABDOMEN AND PELVIS WITHOUT CONTRAST TECHNIQUE: Multidetector CT imaging of the abdomen and pelvis was performed following the standard protocol without IV contrast. RADIATION DOSE REDUCTION: This exam was performed according to the departmental dose-optimization program which includes automated exposure control, adjustment of the mA and/or kV according to patient size and/or use of iterative reconstruction technique. COMPARISON:  None Available. FINDINGS: Lower chest: No acute abnormality. Hepatobiliary: Scattered subcentimeter focal hypodensities in  the right and left hepatic lobes are too small to definitively characterize and may represent small cysts. Gallbladder is unremarkable. No biliary dilatation. Pancreas: Unremarkable. No pancreatic ductal dilatation or surrounding inflammatory changes. Spleen: Normal in size without focal abnormality. Adrenals/Urinary Tract: Adrenal glands are unremarkable. Bilateral renal cysts with  the largest measuring up to 2.1 cm arising from the superior pole of the right kidney. Nonobstructive 10 mm calculus in the inferior pole of the right kidney. Nonobstructive 5 mm calculus in the inferior pole of the left kidney. No ureteral calculi. No hydronephrosis. Bladder is unremarkable. Stomach/Bowel: Stomach is within normal limits. Appendix appears normal. No evidence of bowel wall thickening, distention, or inflammatory changes. Vascular/Lymphatic: Aortic atherosclerosis. Nonaneurysmal abdominal aorta. No enlarged abdominal or pelvic lymph nodes. Reproductive: Prostate is unremarkable. Other: Small fat containing paraumbilical hernia. No abdominopelvic ascites. No intraperitoneal free air. Musculoskeletal: No acute osseous abnormality. No suspicious osseous lesion. Degenerative disc height loss and endplate osteophytosis at L4-L5 and L5-S1. IMPRESSION: 1. Nonobstructive bilateral renal calculi measuring up to 10 mm on the right. No ureteral calculi. No hydronephrosis. 2.  Aortic Atherosclerosis (ICD10-I70.0). Electronically Signed   By: Harrietta Sherry M.D.   On: 07/12/2024 12:36   CT Head Wo Contrast Result Date: 07/12/2024 CLINICAL DATA:  53 year old male with headache and right side numbness. Dizziness, syncope. Hypotensive. EXAM: CT HEAD WITHOUT CONTRAST TECHNIQUE: Contiguous axial images were obtained from the base of the skull through the vertex without intravenous contrast. RADIATION DOSE REDUCTION: This exam was performed according to the departmental dose-optimization program which includes automated exposure control, adjustment of the mA and/or kV according to patient size and/or use of iterative reconstruction technique. COMPARISON:  None Available. FINDINGS: Brain: Cerebral volume is normal for age. No midline shift, ventriculomegaly, mass effect, evidence of mass lesion, intracranial hemorrhage or evidence of cortically based acute infarction. Gray-white matter differentiation is within  normal limits throughout the brain. Vascular: A distal left ICA siphon vascular stent is visible. No suspicious intracranial vascular hyperdensity. Skull: Intact.  No acute osseous abnormality identified. Sinuses/Orbits: Visualized paranasal sinuses and mastoids are well aerated. Tympanic cavities appear clear. Other: Mildly Disconjugate gaze. Negative visible scalp soft tissues. Partially visible round 2.5 cm cystic appearing mass in the right parotid space, series 3, image 1. no regional inflammation. IMPRESSION: 1. Distal left ICA siphon vascular stent is visible. Normal for age noncontrast CT appearance of the Brain. 2. Partially visible 2.5 cm cystic appearing mass within the right parotid space. 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 Electronically Signed   By: VEAR Hurst M.D.   On: 07/12/2024 10:19   DG Chest Port 1 View Result Date: 07/12/2024 CLINICAL DATA:  Syncope, hypotension EXAM: PORTABLE CHEST 1 VIEW COMPARISON:  10/25/2018 and CT chest 03/18/2021 FINDINGS: Mid and lower thoracic spondylosis. Heart size within normal limits. Upper zone pulmonary vascular prominence may be due to supine positioning rather than pulmonary venous hypertension. Left costophrenic angle excluded. The included lungs appear clear. IMPRESSION: 1. Upper zone pulmonary vascular prominence may be due to supine positioning rather than pulmonary venous hypertension. 2. Mid and lower thoracic spondylosis. Electronically Signed   By: Ryan Salvage M.D.   On: 07/12/2024 09:48     PHYSICAL EXAM  Temp:  [97.7 F (36.5 C)-98.7 F (37.1 C)] 98.3 F (36.8 C) (08/02 1613) Pulse Rate:  [64-76] 76 (08/02 1613) Resp:  [18-19] 19 (08/02 1613) BP: (131-142)/(78-97) 136/95 (08/02 1613) SpO2:  [98 %-100 %] 99 % (08/02 1613) Weight:  [883.7 kg] 116.2 kg (  08/02 0500)  General - Well nourished, well developed, in no apparent distress.  Ophthalmologic - fundi not visualized due to noncooperation.  Cardiovascular - Regular rhythm  and rate.  Mental Status -  Level of arousal and orientation to time, place, and person were intact. Language including expression, naming, repetition, comprehension was assessed and found intact. Attention span and concentration were normal. Fund of Knowledge was assessed and was intact.  Cranial Nerves II - XII - II - Visual field intact OU. III, IV, VI - Extraocular movements intact. V - Facial sensation intact bilaterally. VII - Facial movement intact bilaterally. VIII - Hearing & vestibular intact bilaterally. X - Palate elevates symmetrically. XI - Chin turning & shoulder shrug intact bilaterally. XII - Tongue protrusion intact.  Motor Strength - The patient's strength was normal in all extremities and pronator drift was absent.  Bulk was normal and fasciculations were absent.   Motor Tone - Muscle tone was assessed at the neck and appendages and was normal.  Reflexes - The patient's reflexes were symmetrical in all extremities and he had no pathological reflexes.  Sensory - Light touch, temperature/pinprick were assessed and were symmetrical.    Coordination - The patient had normal movements in the hands and feet with no ataxia or dysmetria.  Tremor was absent.  Gait and Station - deferred.   ASSESSMENT/PLAN Roy [name] is a 53 y.o. male with history of hypertension, hyperlipidemia, CAD, smoker, left P-comm aneurysm status post coil 2021 admitted for chills, malaise, urinary frequency, right arm weakness and numbness.  EEG had syncope or episode with bradycardia and BP as low as 55/43, received IV fluid.  Found to have AKI, UTI and sepsis.  No TNK given due to outside window.    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  Will start Plavix  once hemoglobin stabilized Patient counseled to be compliant with his antithrombotic medications Ongoing aggressive stroke risk factor management Therapy recommendations: None Disposition: Pending  History of hypertension Hypotension with syncope BP now stable Avoid low BP Long term BP goal normotensive  Hyperlipidemia Home meds: Lipitor 40 LDL 128, goal < 70 Now on Lipitor 80 Continue statin at discharge  UTI Kidney stone AKI UA WBC more than 50 Urine culture Citrobacter freundii Creatinine 1.40-1.27 CT renal stone study showed nonobstructive bilateral renal calculi measuring up to 10 mm on the right On Levaquin  Management per primary team  Tobacco abuse Current smoker Smoking cessation counseling provided Pt is willing to quit  Anemia Hemoglobin 13.4--9.6--10.3--10.8 CBC monitoring Will start Plavix  once hemoglobin stabilized  Other Stroke Risk Factors THC abuse, UDS positive for THC, cessation education provided Obesity, Body mass index is 32.02 kg/m.  Coronary artery disease  Other Active Problems Left P-comm aneurysm status post coil in 2021 at [facility] day # 2  I spent additional 30 minutes in patient face-to-face time with the patient, reviewing test results, images and medication, and discussing the diagnosis, treatment plan and potential prognosis. This patient's care requiresreview of multiple databases, neurological assessment, discussion with family, other specialists and medical decision making of high complexity.  Ary Cummins, MD PhD Stroke Neurology 07/15/2024 7:00 PM    To contact Stroke Continuity provider, please refer to [email or web address]. After hours, contact General Neurology

## 2024-07-15 NOTE — Consult Note (Signed)
 NEUROLOGY CONSULT NOTE   Date of service: July 15, 2024 Patient Name: Roy [name] MRN:  [id number] DOB:  [birth year] Chief Complaint: Right arm weakness Requesting Provider: Karna Fellows, MD  History of Present Illness  Roy [name] is a 53 y.o. male with hx of who is feeling off on the morning of 7/30 with chills.  He came into the emergency department when he started noticing that his right arm was not working properly.  He tried to do some things in his truck, and found that he was unable to do them.  He came into the emergency department where he passed out and was found to have a systolic of 55.  LKW: 7/30, in the morning Modified rankin score: 0-Completely asymptomatic and back to baseline post- stroke IV Thrombolysis: No, in acute shock on arrival, medically contraindicated EVT: no, no lvo   NIHSS components Score: Comment  1a Level of Conscious 0[]  1[]  2[]  3[]      1b LOC Questions 0[]  1[]  2[]       1c LOC Commands 0[]  1[]  2[]       2 Best Gaze 0[]  1[]  2[]       3 Visual 0[]  1[]  2[]  3[]      4 Facial Palsy 0[]  1[]  2[]  3[]      5a Motor Arm - left 0[]  1[]  2[]  3[]  4[]  UN[]    5b Motor Arm - Right 0[]  1[]  2[]  3[]  4[]  UN[]    6a Motor Leg - Left 0[]  1[]  2[]  3[]  4[]  UN[]    6b Motor Leg - Right 0[]  1[]  2[]  3[]  4[]  UN[]    7 Limb Ataxia 0[]  1[]  2[]  UN[]      8 Sensory 0[]  1[]  2[]  UN[]      9 Best Language 0[]  1[]  2[]  3[]      10 Dysarthria 0[]  1[]  2[]  UN[]      11 Extinct. and Inattention 0[]  1[]  2[]       TOTAL: 0       Past History   Past Medical History:  Diagnosis Date   Aneurysm (HCC)    Cardiomyopathy (HCC)    pt sts he no longer has it   Empyema of pleural space (HCC) 08/2018   Hypertension    Renal calculi    Tobacco abuse     Past Surgical History:  Procedure Laterality Date   ANEURYSM COILING     a few years ago   DECORTICATION Right 09/02/2018   Procedure: DECORTICATION;  Surgeon: Kerrin Elspeth BROCKS, MD;  Location: [facility] OR;  Service: Thoracic;   Laterality: Right;   PLEURAL EFFUSION DRAINAGE Right 09/02/2018   Procedure: DRAINAGE OF PLEURAL EFFUSION;  Surgeon: Kerrin Elspeth BROCKS, MD;  Location: [facility] OR;  Service: Thoracic;  Laterality: Right;   VIDEO ASSISTED THORACOSCOPY (VATS)/THOROCOTOMY Right 09/02/2018   Procedure: VIDEO ASSISTED THORACOSCOPY (VATS)/possible THOROCOTOMY;  Surgeon: Kerrin Elspeth BROCKS, MD;  Location: [facility] OR;  Service: Thoracic;  Laterality: Right;    Family History: Family History  Problem Relation Age of Onset   Lung cancer Father    Throat cancer Father     Social History  reports that he has quit smoking. His smoking use included cigarettes. He has never used smokeless tobacco. He reports that he does not currently use drugs after having used the following drugs: Marijuana. No history on file for alcohol use.  No Known Allergies  Medications   Current Facility-Administered Medications:    acetaminophen  (TYLENOL ) tablet 1,000 mg, 1,000 mg, Oral, Q6H PRN, Jolaine Pac, DO, 1,000 mg at 07/13/24 2132  albuterol  (PROVENTIL ) (2.5 MG/3ML) 0.083% nebulizer solution 3 mL, 3 mL, Inhalation, Q6H PRN, Jolaine Pac, DO   aspirin  EC tablet 81 mg, 81 mg, Oral, Daily, Jolaine Pac, DO, 81 mg at 07/14/24 2003   atorvastatin  (LIPITOR) tablet 40 mg, 40 mg, Oral, QHS, Jolaine Pac, DO, 40 mg at 07/14/24 2208   budesonide -glycopyrrolate -formoterol  (BREZTRI ) 160-9-4.8 MCG/ACT inhaler 2 puff, 2 puff, Inhalation, BID, Jolaine Pac, DO, 2 puff at 07/14/24 2022   enoxaparin  (LOVENOX ) injection 40 mg, 40 mg, Subcutaneous, Q24H, Jolaine Pac, DO, 40 mg at 07/14/24 2208   hydrOXYzine  (ATARAX ) tablet 25 mg, 25 mg, Oral, TID PRN, Jolaine Pac, DO, 25 mg at 07/14/24 2208   levofloxacin  (LEVAQUIN ) tablet 750 mg, 750 mg, Oral, Daily, Amilibia, Jaden, DO, 750 mg at 07/14/24 1220   polyethylene glycol (MIRALAX  / GLYCOLAX ) packet 17 g, 17 g, Oral, Daily, Amilibia, Jaden, DO, 17 g at 07/14/24 9073   senna-docusate  (Senokot-S) tablet 2 tablet, 2 tablet, Oral, BID, Nguyen, Diana, MD, 2 tablet at 07/14/24 2208  Vitals   Vitals:   07/14/24 1708 07/14/24 2038 07/15/24 0457 07/15/24 0500  BP: 137/88 131/78 (!) 134/97   Pulse:  67 68   Resp: 18 18 18    Temp: 99.2 F (37.3 C) 98.7 F (37.1 C) 97.7 F (36.5 C)   TempSrc:      SpO2:  98% 100%   Weight:    116.2 kg  Height:        Body mass index is 32.02 kg/m.   Physical Exam   Constitutional: Appears well-developed and well-nourished.   Neurologic Examination    Neuro: Mental Status: Patient is awake, alert, oriented to person, place, month, year, and situation. Patient is able to give a clear and coherent history. No signs of aphasia or neglect Cranial Nerves: II: Visual Fields are full. Pupils are equal, round, and reactive to light.   III,IV, VI: EOMI without ptosis or diploplia.  V: Facial sensation is symmetric to temperature VII: Facial movement is symmetric.  VIII: hearing is intact to voice X: Uvula elevates symmetrically XII: tongue is midline without atrophy or fasciculations.  Motor: Tone is normal. Bulk is normal. 5/5 strength was present in all four extremities.  Sensory: Sensation is symmetric to light touch and temperature in the arms and legs. Cerebellar: FNF and HKS are intact bilaterally        Labs/Imaging/Neurodiagnostic studies   CBC:  Recent Labs  Lab 2024/08/06 0925 07/13/24 0520 07/14/24 0609  WBC 12.1* 9.7 5.8  NEUTROABS 11.0*  --  3.5  HGB 13.4 9.6* 10.3*  HCT 40.2 29.1* 30.6*  MCV 99.8 99.3 99.0  PLT 281 259 266   Basic Metabolic Panel:  Lab Results  Component Value Date   NA 141 07/14/2024   K 4.4 07/14/2024   CO2 27 07/14/2024   GLUCOSE 116 (H) 07/14/2024   BUN 20 07/14/2024   CREATININE 1.41 (H) 07/14/2024   CALCIUM  9.3 07/14/2024   GFRNONAA 59 (L) 07/14/2024   GFRAA >60 09/05/2018   Lipid Panel: No results found for: LDLCALC HgbA1c:  Lab Results  Component Value Date    HGBA1C 5.8 (H) 07/13/2024   Urine Drug Screen:     Component Value Date/Time   LABOPIA POSITIVE (A) 08/17/2018 1635   COCAINSCRNUR NONE DETECTED 08/17/2018 1635   LABBENZ TEST NOT PERFORMED, REAGENT NOT AVAILABLE (A) 08/17/2018 1635   AMPHETMU NONE DETECTED 08/17/2018 1635   THCU POSITIVE (A) 08/17/2018 1635   LABBARB NONE DETECTED 08/17/2018 1635  Alcohol Level No results found for: [facility] INR  Lab Results  Component Value Date   INR 1.19 09/01/2018   APTT  Lab Results  Component Value Date   APTT 39 (H) 09/01/2018    MRI Brain(Personally reviewed): Several small cortical strokes in the left MCA distribution    ASSESSMENT   Angas L [name] is a 53 y.o. 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  I would favor embolization as opposed to hypoperfusion both because the neurological symptoms happened before his episode of profound hypotension as well as the appearance on MRI.  He will need workup for embolic strokes.  RECOMMENDATIONS  - HgbA1c, fasting lipid panel - MRI of the brain without contrast - Frequent neuro checks - Echocardiogram - CTA head and neck - Prophylactic therapy-Antiplatelet med: Aspirin  - dose 81mg  daily, going to hol don plavix  for now given recent drop in hct - Risk factor modification - Telemetry monitoring - PT consult, OT consult, Speech consult - Stroke team to follow  ______________________________________________________________________    Signed, Aisha Seals, MD [facility]

## 2024-07-15 NOTE — Progress Notes (Signed)
 STROKE TEAM PROGRESS NOTE   SUBJECTIVE (INTERVAL HISTORY) No family is at the bedside.  Overall his condition is completely resolved.  Patient stated that he is at baseline now.  MRI showed left MCA scattered infarcts.   OBJECTIVE Temp:  [97.7 F (36.5 C)-98.7 F (37.1 C)] 98.3 F (36.8 C) (08/02 1613) Pulse Rate:  [64-76] 76 (08/02 1613) Cardiac Rhythm: Sinus bradycardia (08/02 0700) Resp:  [18-19] 19 (08/02 1613) BP: (131-142)/(78-97) 136/95 (08/02 1613) SpO2:  [98 %-100 %] 99 % (08/02 1613) Weight:  [116.2 kg] 116.2 kg (08/02 0500)  Recent Labs  Lab 07/12/24 0919  GLUCAP 132*   Recent Labs  Lab 07/12/24 0925 07/13/24 0520 07/14/24 0609 07/15/24 0603  NA 139 139 141 142  K 4.4 3.9 4.4 4.1  CL 103 105 105 103  CO2 25 27 27 27   GLUCOSE 115* 124* 116* 105*  BUN 35* 25* 20 19  CREATININE 2.42* 1.40* 1.41* 1.27*  CALCIUM  9.1 8.6* 9.3 9.8   Recent Labs  Lab 07/12/24 0925  AST 24  ALT 18  ALKPHOS 44  BILITOT 1.2  PROT 6.6  ALBUMIN 3.4*   Recent Labs  Lab 07/12/24 0925 07/13/24 0520 07/14/24 0609 07/15/24 0603  WBC 12.1* 9.7 5.8 5.1  NEUTROABS 11.0*  --  3.5  --   HGB 13.4 9.6* 10.3* 10.8*  HCT 40.2 29.1* 30.6* 32.8*  MCV 99.8 99.3 99.0 98.2  PLT 281 259 266 319   No results for input(s): CKTOTAL, CKMB, CKMBINDEX, TROPONINI in the last 168 hours. No results for input(s): LABPROT, INR in the last 72 hours. No results for input(s): COLORURINE, LABSPEC, PHURINE, GLUCOSEU, HGBUR, BILIRUBINUR, KETONESUR, PROTEINUR, UROBILINOGEN, NITRITE, LEUKOCYTESUR in the last 72 hours.  Invalid input(s): APPERANCEUR     Component Value Date/Time   CHOL 196 07/15/2024 0603   TRIG 129 07/15/2024 0603   HDL 42 07/15/2024 0603   CHOLHDL 4.7 07/15/2024 0603   VLDL 26 07/15/2024 0603   LDLCALC 128 (H) 07/15/2024 0603   Lab Results  Component Value Date   HGBA1C 5.8 (H) 07/13/2024      Component Value Date/Time   LABOPIA NONE  DETECTED 07/15/2024 0841   COCAINSCRNUR NONE DETECTED 07/15/2024 0841   COCAINSCRNUR NONE DETECTED 08/17/2018 1635   LABBENZ NONE DETECTED 07/15/2024 0841   AMPHETMU NONE DETECTED 07/15/2024 0841   THCU POSITIVE (A) 07/15/2024 0841   LABBARB NONE DETECTED 07/15/2024 0841    No results for input(s): ETH in the last 168 hours.  I have personally reviewed the radiological images below and agree with the radiology interpretations.  CT ANGIO HEAD NECK W WO CM Result Date: 07/15/2024 EXAM: CTA HEAD AND NECK WITH AND WITHOUT 07/15/2024 03:06:50 PM TECHNIQUE: CTA of the head and neck was performed with and without the administration of intravenous contrast. Multiplanar 2D and/or 3D reformatted images are provided for review. Automated exposure control, iterative reconstruction, and/or weight based adjustment of the mA/kV was utilized to reduce the radiation dose to as low as reasonably achievable. Stenosis of the internal carotid arteries measured using NASCET criteria. COMPARISON: MRI of the head dated 09/13/2024, MRA of the head dated 07/14/2024, and CT of the head dated 07/12/2024. CLINICAL HISTORY: Stroke/TIA, determine embolic source. Chief complaints; Hypotension. FINDINGS: CTA NECK: AORTIC ARCH AND ARCH VESSELS: No dissection or arterial injury. No significant stenosis of the brachiocephalic or subclavian arteries. CERVICAL CAROTID ARTERIES: No dissection, arterial injury, or hemodynamically significant stenosis by NASCET criteria. CERVICAL VERTEBRAL ARTERIES: No dissection, arterial injury, or  significant stenosis. LUNGS AND MEDIASTINUM: Unremarkable. 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 BONES: No acute abnormality. CTA HEAD: ANTERIOR CIRCULATION: No significant stenosis of the internal carotid arteries. Endovascular coil is again demonstrated adjacent to the supraclinoid segment of the left internal carotid artery. No residual or recurrent cerebral aneurysm  present. POSTERIOR CIRCULATION: The left posterior communicating artery is patent. There is mild stenosis of the left P1 and P2 segments but they are patent. There is a complete Circle of Willis. OTHER: No dural venous sinus thrombosis on this non-dedicated study. IMPRESSION: 1. No large vessel occlusion, hemodynamically significant stenosis, or aneurysm in the head or neck. 2. Mild stenosis of the left P1 and P2 segments. Electronically signed by: evalene coho 07/15/2024 04:42 PM EDT RP Workstation: [id number]   ECHOCARDIOGRAM COMPLETE Result Date: 07/15/2024    ECHOCARDIOGRAM REPORT   Patient Name:   Roy [name] Date of Exam: 07/15/2024 Medical Rec #:  [id number]         Height:       75.0 in Accession #:    [id number]        Weight:       256.2 lb Date of Birth:  1971/06/17        BSA:          2.438 m Patient Age:    52 years          BP:           142/90 mmHg Patient Gender: M                 HR:           53 bpm. Exam Location:  Inpatient Procedure: 2D Echo, Cardiac Doppler, Color Doppler and Saline Contrast Bubble            Study (Both Spectral and Color Flow Doppler were utilized during            procedure). Indications:    CVA  History:        Patient has prior history of Echocardiogram examinations, most                 recent 08/18/2018. Stroke; Risk Factors:Hypertension and Current                 Smoker.  Sonographer:    Therisa Crouch Referring Phys: [id number] GRACE LAU IMPRESSIONS  1. Left ventricular ejection fraction, by estimation, is 60 to 65%. The left ventricle has normal function. The left ventricle has no regional wall motion abnormalities. Left ventricular diastolic parameters were normal.  2. Right ventricular systolic function is normal. The right ventricular size is normal.  3. Left atrial size was mildly dilated.  4. The mitral valve is abnormal. Trivial mitral valve regurgitation. No evidence of mitral stenosis.  5. The aortic valve is tricuspid. There is mild calcification of the  aortic valve. There is mild thickening of the aortic valve. Aortic valve regurgitation is not visualized. Aortic valve sclerosis is present, with no evidence of aortic valve stenosis.  6. The inferior vena cava is normal in size with greater than 50% respiratory variability, suggesting right atrial pressure of 3 mmHg.  7. Agitated saline contrast bubble study was negative, with no evidence of any interatrial shunt. FINDINGS  Left Ventricle: Left ventricular ejection fraction, by estimation, is 60 to 65%. The left ventricle has normal function. The left ventricle has no regional wall motion abnormalities. Strain was performed and the  global longitudinal strain is indeterminate. The left ventricular internal cavity size was normal in size. There is no left ventricular hypertrophy. Left ventricular diastolic parameters were normal. Right Ventricle: The right ventricular size is normal. No increase in right ventricular wall thickness. Right ventricular systolic function is normal. Left Atrium: Left atrial size was mildly dilated. Right Atrium: Right atrial size was normal in size. Pericardium: There is no evidence of pericardial effusion. Mitral Valve: The mitral valve is abnormal. There is mild thickening of the mitral valve leaflet(s). Trivial mitral valve regurgitation. No evidence of mitral valve stenosis. Tricuspid Valve: The tricuspid valve is normal in structure. Tricuspid valve regurgitation is trivial. No evidence of tricuspid stenosis. Aortic Valve: The aortic valve is tricuspid. There is mild calcification of the aortic valve. There is mild thickening of the aortic valve. Aortic valve regurgitation is not visualized. Aortic valve sclerosis is present, with no evidence of aortic valve stenosis. Aortic valve mean gradient measures 3.0 mmHg. Aortic valve peak gradient measures 6.2 mmHg. Aortic valve area, by VTI measures 3.92 cm. Pulmonic Valve: The pulmonic valve was normal in structure. Pulmonic valve  regurgitation is not visualized. No evidence of pulmonic stenosis. Aorta: The aortic root is normal in size and structure. Venous: The inferior vena cava is normal in size with greater than 50% respiratory variability, suggesting right atrial pressure of 3 mmHg. IAS/Shunts: No atrial level shunt detected by color flow Doppler. Agitated saline contrast was given intravenously to evaluate for intracardiac shunting. Agitated saline contrast bubble study was negative, with no evidence of any interatrial shunt. Additional Comments: 3D was performed not requiring image post processing on an independent workstation and was indeterminate.  LEFT VENTRICLE PLAX 2D LVIDd:         5.40 cm   Diastology LVIDs:         3.20 cm   LV e' medial:    8.70 cm/s LV PW:         1.30 cm   LV E/e' medial:  9.6 LV IVS:        1.00 cm   LV e' lateral:   11.00 cm/s LVOT diam:     2.20 cm   LV E/e' lateral: 7.6 LV SV:         101 LV SV Index:   42 LVOT Area:     3.80 cm  RIGHT VENTRICLE             IVC RV Basal diam:  3.40 cm     IVC diam: 1.70 cm RV S prime:     17.80 cm/s TAPSE (M-mode): 3.0 cm LEFT ATRIUM             Index LA diam:        4.00 cm 1.64 cm/m LA Vol (A2C):   54.1 ml 22.19 ml/m LA Vol (A4C):   50.3 ml 20.63 ml/m LA Biplane Vol: 54.7 ml 22.44 ml/m  AORTIC VALVE AV Area (Vmax):    3.77 cm AV Area (Vmean):   3.60 cm AV Area (VTI):     3.92 cm AV Vmax:           125.00 cm/s AV Vmean:          81.400 cm/s AV VTI:            0.259 m AV Peak Grad:      6.2 mmHg AV Mean Grad:      3.0 mmHg LVOT Vmax:         124.00  cm/s LVOT Vmean:        77.000 cm/s LVOT VTI:          0.267 m LVOT/AV VTI ratio: 1.03  AORTA Ao Root diam: 3.60 cm Ao Asc diam:  3.30 cm MITRAL VALVE MV Area (PHT): 4.52 cm    SHUNTS MV Decel Time: 168 msec    Systemic VTI:  0.27 m MV E velocity: 83.30 cm/s  Systemic Diam: 2.20 cm MV A velocity: 62.40 cm/s MV E/A ratio:  1.33 Maude Emmer MD Electronically signed by Maude Emmer MD Signature Date/Time:  07/15/2024/11:44:48 AM    Final    MR BRAIN WO CONTRAST Result Date: 07/14/2024 EXAM: MRI BRAIN WITHOUT CONTRAST 07/14/2024 01:59:29 PM TECHNIQUE: Multiplanar multisequence MRI of the head/brain was performed without the administration of intravenous contrast. COMPARISON: CT of the head dated 07/12/2024. CLINICAL HISTORY: Hx of left PCA with coil. R/O aneurysm or new coil movement. FINDINGS: BRAIN AND VENTRICLES: Scattered foci of restricted diffusion are present within the left cerebral hemisphere, including the precentral and postcentral gyri and the occipital lobe. Numerous scattered foci of increased T2 signal are present within the subcortical and deep cerebral white matter. No intracranial hemorrhage. No mass. No midline shift. No hydrocephalus. The sella is unremarkable. Normal flow voids. ORBITS: Disconjugate gaze. SINUSES AND MASTOIDS: No acute abnormality. BONES AND SOFT TISSUES: A cystic lesion is noted within the right parotid measuring approximately 2 cm in diameter, as seen on the previous study. IMPRESSION: 1. Scattered foci of restricted diffusion within the left cerebral hemisphere, including the precentral and postcentral gyri and the occipital lobe, which are compatible with acute Embolic Nonhemorrhagic infarcts. 2. Numerous scattered foci of increased T2 signal within the subcortical and deep cerebral white matter. 3. Disconjugate gaze. 4. 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 Cystic neoplasm remains a diagnosis of exclusion. Electronically signed by: evalene coho 07/14/2024 05:36 PM EDT RP Workstation: [id number]   MR ANGIO HEAD WO CONTRAST Result Date: 07/14/2024 EXAM: MR Angiography Head without intravenous Contrast. 07/14/2024 01:59:29 PM TECHNIQUE: Magnetic resonance angiography images of the head without intravenous contrast. Multiplanar 2D and 3D reformatted images are provided for review. COMPARISON:  None provided. CLINICAL HISTORY: History of left PCA with coil. Rule out aneurysm or new coil movement. FINDINGS: ANTERIOR CIRCULATION: There is mild stenosis of the anterior genua of the cavernous internal carotid arteries bilaterally. There is no flow-limiting stenosis. No significant stenosis of the anterior cerebral arteries. No significant stenosis of the middle cerebral arteries. No aneurysm. POSTERIOR CIRCULATION: No significant stenosis of the posterior cerebral arteries. No significant stenosis of the basilar artery. No significant stenosis of the vertebral arteries. No aneurysm. IMPRESSION: 1. No evidence of aneurysm or new coil movement in the left PCA. 2. Mild stenosis of the anterior Genua of the cavernous internal carotid arteries bilaterally, without flow-limiting stenosis. Electronically signed by: evalene coho 07/14/2024 03:01 PM EDT RP Workstation: [id number]   CT Renal Stone Study Result Date: 07/12/2024 CLINICAL DATA:  Abdominal/flank pain, stone suspected EXAM: CT ABDOMEN AND PELVIS WITHOUT CONTRAST TECHNIQUE: Multidetector CT imaging of the abdomen and pelvis was performed following the standard protocol without IV contrast. RADIATION DOSE REDUCTION: This exam was performed according to the departmental dose-optimization program which includes automated exposure control, adjustment of the mA and/or kV according to patient size and/or use of iterative reconstruction technique. COMPARISON:  None Available. FINDINGS: Lower chest: No acute abnormality. Hepatobiliary: Scattered subcentimeter focal hypodensities in  the right and left hepatic lobes are too small to definitively characterize and may represent small cysts. Gallbladder is unremarkable. No biliary dilatation. Pancreas: Unremarkable. No pancreatic ductal dilatation or surrounding inflammatory changes. Spleen: Normal in size without focal abnormality. Adrenals/Urinary Tract: Adrenal glands are unremarkable. Bilateral renal cysts with  the largest measuring up to 2.1 cm arising from the superior pole of the right kidney. Nonobstructive 10 mm calculus in the inferior pole of the right kidney. Nonobstructive 5 mm calculus in the inferior pole of the left kidney. No ureteral calculi. No hydronephrosis. Bladder is unremarkable. Stomach/Bowel: Stomach is within normal limits. Appendix appears normal. No evidence of bowel wall thickening, distention, or inflammatory changes. Vascular/Lymphatic: Aortic atherosclerosis. Nonaneurysmal abdominal aorta. No enlarged abdominal or pelvic lymph nodes. Reproductive: Prostate is unremarkable. Other: Small fat containing paraumbilical hernia. No abdominopelvic ascites. No intraperitoneal free air. Musculoskeletal: No acute osseous abnormality. No suspicious osseous lesion. Degenerative disc height loss and endplate osteophytosis at L4-L5 and L5-S1. IMPRESSION: 1. Nonobstructive bilateral renal calculi measuring up to 10 mm on the right. No ureteral calculi. No hydronephrosis. 2.  Aortic Atherosclerosis (ICD10-I70.0). Electronically Signed   By: Harrietta Sherry M.D.   On: 07/12/2024 12:36   CT Head Wo Contrast Result Date: 07/12/2024 CLINICAL DATA:  53 year old male with headache and right side numbness. Dizziness, syncope. Hypotensive. EXAM: CT HEAD WITHOUT CONTRAST TECHNIQUE: Contiguous axial images were obtained from the base of the skull through the vertex without intravenous contrast. RADIATION DOSE REDUCTION: This exam was performed according to the departmental dose-optimization program which includes automated exposure control, adjustment of the mA and/or kV according to patient size and/or use of iterative reconstruction technique. COMPARISON:  None Available. FINDINGS: Brain: Cerebral volume is normal for age. No midline shift, ventriculomegaly, mass effect, evidence of mass lesion, intracranial hemorrhage or evidence of cortically based acute infarction. Gray-white matter differentiation is within  normal limits throughout the brain. Vascular: A distal left ICA siphon vascular stent is visible. No suspicious intracranial vascular hyperdensity. Skull: Intact.  No acute osseous abnormality identified. Sinuses/Orbits: Visualized paranasal sinuses and mastoids are well aerated. Tympanic cavities appear clear. Other: Mildly Disconjugate gaze. Negative visible scalp soft tissues. Partially visible round 2.5 cm cystic appearing mass in the right parotid space, series 3, image 1. no regional inflammation. IMPRESSION: 1. Distal left ICA siphon vascular stent is visible. Normal for age noncontrast CT appearance of the Brain. 2. Partially visible 2.5 cm cystic appearing mass within the right parotid space. 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 Electronically Signed   By: VEAR Hurst M.D.   On: 07/12/2024 10:19   DG Chest Port 1 View Result Date: 07/12/2024 CLINICAL DATA:  Syncope, hypotension EXAM: PORTABLE CHEST 1 VIEW COMPARISON:  10/25/2018 and CT chest 03/18/2021 FINDINGS: Mid and lower thoracic spondylosis. Heart size within normal limits. Upper zone pulmonary vascular prominence may be due to supine positioning rather than pulmonary venous hypertension. Left costophrenic angle excluded. The included lungs appear clear. IMPRESSION: 1. Upper zone pulmonary vascular prominence may be due to supine positioning rather than pulmonary venous hypertension. 2. Mid and lower thoracic spondylosis. Electronically Signed   By: Ryan Salvage M.D.   On: 07/12/2024 09:48     PHYSICAL EXAM  Temp:  [97.7 F (36.5 C)-98.7 F (37.1 C)] 98.3 F (36.8 C) (08/02 1613) Pulse Rate:  [64-76] 76 (08/02 1613) Resp:  [18-19] 19 (08/02 1613) BP: (131-142)/(78-97) 136/95 (08/02 1613) SpO2:  [98 %-100 %] 99 % (08/02 1613) Weight:  [883.7 kg] 116.2 kg (  08/02 0500)  General - Well nourished, well developed, in no apparent distress.  Ophthalmologic - fundi not visualized due to noncooperation.  Cardiovascular - Regular rhythm  and rate.  Mental Status -  Level of arousal and orientation to time, place, and person were intact. Language including expression, naming, repetition, comprehension was assessed and found intact. Attention span and concentration were normal. Fund of Knowledge was assessed and was intact.  Cranial Nerves II - XII - II - Visual field intact OU. III, IV, VI - Extraocular movements intact. V - Facial sensation intact bilaterally. VII - Facial movement intact bilaterally. VIII - Hearing & vestibular intact bilaterally. X - Palate elevates symmetrically. XI - Chin turning & shoulder shrug intact bilaterally. XII - Tongue protrusion intact.  Motor Strength - The patient's strength was normal in all extremities and pronator drift was absent.  Bulk was normal and fasciculations were absent.   Motor Tone - Muscle tone was assessed at the neck and appendages and was normal.  Reflexes - The patient's reflexes were symmetrical in all extremities and he had no pathological reflexes.  Sensory - Light touch, temperature/pinprick were assessed and were symmetrical.    Coordination - The patient had normal movements in the hands and feet with no ataxia or dysmetria.  Tremor was absent.  Gait and Station - deferred.   ASSESSMENT/PLAN [name] is a 53 y.o. male with history of hypertension, hyperlipidemia, CAD, smoker, left P-comm aneurysm status post coil 2021 admitted for chills, malaise, urinary frequency, right arm weakness and numbness.  EEG had syncope or episode with bradycardia and BP as low as 55/43, received IV fluid.  Found to have AKI, UTI and sepsis.  No TNK given due to outside window.    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  Will start Plavix  once hemoglobin stabilized Patient counseled to be compliant with his antithrombotic medications Ongoing aggressive stroke risk factor management Therapy recommendations: None Disposition: Pending  History of hypertension Hypotension with syncope BP now stable Avoid low BP Long term BP goal normotensive  Hyperlipidemia Home meds: Lipitor 40 LDL 128, goal < 70 Now on Lipitor 80 Continue statin at discharge  UTI Kidney stone AKI UA WBC more than 50 Urine culture Citrobacter freundii Creatinine 1.40-1.27 CT renal stone study showed nonobstructive bilateral renal calculi measuring up to 10 mm on the right On Levaquin  Management per primary team  Tobacco abuse Current smoker Smoking cessation counseling provided Pt is willing to quit  Anemia Hemoglobin 13.4--9.6--10.3--10.8 CBC monitoring Will start Plavix  once hemoglobin stabilized  Other Stroke Risk Factors THC abuse, UDS positive for THC, cessation education provided Obesity, Body mass index is 32.02 kg/m.  Coronary artery disease  Other Active Problems Left P-comm aneurysm status post coil in 2021 at [facility] day # 2  I spent additional 30 minutes in patient face-to-face time with the patient, reviewing test results, images and medication, and discussing the diagnosis, treatment plan and potential prognosis. This patient's care requiresreview of multiple databases, neurological assessment, discussion with family, other specialists and medical decision making of high complexity.  Ary Cummins, MD PhD Stroke Neurology 07/15/2024 7:00 PM    To contact Stroke Continuity provider, please refer to [email or web address]. After hours, contact General Neurology

## 2024-07-15 NOTE — Plan of Care (Signed)

## 2024-07-16 ENCOUNTER — Inpatient Hospital Stay (HOSPITAL_COMMUNITY)

## 2024-07-16 DIAGNOSIS — F17210 Nicotine dependence, cigarettes, uncomplicated: Secondary | ICD-10-CM

## 2024-07-16 DIAGNOSIS — F1210 Cannabis abuse, uncomplicated: Secondary | ICD-10-CM

## 2024-07-16 DIAGNOSIS — E785 Hyperlipidemia, unspecified: Secondary | ICD-10-CM

## 2024-07-16 DIAGNOSIS — I63412 Cerebral infarction due to embolism of left middle cerebral artery: Secondary | ICD-10-CM

## 2024-07-16 DIAGNOSIS — I639 Cerebral infarction, unspecified: Secondary | ICD-10-CM | POA: Diagnosis not present

## 2024-07-16 LAB — CBC
HCT: 34.3 % — ABNORMAL LOW (ref 39.0–52.0)
Hemoglobin: 11.4 g/dL — ABNORMAL LOW (ref 13.0–17.0)
MCH: 32.7 pg (ref 26.0–34.0)
MCHC: 33.2 g/dL (ref 30.0–36.0)
MCV: 98.3 fL (ref 80.0–100.0)
Platelets: 322 K/uL (ref 150–400)
RBC: 3.49 MIL/uL — ABNORMAL LOW (ref 4.22–5.81)
RDW: 13.2 % (ref 11.5–15.5)
WBC: 5.8 K/uL (ref 4.0–10.5)
nRBC: 0 % (ref 0.0–0.2)

## 2024-07-16 LAB — BASIC METABOLIC PANEL WITH GFR
Anion gap: 10 (ref 5–15)
BUN: 23 mg/dL — ABNORMAL HIGH (ref 6–20)
CO2: 28 mmol/L (ref 22–32)
Calcium: 9.8 mg/dL (ref 8.9–10.3)
Chloride: 102 mmol/L (ref 98–111)
Creatinine, Ser: 1.37 mg/dL — ABNORMAL HIGH (ref 0.61–1.24)
GFR, Estimated: >60 mL/min (ref >60)
Glucose, Bld: 114 mg/dL — ABNORMAL HIGH (ref 70–99)
Potassium: 4.3 mmol/L (ref 3.5–5.1)
Sodium: 140 mmol/L (ref 135–145)

## 2024-07-16 MED ORDER — CLOPIDOGREL BISULFATE 75 MG PO TABS
75.0000 mg | ORAL_TABLET | Freq: Every day | ORAL | Status: DC
  Administered 2024-07-16 – 2024-07-17 (×2): 75 mg via ORAL
  Filled 2024-07-16 (×2): qty 1

## 2024-07-16 NOTE — Progress Notes (Addendum)
 HD#3 SUBJECTIVE:  Patient Summary: Roy [name] is a 53 y.o. with a pertinent PMH of HTN, HLD, bradycardia, prediabetes, nonobstructive coronary artery disease, cerebral aneurysm post coil, hematuria, and right-sided pleural effusion s/p thoracentesis who presented with hypotension and weakness and admitted for sepsis.   Overnight Events: none  Interim History: Patient continues to do well without any new or worsening complaints.  No weakness or other focal deficits. Feeling well overall. He notes wanting to understand his condition further and asked good, pertinent questions regarding his stroke workup. Patient understands and agreeable to plan when discussing next steps.  OBJECTIVE:  Vital Signs: Vitals:   07/15/24 2117 07/16/24 0536 07/16/24 0751 07/16/24 0915  BP: 134/84 116/73  124/81  Pulse: 72 (!) 56  62  Resp: 19   19  Temp: 98.2 F (36.8 C) 98.8 F (37.1 C)  98.4 F (36.9 C)  TempSrc: Oral Oral    SpO2: 97% 97% 98% 98%  Weight:      Height:       Supplemental O2: Room Air SpO2: 98 %  Filed Weights   07/12/24 0921 07/15/24 0500  Weight: 111.1 kg 116.2 kg     Intake/Output Summary (Last 24 hours) at 07/16/2024 0919 Last data filed at 07/16/2024 0132 Gross per 24 hour  Intake 800 ml  Output 1300 ml  Net -500 ml   Net IO Since Admission: 367.68 mL [07/16/24 0919]  Physical Exam: Constitutional:well appearing middle aged male laying in bed. In no acute distress. Pulm: Normal work of breathing on room air. FDX:Wzhjupcz for extremity edema. Skin:Warm and dry. Neuro:Alert and oriented x3.   Patient Lines/Drains/Airways Status     Active Line/Drains/Airways     Name Placement date Placement time Site Days   Peripheral IV 07/12/24 22 G Distal;Left;Posterior Wrist 07/12/24  1703  Wrist  1   Incision (Closed) 09/02/18 Chest Right 09/02/18  1320  -- 2141            Pertinent labs and imaging:      Latest Ref Rng & Units 07/16/2024    5:07 AM 07/15/2024     6:03 AM 07/14/2024    6:09 AM  CBC  WBC 4.0 - 10.5 K/uL 5.8  5.1  5.8   Hemoglobin 13.0 - 17.0 g/dL 88.5  89.1  89.6   Hematocrit 39.0 - 52.0 % 34.3  32.8  30.6   Platelets 150 - 400 K/uL 322  319  266        Latest Ref Rng & Units 07/16/2024    5:07 AM 07/15/2024    6:03 AM 07/14/2024    6:09 AM  CMP  Glucose 70 - 99 mg/dL 885  894  883   BUN 6 - 20 mg/dL 23  19  20    Creatinine 0.61 - 1.24 mg/dL 8.62  8.72  8.58   Sodium 135 - 145 mmol/L 140  142  141   Potassium 3.5 - 5.1 mmol/L 4.3  4.1  4.4   Chloride 98 - 111 mmol/L 102  103  105   CO2 22 - 32 mmol/L 28  27  27    Calcium  8.9 - 10.3 mg/dL 9.8  9.8  9.3     CT ANGIO HEAD NECK W WO CM Result Date: 07/15/2024 EXAM: CTA HEAD AND NECK WITH AND WITHOUT 07/15/2024 03:06:50 PM TECHNIQUE: CTA of the head and neck was performed with and without the administration of intravenous contrast. Multiplanar 2D and/or 3D reformatted images are provided  for review. Automated exposure control, iterative reconstruction, and/or weight based adjustment of the mA/kV was utilized to reduce the radiation dose to as low as reasonably achievable. Stenosis of the internal carotid arteries measured using NASCET criteria. COMPARISON: MRI of the head dated 09/13/2024, MRA of the head dated 07/14/2024, and CT of the head dated 07/12/2024. CLINICAL HISTORY: Stroke/TIA, determine embolic source. Chief complaints; Hypotension. FINDINGS: CTA NECK: AORTIC ARCH AND ARCH VESSELS: No dissection or arterial injury. No significant stenosis of the brachiocephalic or subclavian arteries. CERVICAL CAROTID ARTERIES: No dissection, arterial injury, or hemodynamically significant stenosis by NASCET criteria. CERVICAL VERTEBRAL ARTERIES: No dissection, arterial injury, or significant stenosis. LUNGS AND MEDIASTINUM: Unremarkable. 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 BONES: No acute abnormality. CTA HEAD: ANTERIOR CIRCULATION: No significant stenosis of  the internal carotid arteries. Endovascular coil is again demonstrated adjacent to the supraclinoid segment of the left internal carotid artery. No residual or recurrent cerebral aneurysm present. POSTERIOR CIRCULATION: The left posterior communicating artery is patent. There is mild stenosis of the left P1 and P2 segments but they are patent. There is a complete Circle of Willis. OTHER: No dural venous sinus thrombosis on this non-dedicated study. IMPRESSION: 1. No large vessel occlusion, hemodynamically significant stenosis, or aneurysm in the head or neck. 2. Mild stenosis of the left P1 and P2 segments. Electronically signed by: evalene coho 07/15/2024 04:42 PM EDT RP Workstation: [id number]   ECHOCARDIOGRAM COMPLETE Result Date: 07/15/2024    ECHOCARDIOGRAM REPORT   Patient Name:   Roy [name] Date of Exam: 07/15/2024 Medical Rec #:  [id number]         Height:       75.0 in Accession #:    [id number]        Weight:       256.2 lb Date of Birth:  [birth year]        BSA:          2.438 m Patient Age:    52 years          BP:           142/90 mmHg Patient Gender: M                 HR:           53 bpm. Exam Location:  Inpatient Procedure: 2D Echo, Cardiac Doppler, Color Doppler and Saline Contrast Bubble            Study (Both Spectral and Color Flow Doppler were utilized during            procedure). Indications:    CVA  History:        Patient has prior history of Echocardiogram examinations, most                 recent 08/18/2018. Stroke; Risk Factors:Hypertension and Current                 Smoker.  Sonographer:    Therisa Crouch Referring Phys: [id number] GRACE LAU IMPRESSIONS  1. Left ventricular ejection fraction, by estimation, is 60 to 65%. The left ventricle has normal function. The left ventricle has no regional wall motion abnormalities. Left ventricular diastolic parameters were normal.  2. Right ventricular systolic function is normal. The right ventricular size is normal.  3. Left atrial size was  mildly dilated.  4. The mitral valve is abnormal. Trivial mitral valve regurgitation. No evidence of mitral stenosis.  5. The aortic  valve is tricuspid. There is mild calcification of the aortic valve. There is mild thickening of the aortic valve. Aortic valve regurgitation is not visualized. Aortic valve sclerosis is present, with no evidence of aortic valve stenosis.  6. The inferior vena cava is normal in size with greater than 50% respiratory variability, suggesting right atrial pressure of 3 mmHg.  7. Agitated saline contrast bubble study was negative, with no evidence of any interatrial shunt. FINDINGS  Left Ventricle: Left ventricular ejection fraction, by estimation, is 60 to 65%. The left ventricle has normal function. The left ventricle has no regional wall motion abnormalities. Strain was performed and the global longitudinal strain is indeterminate. The left ventricular internal cavity size was normal in size. There is no left ventricular hypertrophy. Left ventricular diastolic parameters were normal. Right Ventricle: The right ventricular size is normal. No increase in right ventricular wall thickness. Right ventricular systolic function is normal. Left Atrium: Left atrial size was mildly dilated. Right Atrium: Right atrial size was normal in size. Pericardium: There is no evidence of pericardial effusion. Mitral Valve: The mitral valve is abnormal. There is mild thickening of the mitral valve leaflet(s). Trivial mitral valve regurgitation. No evidence of mitral valve stenosis. Tricuspid Valve: The tricuspid valve is normal in structure. Tricuspid valve regurgitation is trivial. No evidence of tricuspid stenosis. Aortic Valve: The aortic valve is tricuspid. There is mild calcification of the aortic valve. There is mild thickening of the aortic valve. Aortic valve regurgitation is not visualized. Aortic valve sclerosis is present, with no evidence of aortic valve stenosis. Aortic valve mean gradient  measures 3.0 mmHg. Aortic valve peak gradient measures 6.2 mmHg. Aortic valve area, by VTI measures 3.92 cm. Pulmonic Valve: The pulmonic valve was normal in structure. Pulmonic valve regurgitation is not visualized. No evidence of pulmonic stenosis. Aorta: The aortic root is normal in size and structure. Venous: The inferior vena cava is normal in size with greater than 50% respiratory variability, suggesting right atrial pressure of 3 mmHg. IAS/Shunts: No atrial level shunt detected by color flow Doppler. Agitated saline contrast was given intravenously to evaluate for intracardiac shunting. Agitated saline contrast bubble study was negative, with no evidence of any interatrial shunt. Additional Comments: 3D was performed not requiring image post processing on an independent workstation and was indeterminate.  LEFT VENTRICLE PLAX 2D LVIDd:         5.40 cm   Diastology LVIDs:         3.20 cm   LV e' medial:    8.70 cm/s LV PW:         1.30 cm   LV E/e' medial:  9.6 LV IVS:        1.00 cm   LV e' lateral:   11.00 cm/s LVOT diam:     2.20 cm   LV E/e' lateral: 7.6 LV SV:         101 LV SV Index:   42 LVOT Area:     3.80 cm  RIGHT VENTRICLE             IVC RV Basal diam:  3.40 cm     IVC diam: 1.70 cm RV S prime:     17.80 cm/s TAPSE (M-mode): 3.0 cm LEFT ATRIUM             Index LA diam:        4.00 cm 1.64 cm/m LA Vol (A2C):   54.1 ml 22.19 ml/m LA Vol (A4C):   50.3  ml 20.63 ml/m LA Biplane Vol: 54.7 ml 22.44 ml/m  AORTIC VALVE AV Area (Vmax):    3.77 cm AV Area (Vmean):   3.60 cm AV Area (VTI):     3.92 cm AV Vmax:           125.00 cm/s AV Vmean:          81.400 cm/s AV VTI:            0.259 m AV Peak Grad:      6.2 mmHg AV Mean Grad:      3.0 mmHg LVOT Vmax:         124.00 cm/s LVOT Vmean:        77.000 cm/s LVOT VTI:          0.267 m LVOT/AV VTI ratio: 1.03  AORTA Ao Root diam: 3.60 cm Ao Asc diam:  3.30 cm MITRAL VALVE MV Area (PHT): 4.52 cm    SHUNTS MV Decel Time: 168 msec    Systemic VTI:  0.27 m MV  E velocity: 83.30 cm/s  Systemic Diam: 2.20 cm MV A velocity: 62.40 cm/s MV E/A ratio:  1.33 Maude Emmer MD Electronically signed by Maude Emmer MD Signature Date/Time: 07/15/2024/11:44:48 AM    Final     ASSESSMENT/PLAN:  Assessment: Principal Problem:   Sepsis [facility]) Active Problems:   Essential hypertension   History of cerebral aneurysm repair   Nephrolithiasis   UTI (urinary tract infection)   AKI (acute kidney injury) (HCC)   Cerebrovascular accident (CVA) (HCC)  [name] is a 53 y.o. with a pertinent PMH of HTN, HLD, bradycardia, prediabetes, nonobstructive coronary artery disease, cerebral aneurysm post coil, hematuria, and right-sided pleural effusion s/p thoracentesis who presented with hypotension and weakness and admitted for sepsis due to Citrobacter UTI and found to have acute left cerebellar infarcts.  Plan: Acute left cerebral infarcts, likely embolic Further evaluation shows no abnormal heart rhythms on telemetry and no known history of A-fib.  LDL elevated at 128. CTA Head and Neck showing no large vessel occlusion, hemodynamically significant stenosis, or aneurysm in the head or neck. He does have a mild stenosis of the left P1 and P2 segments. Echocardiogram shows EF 60-65% with no LVH, mild left atrial dilation, with a trivial mitral valve regurgitation but no active mitral or aortic stenosis. Bubble study negative for any evidence of intracranial shunt. TEE and US  LE DVT scheduled for further assessment. Cardiology consulted at neurology's request to schedule TEE tomorrow.  -- Cardiology consult - Pending TEE, loop recorder, lower extremity venous Dopplers - Increase atorvastatin  to 80 mg daily and continue aspirin  81 mg daily -- Add Plavix  75mg  - He may need a 30-day heart monitor on discharge  Citrobacter freundii UTI Bilateral nonobstructing renal stones Stable without return of any urinary symptoms or flank/groin pain. - Levofloxacin  750 mg daily for 7  days (today is day 4/7) - Urology follow-up outpatient  #AKI vs CKD Creatinine 1.37 today.  Expected this rise after contrasted CT. Hold off on adding ARB or thiazide for blood pressure at this time.   #Normocytic anemia Stable with a hemoglobin of 11.4.  No signs of active bleeding.  #Hypertension Hypotension, resolved Remains normotensive to slightly hypertensive off carvedilol , chlorthalidone, and losartan.  As above we will hold off on adding ARB or thiazide with contrasted CT scan.  If blood pressure continues to rise we will add hydralazine  while inpatient but ultimately plan to add back ARB/thiazide preferentially.  #Prior Cerebral Aneurysm  with coil placement MRA to evaluate left PCA aneurysm coil showed no changes.  #Prediabetes Chronic, stable. Last A1c 6.0% in 2021.  A1c here 5.8.  #Hyperlipidemia Increase atorvastatin  as above.  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 Follow up with ENT outpatient but no signs of parotid gland dysfunction or dysphagia.  MRI shows unchanged from CT on admission and will need ENT follow-up.  Best Practice: Diet: Regular diet IVF: None VTE: enoxaparin  (LOVENOX ) injection 40 mg Start: 07/12/24 2200 Code: Full  Disposition planning: Therapy Recs: Home Family Contact: fiance and daughter DISPO: Anticipated discharge in 1-2 days pending stroke workup.  Signature:  Landis Cassaro [facility]  9:19 AM, 07/16/2024  On Call pager [phone]

## 2024-07-16 NOTE — Progress Notes (Signed)
 STROKE TEAM PROGRESS NOTE   SUBJECTIVE (INTERVAL HISTORY) No family is at the bedside.  Patient lying in bed, no acute event overnight and neuro intact. He has been taking phentermine for weight loss for 7-8 months and lost 50lbs. CTA head and neck unremarkable. Recommend TEE and loop recorder.    OBJECTIVE Temp:  [98.2 F (36.8 C)-98.8 F (37.1 C)] 98.4 F (36.9 C) (08/03 0915) Pulse Rate:  [56-76] 62 (08/03 0915) Cardiac Rhythm: Normal sinus rhythm (08/03 0749) Resp:  [19] 19 (08/03 0915) BP: (116-136)/(73-95) 124/81 (08/03 0915) SpO2:  [97 %-99 %] 98 % (08/03 0915)  Recent Labs  Lab 07/12/24 0919  GLUCAP 132*   Recent Labs  Lab 07/12/24 0925 07/13/24 0520 07/14/24 0609 07/15/24 0603 07/16/24 0507  NA 139 139 141 142 140  K 4.4 3.9 4.4 4.1 4.3  CL 103 105 105 103 102  CO2 25 27 27 27 28   GLUCOSE 115* 124* 116* 105* 114*  BUN 35* 25* 20 19 23*  CREATININE 2.42* 1.40* 1.41* 1.27* 1.37*  CALCIUM  9.1 8.6* 9.3 9.8 9.8   Recent Labs  Lab 07/12/24 0925  AST 24  ALT 18  ALKPHOS 44  BILITOT 1.2  PROT 6.6  ALBUMIN 3.4*   Recent Labs  Lab 07/12/24 0925 07/13/24 0520 07/14/24 0609 07/15/24 0603 07/16/24 0507  WBC 12.1* 9.7 5.8 5.1 5.8  NEUTROABS 11.0*  --  3.5  --   --   HGB 13.4 9.6* 10.3* 10.8* 11.4*  HCT 40.2 29.1* 30.6* 32.8* 34.3*  MCV 99.8 99.3 99.0 98.2 98.3  PLT 281 259 266 319 322   No results for input(s): CKTOTAL, CKMB, CKMBINDEX, TROPONINI in the last 168 hours. No results for input(s): LABPROT, INR in the last 72 hours. No results for input(s): COLORURINE, LABSPEC, PHURINE, GLUCOSEU, HGBUR, BILIRUBINUR, KETONESUR, PROTEINUR, UROBILINOGEN, NITRITE, LEUKOCYTESUR in the last 72 hours.  Invalid input(s): APPERANCEUR     Component Value Date/Time   CHOL 196 07/15/2024 0603   TRIG 129 07/15/2024 0603   HDL 42 07/15/2024 0603   CHOLHDL 4.7 07/15/2024 0603   VLDL 26 07/15/2024 0603   LDLCALC 128 (H) 07/15/2024  0603   Lab Results  Component Value Date   HGBA1C 5.8 (H) 07/13/2024      Component Value Date/Time   LABOPIA NONE DETECTED 07/15/2024 0841   COCAINSCRNUR NONE DETECTED 07/15/2024 0841   COCAINSCRNUR NONE DETECTED 08/17/2018 1635   LABBENZ NONE DETECTED 07/15/2024 0841   AMPHETMU NONE DETECTED 07/15/2024 0841   THCU POSITIVE (A) 07/15/2024 0841   LABBARB NONE DETECTED 07/15/2024 0841    No results for input(s): ETH in the last 168 hours.  I have personally reviewed the radiological images below and agree with the radiology interpretations.  VAS US  LOWER EXTREMITY VENOUS (DVT) Result Date: 07/16/2024  Lower Venous DVT Study Patient Name:  Roy [name]  Date of Exam:   07/16/2024 Medical Rec #: [id number]          Accession #:    [id number] Date of Birth: 09/18/1971         Patient Gender: M Patient Age:   53 years Exam Location:  [facility] Procedure:      VAS US  LOWER EXTREMITY VENOUS (DVT) Referring Phys: GRACE LAU --------------------------------------------------------------------------------  Indications: Stroke.  Comparison Study: No priors. Performing Technologist: Ricka Sturdivant-Jones RDMS, RVT  Examination Guidelines: A complete evaluation includes B-mode imaging, spectral Doppler, color Doppler, and power Doppler as needed of all accessible portions of each vessel.  Bilateral testing is considered an integral part of a complete examination. Limited examinations for reoccurring indications may be performed as noted. The reflux portion of the exam is performed with the patient in reverse Trendelenburg.  +---------+---------------+---------+-----------+----------+--------------+ RIGHT    CompressibilityPhasicitySpontaneityPropertiesThrombus Aging +---------+---------------+---------+-----------+----------+--------------+ CFV      Full           Yes      Yes                                 +---------+---------------+---------+-----------+----------+--------------+  SFJ      Full                                                        +---------+---------------+---------+-----------+----------+--------------+ FV Prox  Full                                                        +---------+---------------+---------+-----------+----------+--------------+ FV Mid   Full                                                        +---------+---------------+---------+-----------+----------+--------------+ FV DistalFull                                                        +---------+---------------+---------+-----------+----------+--------------+ PFV      Full                                                        +---------+---------------+---------+-----------+----------+--------------+ POP      Full           Yes      Yes                                 +---------+---------------+---------+-----------+----------+--------------+ PTV      Full                                                        +---------+---------------+---------+-----------+----------+--------------+ PERO     Full                                                        +---------+---------------+---------+-----------+----------+--------------+   +---------+---------------+---------+-----------+----------+--------------+ LEFT     CompressibilityPhasicitySpontaneityPropertiesThrombus Aging +---------+---------------+---------+-----------+----------+--------------+  CFV      Full           Yes      Yes                                 +---------+---------------+---------+-----------+----------+--------------+ SFJ      Full                                                        +---------+---------------+---------+-----------+----------+--------------+ FV Prox  Full                                                        +---------+---------------+---------+-----------+----------+--------------+ FV Mid   Full                                                         +---------+---------------+---------+-----------+----------+--------------+ FV DistalFull                                                        +---------+---------------+---------+-----------+----------+--------------+ PFV      Full                                                        +---------+---------------+---------+-----------+----------+--------------+ POP      Full           Yes      Yes                                 +---------+---------------+---------+-----------+----------+--------------+ PTV      Full                                                        +---------+---------------+---------+-----------+----------+--------------+ PERO     Full                                                        +---------+---------------+---------+-----------+----------+--------------+     Summary: BILATERAL: - No evidence of deep vein thrombosis seen in the lower extremities, bilaterally. -No evidence of popliteal cyst, bilaterally.   *See table(s) above for measurements and observations.    Preliminary    CT ANGIO HEAD NECK W WO CM Result Date: 07/15/2024 EXAM: CTA HEAD AND  NECK WITH AND WITHOUT 07/15/2024 03:06:50 PM TECHNIQUE: CTA of the head and neck was performed with and without the administration of intravenous contrast. Multiplanar 2D and/or 3D reformatted images are provided for review. Automated exposure control, iterative reconstruction, and/or weight based adjustment of the mA/kV was utilized to reduce the radiation dose to as low as reasonably achievable. Stenosis of the internal carotid arteries measured using NASCET criteria. COMPARISON: MRI of the head dated 09/13/2024, MRA of the head dated 07/14/2024, and CT of the head dated 07/12/2024. CLINICAL HISTORY: Stroke/TIA, determine embolic source. Chief complaints; Hypotension. FINDINGS: CTA NECK: AORTIC ARCH AND ARCH VESSELS: No dissection or arterial injury. No significant stenosis  of the brachiocephalic or subclavian arteries. CERVICAL CAROTID ARTERIES: No dissection, arterial injury, or hemodynamically significant stenosis by NASCET criteria. CERVICAL VERTEBRAL ARTERIES: No dissection, arterial injury, or significant stenosis. LUNGS AND MEDIASTINUM: Unremarkable. 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 BONES: No acute abnormality. CTA HEAD: ANTERIOR CIRCULATION: No significant stenosis of the internal carotid arteries. Endovascular coil is again demonstrated adjacent to the supraclinoid segment of the left internal carotid artery. No residual or recurrent cerebral aneurysm present. POSTERIOR CIRCULATION: The left posterior communicating artery is patent. There is mild stenosis of the left P1 and P2 segments but they are patent. There is a complete Circle of Willis. OTHER: No dural venous sinus thrombosis on this non-dedicated study. IMPRESSION: 1. No large vessel occlusion, hemodynamically significant stenosis, or aneurysm in the head or neck. 2. Mild stenosis of the left P1 and P2 segments. Electronically signed by: evalene coho 07/15/2024 04:42 PM EDT RP Workstation: [id number]   ECHOCARDIOGRAM COMPLETE Result Date: 07/15/2024    ECHOCARDIOGRAM REPORT   Patient Name:   Roy [name] Date of Exam: 07/15/2024 Medical Rec #:  [id number]         Height:       75.0 in Accession #:    [id number]        Weight:       256.2 lb Date of Birth:  30-Jan-1971        BSA:          2.438 m Patient Age:    52 years          BP:           142/90 mmHg Patient Gender: M                 HR:           53 bpm. Exam Location:  Inpatient Procedure: 2D Echo, Cardiac Doppler, Color Doppler and Saline Contrast Bubble            Study (Both Spectral and Color Flow Doppler were utilized during            procedure). Indications:    CVA  History:        Patient has prior history of Echocardiogram examinations, most                 recent 08/18/2018. Stroke; Risk  Factors:Hypertension and Current                 Smoker.  Sonographer:    Therisa Crouch Referring Phys: [id number] GRACE LAU IMPRESSIONS  1. Left ventricular ejection fraction, by estimation, is 60 to 65%. The left ventricle has normal function. The left ventricle has no regional wall motion abnormalities. Left ventricular diastolic parameters were normal.  2. Right ventricular systolic function is normal. The right  ventricular size is normal.  3. Left atrial size was mildly dilated.  4. The mitral valve is abnormal. Trivial mitral valve regurgitation. No evidence of mitral stenosis.  5. The aortic valve is tricuspid. There is mild calcification of the aortic valve. There is mild thickening of the aortic valve. Aortic valve regurgitation is not visualized. Aortic valve sclerosis is present, with no evidence of aortic valve stenosis.  6. The inferior vena cava is normal in size with greater than 50% respiratory variability, suggesting right atrial pressure of 3 mmHg.  7. Agitated saline contrast bubble study was negative, with no evidence of any interatrial shunt. FINDINGS  Left Ventricle: Left ventricular ejection fraction, by estimation, is 60 to 65%. The left ventricle has normal function. The left ventricle has no regional wall motion abnormalities. Strain was performed and the global longitudinal strain is indeterminate. The left ventricular internal cavity size was normal in size. There is no left ventricular hypertrophy. Left ventricular diastolic parameters were normal. Right Ventricle: The right ventricular size is normal. No increase in right ventricular wall thickness. Right ventricular systolic function is normal. Left Atrium: Left atrial size was mildly dilated. Right Atrium: Right atrial size was normal in size. Pericardium: There is no evidence of pericardial effusion. Mitral Valve: The mitral valve is abnormal. There is mild thickening of the mitral valve leaflet(s). Trivial mitral valve regurgitation. No  evidence of mitral valve stenosis. Tricuspid Valve: The tricuspid valve is normal in structure. Tricuspid valve regurgitation is trivial. No evidence of tricuspid stenosis. Aortic Valve: The aortic valve is tricuspid. There is mild calcification of the aortic valve. There is mild thickening of the aortic valve. Aortic valve regurgitation is not visualized. Aortic valve sclerosis is present, with no evidence of aortic valve stenosis. Aortic valve mean gradient measures 3.0 mmHg. Aortic valve peak gradient measures 6.2 mmHg. Aortic valve area, by VTI measures 3.92 cm. Pulmonic Valve: The pulmonic valve was normal in structure. Pulmonic valve regurgitation is not visualized. No evidence of pulmonic stenosis. Aorta: The aortic root is normal in size and structure. Venous: The inferior vena cava is normal in size with greater than 50% respiratory variability, suggesting right atrial pressure of 3 mmHg. IAS/Shunts: No atrial level shunt detected by color flow Doppler. Agitated saline contrast was given intravenously to evaluate for intracardiac shunting. Agitated saline contrast bubble study was negative, with no evidence of any interatrial shunt. Additional Comments: 3D was performed not requiring image post processing on an independent workstation and was indeterminate.  LEFT VENTRICLE PLAX 2D LVIDd:         5.40 cm   Diastology LVIDs:         3.20 cm   LV e' medial:    8.70 cm/s LV PW:         1.30 cm   LV E/e' medial:  9.6 LV IVS:        1.00 cm   LV e' lateral:   11.00 cm/s LVOT diam:     2.20 cm   LV E/e' lateral: 7.6 LV SV:         101 LV SV Index:   42 LVOT Area:     3.80 cm  RIGHT VENTRICLE             IVC RV Basal diam:  3.40 cm     IVC diam: 1.70 cm RV S prime:     17.80 cm/s TAPSE (M-mode): 3.0 cm LEFT ATRIUM  Index LA diam:        4.00 cm 1.64 cm/m LA Vol (A2C):   54.1 ml 22.19 ml/m LA Vol (A4C):   50.3 ml 20.63 ml/m LA Biplane Vol: 54.7 ml 22.44 ml/m  AORTIC VALVE AV Area (Vmax):    3.77 cm  AV Area (Vmean):   3.60 cm AV Area (VTI):     3.92 cm AV Vmax:           125.00 cm/s AV Vmean:          81.400 cm/s AV VTI:            0.259 m AV Peak Grad:      6.2 mmHg AV Mean Grad:      3.0 mmHg LVOT Vmax:         124.00 cm/s LVOT Vmean:        77.000 cm/s LVOT VTI:          0.267 m LVOT/AV VTI ratio: 1.03  AORTA Ao Root diam: 3.60 cm Ao Asc diam:  3.30 cm MITRAL VALVE MV Area (PHT): 4.52 cm    SHUNTS MV Decel Time: 168 msec    Systemic VTI:  0.27 m MV E velocity: 83.30 cm/s  Systemic Diam: 2.20 cm MV A velocity: 62.40 cm/s MV E/A ratio:  1.33 Maude Emmer MD Electronically signed by Maude Emmer MD Signature Date/Time: 07/15/2024/11:44:48 AM    Final    MR BRAIN WO CONTRAST Result Date: 07/14/2024 EXAM: MRI BRAIN WITHOUT CONTRAST 07/14/2024 01:59:29 PM TECHNIQUE: Multiplanar multisequence MRI of the head/brain was performed without the administration of intravenous contrast. COMPARISON: CT of the head dated 07/12/2024. CLINICAL HISTORY: Hx of left PCA with coil. R/O aneurysm or new coil movement. FINDINGS: BRAIN AND VENTRICLES: Scattered foci of restricted diffusion are present within the left cerebral hemisphere, including the precentral and postcentral gyri and the occipital lobe. Numerous scattered foci of increased T2 signal are present within the subcortical and deep cerebral white matter. No intracranial hemorrhage. No mass. No midline shift. No hydrocephalus. The sella is unremarkable. Normal flow voids. ORBITS: Disconjugate gaze. SINUSES AND MASTOIDS: No acute abnormality. BONES AND SOFT TISSUES: A cystic lesion is noted within the right parotid measuring approximately 2 cm in diameter, as seen on the previous study. IMPRESSION: 1. Scattered foci of restricted diffusion within the left cerebral hemisphere, including the precentral and postcentral gyri and the occipital lobe, which are compatible with acute Embolic Nonhemorrhagic infarcts. 2. Numerous scattered foci of increased T2 signal within the  subcortical and deep cerebral white matter. 3. Disconjugate gaze. 4. 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 Cystic neoplasm remains a diagnosis of exclusion. Electronically signed by: evalene coho 07/14/2024 05:36 PM EDT RP Workstation: [id number]   MR ANGIO HEAD WO CONTRAST Result Date: 07/14/2024 EXAM: MR Angiography Head without intravenous Contrast. 07/14/2024 01:59:29 PM TECHNIQUE: Magnetic resonance angiography images of the head without intravenous contrast. Multiplanar 2D and 3D reformatted images are provided for review. COMPARISON: None provided. CLINICAL HISTORY: History of left PCA with coil. Rule out aneurysm or new coil movement. FINDINGS: ANTERIOR CIRCULATION: There is mild stenosis of the anterior genua of the cavernous internal carotid arteries bilaterally. There is no flow-limiting stenosis. No significant stenosis of the anterior cerebral arteries. No significant stenosis of the middle cerebral arteries. No aneurysm. POSTERIOR CIRCULATION: No significant stenosis of the posterior cerebral arteries. No significant stenosis of the basilar artery. No significant stenosis of  the vertebral arteries. No aneurysm. IMPRESSION: 1. No evidence of aneurysm or new coil movement in the left PCA. 2. Mild stenosis of the anterior Genua of the cavernous internal carotid arteries bilaterally, without flow-limiting stenosis. Electronically signed by: evalene coho 07/14/2024 03:01 PM EDT RP Workstation: [id number]   CT Renal Stone Study Result Date: 07/12/2024 CLINICAL DATA:  Abdominal/flank pain, stone suspected EXAM: CT ABDOMEN AND PELVIS WITHOUT CONTRAST TECHNIQUE: Multidetector CT imaging of the abdomen and pelvis was performed following the standard protocol without IV contrast. RADIATION DOSE REDUCTION: This exam was performed according to the departmental dose-optimization program which includes automated exposure  control, adjustment of the mA and/or kV according to patient size and/or use of iterative reconstruction technique. COMPARISON:  None Available. FINDINGS: Lower chest: No acute abnormality. Hepatobiliary: Scattered subcentimeter focal hypodensities in the right and left hepatic lobes are too small to definitively characterize and may represent small cysts. Gallbladder is unremarkable. No biliary dilatation. Pancreas: Unremarkable. No pancreatic ductal dilatation or surrounding inflammatory changes. Spleen: Normal in size without focal abnormality. Adrenals/Urinary Tract: Adrenal glands are unremarkable. Bilateral renal cysts with the largest measuring up to 2.1 cm arising from the superior pole of the right kidney. Nonobstructive 10 mm calculus in the inferior pole of the right kidney. Nonobstructive 5 mm calculus in the inferior pole of the left kidney. No ureteral calculi. No hydronephrosis. Bladder is unremarkable. Stomach/Bowel: Stomach is within normal limits. Appendix appears normal. No evidence of bowel wall thickening, distention, or inflammatory changes. Vascular/Lymphatic: Aortic atherosclerosis. Nonaneurysmal abdominal aorta. No enlarged abdominal or pelvic lymph nodes. Reproductive: Prostate is unremarkable. Other: Small fat containing paraumbilical hernia. No abdominopelvic ascites. No intraperitoneal free air. Musculoskeletal: No acute osseous abnormality. No suspicious osseous lesion. Degenerative disc height loss and endplate osteophytosis at L4-L5 and L5-S1. IMPRESSION: 1. Nonobstructive bilateral renal calculi measuring up to 10 mm on the right. No ureteral calculi. No hydronephrosis. 2.  Aortic Atherosclerosis (ICD10-I70.0). Electronically Signed   By: Harrietta Sherry M.D.   On: 07/12/2024 12:36   CT Head Wo Contrast Result Date: 07/12/2024 CLINICAL DATA:  53 year old male with headache and right side numbness. Dizziness, syncope. Hypotensive. EXAM: CT HEAD WITHOUT CONTRAST TECHNIQUE:  Contiguous axial images were obtained from the base of the skull through the vertex without intravenous contrast. RADIATION DOSE REDUCTION: This exam was performed according to the departmental dose-optimization program which includes automated exposure control, adjustment of the mA and/or kV according to patient size and/or use of iterative reconstruction technique. COMPARISON:  None Available. FINDINGS: Brain: Cerebral volume is normal for age. No midline shift, ventriculomegaly, mass effect, evidence of mass lesion, intracranial hemorrhage or evidence of cortically based acute infarction. Gray-white matter differentiation is within normal limits throughout the brain. Vascular: A distal left ICA siphon vascular stent is visible. No suspicious intracranial vascular hyperdensity. Skull: Intact.  No acute osseous abnormality identified. Sinuses/Orbits: Visualized paranasal sinuses and mastoids are well aerated. Tympanic cavities appear clear. Other: Mildly Disconjugate gaze. Negative visible scalp soft tissues. Partially visible round 2.5 cm cystic appearing mass in the right parotid space, series 3, image 1. no regional inflammation. IMPRESSION: 1. Distal left ICA siphon vascular stent is visible. Normal for age noncontrast CT appearance of the Brain. 2. Partially visible 2.5 cm cystic appearing mass within the right parotid space. 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 Electronically Signed   By: VEAR Hurst M.D.   On: 07/12/2024 10:19   DG Chest Port 1 View Result Date: 07/12/2024 CLINICAL DATA:  Syncope, hypotension EXAM: PORTABLE  CHEST 1 VIEW COMPARISON:  10/25/2018 and CT chest 03/18/2021 FINDINGS: Mid and lower thoracic spondylosis. Heart size within normal limits. Upper zone pulmonary vascular prominence may be due to supine positioning rather than pulmonary venous hypertension. Left costophrenic angle excluded. The included lungs appear clear. IMPRESSION: 1. Upper zone pulmonary vascular prominence may be due to  supine positioning rather than pulmonary venous hypertension. 2. Mid and lower thoracic spondylosis. Electronically Signed   By: Ryan Salvage M.D.   On: 07/12/2024 09:48     PHYSICAL EXAM  Temp:  [98.2 F (36.8 C)-98.8 F (37.1 C)] 98.4 F (36.9 C) (08/03 0915) Pulse Rate:  [56-76] 62 (08/03 0915) Resp:  [19] 19 (08/03 0915) BP: (116-136)/(73-95) 124/81 (08/03 0915) SpO2:  [97 %-99 %] 98 % (08/03 0915)  General - Well nourished, well developed, in no apparent distress.  Ophthalmologic - fundi not visualized due to noncooperation.  Cardiovascular - Regular rhythm and rate.  Mental Status -  Level of arousal and orientation to time, place, and person were intact. Language including expression, naming, repetition, comprehension was assessed and found intact. Attention span and concentration were normal. Fund of Knowledge was assessed and was intact.  Cranial Nerves II - XII - II - Visual field intact OU. III, IV, VI - Extraocular movements intact. V - Facial sensation intact bilaterally. VII - Facial movement intact bilaterally. VIII - Hearing & vestibular intact bilaterally. X - Palate elevates symmetrically. XI - Chin turning & shoulder shrug intact bilaterally. XII - Tongue protrusion intact.  Motor Strength - The patient's strength was normal in all extremities and pronator drift was absent.  Bulk was normal and fasciculations were absent.   Motor Tone - Muscle tone was assessed at the neck and appendages and was normal.  Reflexes - The patient's reflexes were symmetrical in all extremities and he had no pathological reflexes.  Sensory - Light touch, temperature/pinprick were assessed and were symmetrical.    Coordination - The patient had normal movements in the hands and feet with no ataxia or dysmetria.  Tremor was absent.  Gait and Station - deferred.   ASSESSMENT/PLAN Roy [name] is a 54 y.o. male with history of hypertension, hyperlipidemia,  CAD, smoker, left P-comm aneurysm status post coil 2021 admitted for chills, malaise, urinary frequency, right arm weakness and numbness.  EEG had syncope or episode with bradycardia and BP as low as 55/43, received IV fluid.  Found to have AKI, UTI and sepsis.  No TNK given due to outside window.    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  Coronary artery disease Phentermine use - recommend to discontinue given increased risk of stroke  Other Active Problems Left P-comm aneurysm status post coil in 2021 at  [facility] day # 3    Ary Cummins, MD PhD Stroke Neurology 07/16/2024 11:53 AM    To contact Stroke Continuity provider, please refer to [email or web address]. After hours, contact General Neurology

## 2024-07-16 NOTE — Plan of Care (Signed)
  Problem: Clinical Measurements: Goal: Diagnostic test results will improve Outcome: Not Progressing   Problem: Clinical Measurements: Goal: Cardiovascular complication will be avoided Outcome: Not Progressing   Problem: Safety: Goal: Ability to remain free from injury will improve Outcome: Not Progressing

## 2024-07-16 NOTE — Progress Notes (Signed)
   [facility] has been requested to perform a transesophageal echocardiogram on Roy [name] for CVA.  After careful review of history and examination, the risks and benefits of transesophageal echocardiogram have been explained including risks of esophageal damage, perforation (1:10,000 risk), bleeding, pharyngeal hematoma as well as other potential complications associated with conscious sedation including aspiration, arrhythmia, respiratory failure and death. Alternatives to treatment were discussed, questions were answered. Patient is willing to proceed.   Will tentatively plan for tomorrow 08/04.  Thom LITTIE Sluder, PA-C  07/16/2024 9:23 AM

## 2024-07-17 ENCOUNTER — Inpatient Hospital Stay (HOSPITAL_COMMUNITY): Admitting: Anesthesiology

## 2024-07-17 ENCOUNTER — Other Ambulatory Visit (HOSPITAL_COMMUNITY): Payer: Self-pay

## 2024-07-17 ENCOUNTER — Encounter (HOSPITAL_COMMUNITY)
Admission: EM | Disposition: A | Discharge status: Home / Self Care | Payer: Self-pay | Source: Home / Self Care | Attending: Internal Medicine

## 2024-07-17 ENCOUNTER — Encounter (HOSPITAL_COMMUNITY): Payer: Self-pay | Admitting: Internal Medicine

## 2024-07-17 ENCOUNTER — Inpatient Hospital Stay (HOSPITAL_COMMUNITY)

## 2024-07-17 DIAGNOSIS — Z87891 Personal history of nicotine dependence: Secondary | ICD-10-CM | POA: Diagnosis not present

## 2024-07-17 DIAGNOSIS — I639 Cerebral infarction, unspecified: Secondary | ICD-10-CM | POA: Diagnosis not present

## 2024-07-17 DIAGNOSIS — I3139 Other pericardial effusion (noninflammatory): Secondary | ICD-10-CM

## 2024-07-17 DIAGNOSIS — I10 Essential (primary) hypertension: Secondary | ICD-10-CM

## 2024-07-17 DIAGNOSIS — A419 Sepsis, unspecified organism: Secondary | ICD-10-CM | POA: Diagnosis not present

## 2024-07-17 DIAGNOSIS — I959 Hypotension, unspecified: Secondary | ICD-10-CM

## 2024-07-17 HISTORY — PX: LOOP RECORDER INSERTION: EP1214

## 2024-07-17 HISTORY — PX: TRANSESOPHAGEAL ECHOCARDIOGRAM (CATH LAB): EP1270

## 2024-07-17 LAB — BASIC METABOLIC PANEL WITH GFR
Anion gap: 9 (ref 5–15)
BUN: 24 mg/dL — ABNORMAL HIGH (ref 6–20)
CO2: 26 mmol/L (ref 22–32)
Calcium: 9.4 mg/dL (ref 8.9–10.3)
Chloride: 103 mmol/L (ref 98–111)
Creatinine, Ser: 1.4 mg/dL — ABNORMAL HIGH (ref 0.61–1.24)
GFR, Estimated: 60 mL/min — ABNORMAL LOW (ref >60)
Glucose, Bld: 101 mg/dL — ABNORMAL HIGH (ref 70–99)
Potassium: 4.1 mmol/L (ref 3.5–5.1)
Sodium: 138 mmol/L (ref 135–145)

## 2024-07-17 LAB — CBC
HCT: 33.3 % — ABNORMAL LOW (ref 39.0–52.0)
Hemoglobin: 11.2 g/dL — ABNORMAL LOW (ref 13.0–17.0)
MCH: 33 pg (ref 26.0–34.0)
MCHC: 33.6 g/dL (ref 30.0–36.0)
MCV: 98.2 fL (ref 80.0–100.0)
Platelets: 352 K/uL (ref 150–400)
RBC: 3.39 MIL/uL — ABNORMAL LOW (ref 4.22–5.81)
RDW: 13.1 % (ref 11.5–15.5)
WBC: 7.3 K/uL (ref 4.0–10.5)
nRBC: 0 % (ref 0.0–0.2)

## 2024-07-17 LAB — CULTURE, BLOOD (SINGLE)
Culture: NO GROWTH
Special Requests: ADEQUATE

## 2024-07-17 SURGERY — TRANSESOPHAGEAL ECHOCARDIOGRAM (TEE) (CATHLAB)
Anesthesia: Monitor Anesthesia Care

## 2024-07-17 SURGERY — LOOP RECORDER INSERTION
Anesthesia: LOCAL

## 2024-07-17 MED ORDER — LIDOCAINE-EPINEPHRINE 1 %-1:100000 IJ SOLN
INTRAMUSCULAR | Status: DC | PRN
  Administered 2024-07-17: 10 mL via INTRADERMAL

## 2024-07-17 MED ORDER — GLYCOPYRROLATE 0.2 MG/ML IJ SOLN
INTRAMUSCULAR | Status: DC | PRN
Start: 2024-07-17 — End: 2024-07-17
  Administered 2024-07-17: .1 mg via INTRAVENOUS

## 2024-07-17 MED ORDER — ATORVASTATIN CALCIUM 80 MG PO TABS
80.0000 mg | ORAL_TABLET | Freq: Every day | ORAL | 1 refills | Status: AC
  Filled 2024-07-17: qty 30, 30d supply, fill #0

## 2024-07-17 MED ORDER — ASPIRIN 81 MG PO TBEC
81.0000 mg | DELAYED_RELEASE_TABLET | Freq: Every day | ORAL | 1 refills | Status: AC
  Filled 2024-07-17: qty 30, 30d supply, fill #0

## 2024-07-17 MED ORDER — LIDOCAINE-EPINEPHRINE 1 %-1:100000 IJ SOLN
INTRAMUSCULAR | Status: AC
  Filled 2024-07-17: qty 1

## 2024-07-17 MED ORDER — CLOPIDOGREL BISULFATE 75 MG PO TABS
75.0000 mg | ORAL_TABLET | Freq: Every day | ORAL | 0 refills | Status: DC
  Filled 2024-07-17: qty 19, 19d supply, fill #0

## 2024-07-17 MED ORDER — LEVOFLOXACIN 750 MG PO TABS
750.0000 mg | ORAL_TABLET | Freq: Every day | ORAL | 0 refills | Status: DC
  Filled 2024-07-17: qty 3, 3d supply, fill #0

## 2024-07-17 MED ORDER — SODIUM CHLORIDE 0.9 % IV SOLN: INTRAVENOUS | Status: DC

## 2024-07-17 MED ORDER — PROPOFOL 10 MG/ML IV BOLUS
INTRAVENOUS | Status: DC | PRN
Start: 2024-07-17 — End: 2024-07-17
  Administered 2024-07-17: 50 mg via INTRAVENOUS
  Administered 2024-07-17: 150 ug/kg/min via INTRAVENOUS
  Administered 2024-07-17: 50 mg via INTRAVENOUS

## 2024-07-17 MED ORDER — LIDOCAINE 2% (20 MG/ML) 5 ML SYRINGE
INTRAMUSCULAR | Status: DC | PRN
  Administered 2024-07-17: 50 mg via INTRAVENOUS

## 2024-07-17 SURGICAL SUPPLY — 2 items
MONITOR REVEAL LINQ II (Prosthesis & Implant Heart) IMPLANT
PACK LOOP INSERTION (CUSTOM PROCEDURE TRAY) ×1 IMPLANT

## 2024-07-17 NOTE — TOC Progression Note (Signed)
 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 Neurology following. Patient underwent TEE today 07/17/24 by Cardiology. Result showed no Intracardiac source of Embolism.    Patient not Medically ready for discharge.  CM will continue to follow as patient progresses with care towards discharge.           Expected Discharge Plan and Services                                               Social Drivers of Health (SDOH) Interventions SDOH Screenings   Food Insecurity: No Food Insecurity (07/12/2024)  Housing: Low Risk  (07/12/2024)  Transportation Needs: No Transportation Needs (07/12/2024)  Utilities: Not At Risk (07/12/2024)  Depression (PHQ2-9): Low Risk  (10/09/2019)  Financial Resource Strain: Low Risk  (07/29/2020)   Received from [facility]  Social Connections: Unknown (07/12/2024)  Tobacco Use: Medium Risk (07/17/2024)    Readmission Risk Interventions     No data to display

## 2024-07-17 NOTE — Transfer of Care (Signed)
 Immediate Anesthesia Transfer of Care Note  Patient: Roy [name]  Procedure(s) Performed: TRANSESOPHAGEAL ECHOCARDIOGRAM  Patient Location: Cath Lab  Anesthesia Type:MAC  Level of Consciousness: awake, alert , and oriented  Airway & Oxygen Therapy: Patient Spontanous Breathing  Post-op Assessment: Report given to RN and Post -op Vital signs reviewed and stable  Post vital signs: Reviewed and stable  Last Vitals:  Vitals Value Taken Time  BP    Temp    Pulse 73 07/17/24 12:24  Resp 13 07/17/24 12:24  SpO2 98 % 07/17/24 12:24  Vitals shown include unfiled device data.  Last Pain:  Vitals:   07/17/24 1101  TempSrc:   PainSc: 0-No pain      Patients Stated Pain Goal: 0 (07/13/24 0825)  Complications: No notable events documented.

## 2024-07-17 NOTE — Interval H&P Note (Signed)
 History and Physical Interval Note:  07/17/2024 10:59 AM  Roy [name]  has presented today for surgery, with the diagnosis of CVA.  The various methods of treatment have been discussed with the patient and family. After consideration of risks, benefits and other options for treatment, the patient has consented to  Procedure(s): TRANSESOPHAGEAL ECHOCARDIOGRAM (N/A) as a surgical intervention.  The patient's history has been reviewed, patient examined, no change in status, stable for surgery.  I have reviewed the patient's chart and labs.  Questions were answered to the patient's satisfaction.     Vencent Hauschild Lonni

## 2024-07-17 NOTE — Progress Notes (Signed)
 STROKE TEAM PROGRESS NOTE   SUBJECTIVE (INTERVAL HISTORY) Wife is at the bedside.  Patient sitting in chair, neuro intact.  Had TEE today no acute finding.  Loop recorder placed.   OBJECTIVE Temp:  [97.8 F (36.6 C)-98.9 F (37.2 C)] 98.5 F (36.9 C) (08/04 1329) Pulse Rate:  [51-73] 70 (08/04 1329) Cardiac Rhythm: Normal sinus rhythm (08/04 1250) Resp:  [13-19] 16 (08/04 1329) BP: (107-144)/(64-95) 129/91 (08/04 1329) SpO2:  [92 %-100 %] 98 % (08/04 1329)  Recent Labs  Lab 07/12/24 0919  GLUCAP 132*   Recent Labs  Lab 07/13/24 0520 07/14/24 0609 07/15/24 0603 07/16/24 0507 07/17/24 0547  NA 139 141 142 140 138  K 3.9 4.4 4.1 4.3 4.1  CL 105 105 103 102 103  CO2 27 27 27 28 26   GLUCOSE 124* 116* 105* 114* 101*  BUN 25* 20 19 23* 24*  CREATININE 1.40* 1.41* 1.27* 1.37* 1.40*  CALCIUM  8.6* 9.3 9.8 9.8 9.4   Recent Labs  Lab 07/12/24 0925  AST 24  ALT 18  ALKPHOS 44  BILITOT 1.2  PROT 6.6  ALBUMIN 3.4*   Recent Labs  Lab 07/12/24 0925 07/13/24 0520 07/14/24 0609 07/15/24 0603 07/16/24 0507 07/17/24 0547  WBC 12.1* 9.7 5.8 5.1 5.8 7.3  NEUTROABS 11.0*  --  3.5  --   --   --   HGB 13.4 9.6* 10.3* 10.8* 11.4* 11.2*  HCT 40.2 29.1* 30.6* 32.8* 34.3* 33.3*  MCV 99.8 99.3 99.0 98.2 98.3 98.2  PLT 281 259 266 319 322 352   No results for input(s): CKTOTAL, CKMB, CKMBINDEX, TROPONINI in the last 168 hours. No results for input(s): LABPROT, INR in the last 72 hours. No results for input(s): COLORURINE, LABSPEC, PHURINE, GLUCOSEU, HGBUR, BILIRUBINUR, KETONESUR, PROTEINUR, UROBILINOGEN, NITRITE, LEUKOCYTESUR in the last 72 hours.  Invalid input(s): APPERANCEUR     Component Value Date/Time   CHOL 196 07/15/2024 0603   TRIG 129 07/15/2024 0603   HDL 42 07/15/2024 0603   CHOLHDL 4.7 07/15/2024 0603   VLDL 26 07/15/2024 0603   LDLCALC 128 (H) 07/15/2024 0603   Lab Results  Component Value Date   HGBA1C 5.8 (H)  07/13/2024      Component Value Date/Time   LABOPIA NONE DETECTED 07/15/2024 0841   COCAINSCRNUR NONE DETECTED 07/15/2024 0841   COCAINSCRNUR NONE DETECTED 08/17/2018 1635   LABBENZ NONE DETECTED 07/15/2024 0841   AMPHETMU NONE DETECTED 07/15/2024 0841   THCU POSITIVE (A) 07/15/2024 0841   LABBARB NONE DETECTED 07/15/2024 0841    No results for input(s): ETH in the last 168 hours.  I have personally reviewed the radiological images below and agree with the radiology interpretations.  EP PPM/ICD IMPLANT Result Date: 07/17/2024 CONCLUSIONS:  1. Successful implantation of a implantable loop recorder for a history of cryptogenic stroke  2. No early apparent complications. Ozell Prentice Passey, PA-C Cardiac Electrophysiology   EP STUDY Result Date: 07/17/2024 See surgical note for result.  VAS US  LOWER EXTREMITY VENOUS (DVT) Result Date: 07/16/2024  Lower Venous DVT Study Patient Name:  Seiya L [name]  Date of Exam:   07/16/2024 Medical Rec #: [id number]          Accession #:    [id number] Date of Birth: [birth year]         Patient Gender: M Patient Age:   53 years Exam Location:  [facility] Procedure:      VAS US  LOWER EXTREMITY VENOUS (DVT) Referring Phys: GRACE LAU --------------------------------------------------------------------------------  Indications:  Stroke.  Comparison Study: No priors. Performing Technologist: Ricka Sturdivant-Jones RDMS, RVT  Examination Guidelines: A complete evaluation includes B-mode imaging, spectral Doppler, color Doppler, and power Doppler as needed of all accessible portions of each vessel. Bilateral testing is considered an integral part of a complete examination. Limited examinations for reoccurring indications may be performed as noted. The reflux portion of the exam is performed with the patient in reverse Trendelenburg.  +---------+---------------+---------+-----------+----------+--------------+ RIGHT     CompressibilityPhasicitySpontaneityPropertiesThrombus Aging +---------+---------------+---------+-----------+----------+--------------+ CFV      Full           Yes      Yes                                 +---------+---------------+---------+-----------+----------+--------------+ SFJ      Full                                                        +---------+---------------+---------+-----------+----------+--------------+ FV Prox  Full                                                        +---------+---------------+---------+-----------+----------+--------------+ FV Mid   Full                                                        +---------+---------------+---------+-----------+----------+--------------+ FV DistalFull                                                        +---------+---------------+---------+-----------+----------+--------------+ PFV      Full                                                        +---------+---------------+---------+-----------+----------+--------------+ POP      Full           Yes      Yes                                 +---------+---------------+---------+-----------+----------+--------------+ PTV      Full                                                        +---------+---------------+---------+-----------+----------+--------------+ PERO     Full                                                        +---------+---------------+---------+-----------+----------+--------------+   +---------+---------------+---------+-----------+----------+--------------+  LEFT     CompressibilityPhasicitySpontaneityPropertiesThrombus Aging +---------+---------------+---------+-----------+----------+--------------+ CFV      Full           Yes      Yes                                 +---------+---------------+---------+-----------+----------+--------------+ SFJ      Full                                                         +---------+---------------+---------+-----------+----------+--------------+ FV Prox  Full                                                        +---------+---------------+---------+-----------+----------+--------------+ FV Mid   Full                                                        +---------+---------------+---------+-----------+----------+--------------+ FV DistalFull                                                        +---------+---------------+---------+-----------+----------+--------------+ PFV      Full                                                        +---------+---------------+---------+-----------+----------+--------------+ POP      Full           Yes      Yes                                 +---------+---------------+---------+-----------+----------+--------------+ PTV      Full                                                        +---------+---------------+---------+-----------+----------+--------------+ PERO     Full                                                        +---------+---------------+---------+-----------+----------+--------------+     Summary: BILATERAL: - No evidence of deep vein thrombosis seen in the lower extremities, bilaterally. -No evidence of popliteal cyst, bilaterally.   *See table(s) above for measurements and observations. Electronically signed by Debby Robertson on 07/16/2024 at 3:25:50 PM.  Final    CT ANGIO HEAD NECK W WO CM Result Date: 07/15/2024 EXAM: CTA HEAD AND NECK WITH AND WITHOUT 07/15/2024 03:06:50 PM TECHNIQUE: CTA of the head and neck was performed with and without the administration of intravenous contrast. Multiplanar 2D and/or 3D reformatted images are provided for review. Automated exposure control, iterative reconstruction, and/or weight based adjustment of the mA/kV was utilized to reduce the radiation dose to as low as reasonably achievable. Stenosis of the internal carotid  arteries measured using NASCET criteria. COMPARISON: MRI of the head dated 09/13/2024, MRA of the head dated 07/14/2024, and CT of the head dated 07/12/2024. CLINICAL HISTORY: Stroke/TIA, determine embolic source. Chief complaints; Hypotension. FINDINGS: CTA NECK: AORTIC ARCH AND ARCH VESSELS: No dissection or arterial injury. No significant stenosis of the brachiocephalic or subclavian arteries. CERVICAL CAROTID ARTERIES: No dissection, arterial injury, or hemodynamically significant stenosis by NASCET criteria. CERVICAL VERTEBRAL ARTERIES: No dissection, arterial injury, or significant stenosis. LUNGS AND MEDIASTINUM: Unremarkable. 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 BONES: No acute abnormality. CTA HEAD: ANTERIOR CIRCULATION: No significant stenosis of the internal carotid arteries. Endovascular coil is again demonstrated adjacent to the supraclinoid segment of the left internal carotid artery. No residual or recurrent cerebral aneurysm present. POSTERIOR CIRCULATION: The left posterior communicating artery is patent. There is mild stenosis of the left P1 and P2 segments but they are patent. There is a complete Circle of Willis. OTHER: No dural venous sinus thrombosis on this non-dedicated study. IMPRESSION: 1. No large vessel occlusion, hemodynamically significant stenosis, or aneurysm in the head or neck. 2. Mild stenosis of the left P1 and P2 segments. Electronically signed by: evalene coho 07/15/2024 04:42 PM EDT RP Workstation: [id number]   ECHOCARDIOGRAM COMPLETE Result Date: 07/15/2024    ECHOCARDIOGRAM REPORT   Patient Name:   Yahmir L [name] Date of Exam: 07/15/2024 Medical Rec #:  [id number]         Height:       75.0 in Accession #:    [id number]        Weight:       256.2 lb Date of Birth:  12-05-1971        BSA:          2.438 m Patient Age:    52 years          BP:           142/90 mmHg Patient Gender: M                 HR:           53 bpm. Exam  Location:  Inpatient Procedure: 2D Echo, Cardiac Doppler, Color Doppler and Saline Contrast Bubble            Study (Both Spectral and Color Flow Doppler were utilized during            procedure). Indications:    CVA  History:        Patient has prior history of Echocardiogram examinations, most                 recent 08/18/2018. Stroke; Risk Factors:Hypertension and Current                 Smoker.  Sonographer:    Therisa Crouch Referring Phys: [id number] GRACE LAU IMPRESSIONS  1. Left ventricular ejection fraction, by estimation, is 60 to 65%. The left ventricle has normal function. The left ventricle has no regional wall  motion abnormalities. Left ventricular diastolic parameters were normal.  2. Right ventricular systolic function is normal. The right ventricular size is normal.  3. Left atrial size was mildly dilated.  4. The mitral valve is abnormal. Trivial mitral valve regurgitation. No evidence of mitral stenosis.  5. The aortic valve is tricuspid. There is mild calcification of the aortic valve. There is mild thickening of the aortic valve. Aortic valve regurgitation is not visualized. Aortic valve sclerosis is present, with no evidence of aortic valve stenosis.  6. The inferior vena cava is normal in size with greater than 50% respiratory variability, suggesting right atrial pressure of 3 mmHg.  7. Agitated saline contrast bubble study was negative, with no evidence of any interatrial shunt. FINDINGS  Left Ventricle: Left ventricular ejection fraction, by estimation, is 60 to 65%. The left ventricle has normal function. The left ventricle has no regional wall motion abnormalities. Strain was performed and the global longitudinal strain is indeterminate. The left ventricular internal cavity size was normal in size. There is no left ventricular hypertrophy. Left ventricular diastolic parameters were normal. Right Ventricle: The right ventricular size is normal. No increase in right ventricular wall thickness.  Right ventricular systolic function is normal. Left Atrium: Left atrial size was mildly dilated. Right Atrium: Right atrial size was normal in size. Pericardium: There is no evidence of pericardial effusion. Mitral Valve: The mitral valve is abnormal. There is mild thickening of the mitral valve leaflet(s). Trivial mitral valve regurgitation. No evidence of mitral valve stenosis. Tricuspid Valve: The tricuspid valve is normal in structure. Tricuspid valve regurgitation is trivial. No evidence of tricuspid stenosis. Aortic Valve: The aortic valve is tricuspid. There is mild calcification of the aortic valve. There is mild thickening of the aortic valve. Aortic valve regurgitation is not visualized. Aortic valve sclerosis is present, with no evidence of aortic valve stenosis. Aortic valve mean gradient measures 3.0 mmHg. Aortic valve peak gradient measures 6.2 mmHg. Aortic valve area, by VTI measures 3.92 cm. Pulmonic Valve: The pulmonic valve was normal in structure. Pulmonic valve regurgitation is not visualized. No evidence of pulmonic stenosis. Aorta: The aortic root is normal in size and structure. Venous: The inferior vena cava is normal in size with greater than 50% respiratory variability, suggesting right atrial pressure of 3 mmHg. IAS/Shunts: No atrial level shunt detected by color flow Doppler. Agitated saline contrast was given intravenously to evaluate for intracardiac shunting. Agitated saline contrast bubble study was negative, with no evidence of any interatrial shunt. Additional Comments: 3D was performed not requiring image post processing on an independent workstation and was indeterminate.  LEFT VENTRICLE PLAX 2D LVIDd:         5.40 cm   Diastology LVIDs:         3.20 cm   LV e' medial:    8.70 cm/s LV PW:         1.30 cm   LV E/e' medial:  9.6 LV IVS:        1.00 cm   LV e' lateral:   11.00 cm/s LVOT diam:     2.20 cm   LV E/e' lateral: 7.6 LV SV:         101 LV SV Index:   42 LVOT Area:     3.80  cm  RIGHT VENTRICLE             IVC RV Basal diam:  3.40 cm     IVC diam: 1.70 cm RV S prime:  17.80 cm/s TAPSE (M-mode): 3.0 cm LEFT ATRIUM             Index LA diam:        4.00 cm 1.64 cm/m LA Vol (A2C):   54.1 ml 22.19 ml/m LA Vol (A4C):   50.3 ml 20.63 ml/m LA Biplane Vol: 54.7 ml 22.44 ml/m  AORTIC VALVE AV Area (Vmax):    3.77 cm AV Area (Vmean):   3.60 cm AV Area (VTI):     3.92 cm AV Vmax:           125.00 cm/s AV Vmean:          81.400 cm/s AV VTI:            0.259 m AV Peak Grad:      6.2 mmHg AV Mean Grad:      3.0 mmHg LVOT Vmax:         124.00 cm/s LVOT Vmean:        77.000 cm/s LVOT VTI:          0.267 m LVOT/AV VTI ratio: 1.03  AORTA Ao Root diam: 3.60 cm Ao Asc diam:  3.30 cm MITRAL VALVE MV Area (PHT): 4.52 cm    SHUNTS MV Decel Time: 168 msec    Systemic VTI:  0.27 m MV E velocity: 83.30 cm/s  Systemic Diam: 2.20 cm MV A velocity: 62.40 cm/s MV E/A ratio:  1.33 Maude Emmer MD Electronically signed by Maude Emmer MD Signature Date/Time: 07/15/2024/11:44:48 AM    Final    MR BRAIN WO CONTRAST Result Date: 07/14/2024 EXAM: MRI BRAIN WITHOUT CONTRAST 07/14/2024 01:59:29 PM TECHNIQUE: Multiplanar multisequence MRI of the head/brain was performed without the administration of intravenous contrast. COMPARISON: CT of the head dated 07/12/2024. CLINICAL HISTORY: Hx of left PCA with coil. R/O aneurysm or new coil movement. FINDINGS: BRAIN AND VENTRICLES: Scattered foci of restricted diffusion are present within the left cerebral hemisphere, including the precentral and postcentral gyri and the occipital lobe. Numerous scattered foci of increased T2 signal are present within the subcortical and deep cerebral white matter. No intracranial hemorrhage. No mass. No midline shift. No hydrocephalus. The sella is unremarkable. Normal flow voids. ORBITS: Disconjugate gaze. SINUSES AND MASTOIDS: No acute abnormality. BONES AND SOFT TISSUES: A cystic lesion is noted within the right parotid measuring  approximately 2 cm in diameter, as seen on the previous study. IMPRESSION: 1. Scattered foci of restricted diffusion within the left cerebral hemisphere, including the precentral and postcentral gyri and the occipital lobe, which are compatible with acute Embolic Nonhemorrhagic infarcts. 2. Numerous scattered foci of increased T2 signal within the subcortical and deep cerebral white matter. 3. Disconjugate gaze. 4. 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 Cystic neoplasm remains a diagnosis of exclusion. Electronically signed by: evalene coho 07/14/2024 05:36 PM EDT RP Workstation: [id number]   MR ANGIO HEAD WO CONTRAST Result Date: 07/14/2024 EXAM: MR Angiography Head without intravenous Contrast. 07/14/2024 01:59:29 PM TECHNIQUE: Magnetic resonance angiography images of the head without intravenous contrast. Multiplanar 2D and 3D reformatted images are provided for review. COMPARISON: None provided. CLINICAL HISTORY: History of left PCA with coil. Rule out aneurysm or new coil movement. FINDINGS: ANTERIOR CIRCULATION: There is mild stenosis of the anterior genua of the cavernous internal carotid arteries bilaterally. There is no flow-limiting stenosis. No significant stenosis of the anterior cerebral arteries. No significant stenosis of the middle cerebral arteries. No aneurysm. POSTERIOR CIRCULATION:  No significant stenosis of the posterior cerebral arteries. No significant stenosis of the basilar artery. No significant stenosis of the vertebral arteries. No aneurysm. IMPRESSION: 1. No evidence of aneurysm or new coil movement in the left PCA. 2. Mild stenosis of the anterior Genua of the cavernous internal carotid arteries bilaterally, without flow-limiting stenosis. Electronically signed by: evalene coho 07/14/2024 03:01 PM EDT RP Workstation: [id number]   CT Renal Stone Study Result Date: 07/12/2024 CLINICAL DATA:   Abdominal/flank pain, stone suspected EXAM: CT ABDOMEN AND PELVIS WITHOUT CONTRAST TECHNIQUE: Multidetector CT imaging of the abdomen and pelvis was performed following the standard protocol without IV contrast. RADIATION DOSE REDUCTION: This exam was performed according to the departmental dose-optimization program which includes automated exposure control, adjustment of the mA and/or kV according to patient size and/or use of iterative reconstruction technique. COMPARISON:  None Available. FINDINGS: Lower chest: No acute abnormality. Hepatobiliary: Scattered subcentimeter focal hypodensities in the right and left hepatic lobes are too small to definitively characterize and may represent small cysts. Gallbladder is unremarkable. No biliary dilatation. Pancreas: Unremarkable. No pancreatic ductal dilatation or surrounding inflammatory changes. Spleen: Normal in size without focal abnormality. Adrenals/Urinary Tract: Adrenal glands are unremarkable. Bilateral renal cysts with the largest measuring up to 2.1 cm arising from the superior pole of the right kidney. Nonobstructive 10 mm calculus in the inferior pole of the right kidney. Nonobstructive 5 mm calculus in the inferior pole of the left kidney. No ureteral calculi. No hydronephrosis. Bladder is unremarkable. Stomach/Bowel: Stomach is within normal limits. Appendix appears normal. No evidence of bowel wall thickening, distention, or inflammatory changes. Vascular/Lymphatic: Aortic atherosclerosis. Nonaneurysmal abdominal aorta. No enlarged abdominal or pelvic lymph nodes. Reproductive: Prostate is unremarkable. Other: Small fat containing paraumbilical hernia. No abdominopelvic ascites. No intraperitoneal free air. Musculoskeletal: No acute osseous abnormality. No suspicious osseous lesion. Degenerative disc height loss and endplate osteophytosis at L4-L5 and L5-S1. IMPRESSION: 1. Nonobstructive bilateral renal calculi measuring up to 10 mm on the right. No  ureteral calculi. No hydronephrosis. 2.  Aortic Atherosclerosis (ICD10-I70.0). Electronically Signed   By: Harrietta Sherry M.D.   On: 07/12/2024 12:36   CT Head Wo Contrast Result Date: 07/12/2024 CLINICAL DATA:  53 year old male with headache and right side numbness. Dizziness, syncope. Hypotensive. EXAM: CT HEAD WITHOUT CONTRAST TECHNIQUE: Contiguous axial images were obtained from the base of the skull through the vertex without intravenous contrast. RADIATION DOSE REDUCTION: This exam was performed according to the departmental dose-optimization program which includes automated exposure control, adjustment of the mA and/or kV according to patient size and/or use of iterative reconstruction technique. COMPARISON:  None Available. FINDINGS: Brain: Cerebral volume is normal for age. No midline shift, ventriculomegaly, mass effect, evidence of mass lesion, intracranial hemorrhage or evidence of cortically based acute infarction. Gray-white matter differentiation is within normal limits throughout the brain. Vascular: A distal left ICA siphon vascular stent is visible. No suspicious intracranial vascular hyperdensity. Skull: Intact.  No acute osseous abnormality identified. Sinuses/Orbits: Visualized paranasal sinuses and mastoids are well aerated. Tympanic cavities appear clear. Other: Mildly Disconjugate gaze. Negative visible scalp soft tissues. Partially visible round 2.5 cm cystic appearing mass in the right parotid space, series 3, image 1. no regional inflammation. IMPRESSION: 1. Distal left ICA siphon vascular stent is visible. Normal for age noncontrast CT appearance of the Brain. 2. Partially visible 2.5 cm cystic appearing mass within the right parotid space. 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 Electronically Signed   By: VEAR Hurst M.D.   On:  07/12/2024 10:19   DG Chest Port 1 View Result Date: 07/12/2024 CLINICAL DATA:  Syncope, hypotension EXAM: PORTABLE CHEST 1 VIEW COMPARISON:  10/25/2018 and CT  chest 03/18/2021 FINDINGS: Mid and lower thoracic spondylosis. Heart size within normal limits. Upper zone pulmonary vascular prominence may be due to supine positioning rather than pulmonary venous hypertension. Left costophrenic angle excluded. The included lungs appear clear. IMPRESSION: 1. Upper zone pulmonary vascular prominence may be due to supine positioning rather than pulmonary venous hypertension. 2. Mid and lower thoracic spondylosis. Electronically Signed   By: Ryan Salvage M.D.   On: 07/12/2024 09:48     PHYSICAL EXAM  Temp:  [97.8 F (36.6 C)-98.9 F (37.2 C)] 98.5 F (36.9 C) (08/04 1329) Pulse Rate:  [51-73] 70 (08/04 1329) Resp:  [13-19] 16 (08/04 1329) BP: (107-144)/(64-95) 129/91 (08/04 1329) SpO2:  [92 %-100 %] 98 % (08/04 1329)  General - Well nourished, well developed, in no apparent distress.  Ophthalmologic - fundi not visualized due to noncooperation.  Cardiovascular - Regular rhythm and rate.  Mental Status -  Level of arousal and orientation to time, place, and person were intact. Language including expression, naming, repetition, comprehension was assessed and found intact. Attention span and concentration were normal. Fund of Knowledge was assessed and was intact.  Cranial Nerves II - XII - II - Visual field intact OU. III, IV, VI - Extraocular movements intact. V - Facial sensation intact bilaterally. VII - Facial movement intact bilaterally. VIII - Hearing & vestibular intact bilaterally. X - Palate elevates symmetrically. XI - Chin turning & shoulder shrug intact bilaterally. XII - Tongue protrusion intact.  Motor Strength - The patient's strength was normal in all extremities and pronator drift was absent.  Bulk was normal and fasciculations were absent.   Motor Tone - Muscle tone was assessed at the neck and appendages and was normal.  Reflexes - The patient's reflexes were symmetrical in all extremities and he had no pathological  reflexes.  Sensory - Light touch, temperature/pinprick were assessed and were symmetrical.    Coordination - The patient had normal movements in the hands and feet with no ataxia or dysmetria.  Tremor was absent.  Gait and Station - deferred.   ASSESSMENT/PLAN [name] is a 53 y.o. male with history of hypertension, hyperlipidemia, CAD, smoker, left P-comm aneurysm status post coil 2021 admitted for chills, malaise, urinary frequency, right arm weakness and numbness.  EEG had syncope or episode with bradycardia and BP as low as 55/43, received IV fluid.  Found to have AKI, UTI and sepsis.  No TNK given due to outside window.    Stroke:  left MCA scattered infarct embolic secondary to unclear source CT no acute abnormality MRI left MCA scattered infarcts MRA head negative CTA head and neck unremarkable, mild stenosis left P1 and P2 2D Echo EF 60 to 65%, no PFO LE venous Doppler no DVT TEE unremarkable, no PFO Loop recorder placed LDL 128 HgbA1c 5.8 UDS positive for THC Lovenox  for VTE prophylaxis No antithrombotic prior to admission, now on aspirin  81 mg daily and Plavix  DAPT for 3 weeks and then ASA alone.  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  Coronary artery disease Phentermine use - recommend to discontinue given increased risk of stroke  Other Active Problems Left P-comm aneurysm status post coil in 2021 at [facility] day # 4  Neurology will sign off. Please call with questions. Pt will follow up with stroke clinic NP at [facility] in about 4 weeks. Thanks for the consult.   Ary Cummins, MD PhD Stroke Neurology 07/17/2024 6:24 PM    To contact Stroke Continuity provider, please refer to [email or web address]. After hours, contact General Neurology

## 2024-07-17 NOTE — Anesthesia Preprocedure Evaluation (Addendum)
 Anesthesia Evaluation  Patient identified by MRN, date of birth, ID band Patient awake    Reviewed: Allergy & Precautions, NPO status , Patient's Chart, lab work & pertinent test results  History of Anesthesia Complications Negative for: history of anesthetic complications  Airway Mallampati: III  TM Distance: >3 FB Neck ROM: Full    Dental  (+) Dental Advisory Given   Pulmonary pneumonia (recent pna with large residual pleural effusion), former smoker   Pulmonary exam normal breath sounds clear to auscultation       Cardiovascular hypertension, Pt. on home beta blockers and Pt. on medications  Rhythm:Regular Rate:Normal  Echo 07/2024  1. Left ventricular ejection fraction, by estimation, is 60 to 65%. The left ventricle has normal function. The left ventricle has no regional wall motion abnormalities. Left ventricular diastolic parameters were normal.   2. Right ventricular systolic function is normal. The right ventricular size is normal.   3. Left atrial size was mildly dilated.   4. The mitral valve is abnormal. Trivial mitral valve regurgitation. No evidence of mitral stenosis.   5. The aortic valve is tricuspid. There is mild calcification of the aortic valve. There is mild thickening of the aortic valve. Aortic valve regurgitation is not visualized. Aortic valve sclerosis is present, with no evidence of aortic valve stenosis.   6. The inferior vena cava is normal in size with greater than 50% respiratory variability, suggesting right atrial pressure of 3 mmHg.   7. Agitated saline contrast bubble study was negative, with no evidence of any interatrial shunt.     TTE 08/17/18: Study Conclusions  - Left ventricle: The cavity size was normal. There was moderate   concentric hypertrophy. The estimated ejection fraction was 70%.   Wall motion was normal; there were no regional wall motion   abnormalities. Doppler parameters are  consistent with abnormal   left ventricular relaxation (grade 1 diastolic dysfunction). - Aortic valve: Valve area (VTI): 3.19 cm^2. Valve area (Vmax):   2.69 cm^2. Valve area (Vmean): 3.12 cm^2. - Mitral valve: There was mild regurgitation. Valve area by   continuity equation (using LVOT flow): 2.77 cm^2. - Left atrium: The atrium was mildly dilated. - Right ventricle: The cavity size was mildly dilated.  Impressions:  - Normal LVEF, normal wallmotion, diastlic dysfunction   Neuro/Psych Cerebral aneurysm s/p coiling  CVA  negative psych ROS   GI/Hepatic negative GI ROS, Neg liver ROS,neg GERD  ,,  Endo/Other  negative endocrine ROS    Renal/GU Renal disease     Musculoskeletal negative musculoskeletal ROS (+)    Abdominal  (+) + obese  Peds  Hematology negative hematology ROS (+)   Anesthesia Other Findings   Reproductive/Obstetrics                              Anesthesia Physical Anesthesia Plan  ASA: 3  Anesthesia Plan: MAC   Post-op Pain Management: Minimal or no pain anticipated   Induction: Intravenous  PONV Risk Score and Plan: 2 and Ondansetron , Treatment may vary due to age or medical condition, TIVA and Propofol  infusion  Airway Management Planned: Natural Airway  Additional Equipment:   Intra-op Plan:   Post-operative Plan:   Informed Consent: I have reviewed the patients History and Physical, chart, labs and discussed the procedure including the risks, benefits and alternatives for the proposed anesthesia with the patient or authorized representative who has indicated his/her understanding and acceptance.  Dental advisory given  Plan Discussed with: CRNA  Anesthesia Plan Comments:          Anesthesia Quick Evaluation

## 2024-07-17 NOTE — Anesthesia Procedure Notes (Signed)
 Procedure Name: MAC Date/Time: 07/17/2024 11:50 AM  Performed by: Hedy Jarred, CRNAPre-anesthesia Checklist: Patient identified, Emergency Drugs available, Suction available, Patient being monitored and Timeout performed Patient Re-evaluated:Patient Re-evaluated prior to induction Oxygen Delivery Method: Simple face mask

## 2024-07-17 NOTE — Progress Notes (Signed)
 Transported patient to [facility] pharmacy and entrance A to meet family to be transported home.

## 2024-07-17 NOTE — CV Procedure (Signed)
    TRANSESOPHAGEAL ECHOCARDIOGRAM   NAME:  Oland L [place]   MRN: [id number] DOB:  10-18-1971   ADMIT DATE: 07/12/2024  INDICATIONS: Embolic CVA  PROCEDURE:   Informed consent was obtained prior to the procedure. The risks, benefits and alternatives for the procedure were discussed and the patient comprehended these risks.  Risks include, but are not limited to, cough, sore throat, vomiting, nausea, somnolence, esophageal and stomach trauma or perforation, bleeding, low blood pressure, aspiration, pneumonia, infection, trauma to the teeth and death.    Procedural time out performed. The oropharynx was anesthetized with topical 2% cetacaine.    Patient received monitored anesthesia care under the supervision of Dr. Darlyn. Patient received a total of 431.2 mg propofol  during the procedure.  The transesophageal probe was inserted in the esophagus and stomach without difficulty and multiple views were obtained.    COMPLICATIONS:    There were no immediate complications.  FINDINGS:  LEFT VENTRICLE: EF = 60-65%. No regional wall motion abnormalities.  RIGHT VENTRICLE: Normal size and function.   LEFT ATRIUM: No thrombus/mass.  LEFT ATRIAL APPENDAGE: No thrombus/mass.   RIGHT ATRIUM: No thrombus/mass.  AORTIC VALVE:  Trileaflet. Very trivial regurgitation. No vegetation.  MITRAL VALVE:    Normal structure (slight bowing of P2 without prolapse), mild thickening. Trivial regurgitation. No vegetation.  TRICUSPID VALVE: Normal structure. Trivial regurgitation. No vegetation.  PULMONIC VALVE: Grossly normal structure. Trivial regurgitation. No apparent vegetation.  INTERATRIAL SEPTUM: No PFO or ASD seen by color Doppler. Agitated saline contrast negative for right to left shunt.  PERICARDIUM: Small effusion noted.  DESCENDING AORTA: No significant plaque seen   CONCLUSION: No intracardiac source of embolism   Shelda Bruckner, MD, PhD, [facility]  [facility]  [facility] at [facility] [address], [address] [place], [place] [address] [phone]   12:18 PM

## 2024-07-17 NOTE — Progress Notes (Signed)
  Echocardiogram Echocardiogram Transesophageal has been performed.  Roy [name] 07/17/2024, 12:26 PM

## 2024-07-17 NOTE — Consult Note (Addendum)
 ELECTROPHYSIOLOGY CONSULT NOTE  Patient ID: Roy [name] MRN: [id number], DOB/AGE: May 03, 1971   Admit date: 07/12/2024 Date of Consult: 07/17/2024  Primary Physician: Rollene Therisa BRAVO, FNP (Inactive) Primary Cardiologist: None  Primary Electrophysiologist: New to Dr. Kennyth  Reason for Consultation: Cryptogenic stroke; recommendations regarding Implantable Loop Recorder Insurance: Medicaid  History of Present Illness EP has been asked to evaluate Roy [name] for placement of an implantable loop recorder to monitor for atrial fibrillation by Dr Jerri.  The patient was admitted on 07/12/2024 with right arm weakness and numbness.    Noted to also have sepsis in setting of UTI with positive UCx. Treated with ABx   He has undergone workup for stroke including:  Stroke:  left MCA scattered infarct embolic secondary to unclear source CT no acute abnormality MRI left MCA scattered infarcts MRA head negative CTA head and neck unremarkable, mild stenosis left P1 and P2 2D Echo EF 60 to 65%, no PFO LE venous Doppler no DVT Recommend loop recorder LDL 128 HgbA1c 5.8 UDS positive for THC Lovenox  for VTE prophylaxis No antithrombotic prior to admission, now on aspirin  81 mg daily and Plavix  DAPT for 3 weeks and then ASA alone.  Patient counseled to be compliant with his antithrombotic medications Ongoing aggressive stroke risk factor management Therapy recommendations: None Disposition: Pending    The patient has been monitored on telemetry which has demonstrated sinus rhythm with no arrhythmias.  Inpatient stroke work-up required a TEE per Neurology which occurred this am.  TEE 8/4 LEFT VENTRICLE: EF = 60-65%. No regional wall motion abnormalities. RIGHT VENTRICLE: Normal size and function.  LEFT ATRIUM: No thrombus/mass. LEFT ATRIAL APPENDAGE: No thrombus/mass.  RIGHT ATRIUM: No thrombus/mass. AORTIC VALVE:  Trileaflet. Very trivial regurgitation. No vegetation. MITRAL  VALVE:    Normal structure (slight bowing of P2 without prolapse), mild thickening. Trivial regurgitation. No vegetation. TRICUSPID VALVE: Normal structure. Trivial regurgitation. No vegetation. PULMONIC VALVE: Grossly normal structure. Trivial regurgitation. No apparent vegetation. INTERATRIAL SEPTUM: No PFO or ASD seen by color Doppler. Agitated saline contrast negative for right to left shunt. PERICARDIUM: Small effusion noted. DESCENDING AORTA: No significant plaque seen  Prior to admission, the patient denies chest pain, shortness of breath, dizziness, palpitations, or syncope.  He is recovering from his stroke with plans to return home  at discharge.  Allergies, Past Medical, Surgical, Social, and Family Histories have been reviewed and are referenced here-in when relevant for medical decision making.   Inpatient Medications:   aspirin  EC  81 mg Oral Daily   atorvastatin   80 mg Oral QHS   budesonide -glycopyrrolate -formoterol   2 puff Inhalation BID   clopidogrel   75 mg Oral Daily   enoxaparin  (LOVENOX ) injection  40 mg Subcutaneous Q24H   levofloxacin   750 mg Oral Daily   polyethylene glycol  17 g Oral Daily   senna-docusate  2 tablet Oral BID    Physical Exam: Vitals:   07/17/24 1230 07/17/24 1240 07/17/24 1250 07/17/24 1329  BP: 110/72 110/64 117/78 (!) 129/91  Pulse: 66 (!) 51 (!) 57 70  Resp: 15 14 19 16   Temp:    98.5 F (36.9 C)  TempSrc:    Oral  SpO2: 98% 98% 99% 98%  Weight:      Height:        GEN- NAD. A&O x 3. Normal affect. HEENT: Normocephalic, atraumatic Lungs- CTAB, Normal effort.  Heart- Regular rate and rhythm rate and rhythm. No M/G/R.  Extremities- No peripheral edema. no clubbing  or cyanosis Skin- warm and dry, no rash or lesion. Neuro - No focal deficits persist   12-lead ECG on arrival showed NSR at 67 bpm (personally reviewed) All prior EKG's in EPIC reviewed with no documented atrial fibrillation  Telemetry SB/NSR 50-70s (personally  reviewed)  Assessment and Plan:  1. Cryptogenic stroke The patient presents with cryptogenic stroke.  The patient underwent TEE this AM with no thrombus or PFO.  I spoke at length with the patient about monitoring for afib with an implantable loop recorder, including monthly monitoring fees which range from $0-40 typically.  Risks, benefits, and alteratives to implantable loop recorder were discussed with the patient today.   At this time, the patient is very clear in their decision to proceed with implantable loop recorder.    Wound care was reviewed with the patient (keep incision clean and dry for 3 days). Please call with questions.   Ozell Prentice Passey, PA-C 07/17/2024 2:25 PM  I have seen, examined the patient, and reviewed the above assessment and plan.    HPI: [name] is a 53 year old male with past medical history notable for hypertension, hyperlipidemia, nonobstructive CAD, cerebral aneurysm post coil and prediabetes who presented to the ED on 7/30 with right arm/hand numbness and weakness.  Imaging showed left MCA scattered infarct, concerning for a cardioembolic source.  EP has been asked to implant loop recorder.  He denies any known history of atrial fibrillation.  General: Well developed, in no acute distress.  Neck: No JVD.  Cardiac: Normal rate, regular rhythm.  Resp: Normal work of breathing.  Ext: No edema.  Neuro: No gross focal deficits.  Psych: Normal affect.   Assessment and Plan:  #. Cryptogenic stroke: Asked by neurology to implant loop recorder for AF monitoring.  Discussed role of atrial fibrillation as etiology for strokes.  Discussed role of loop recorder for extended monitoring for atrial fibrillation.  Patient voiced understanding.  Discussed risk and benefits of loop recorder implant including but not limited to infection, bleeding, damage to nearby structures.  Patient again voiced understanding and wants to proceed. - Range Loop recorder implant  today, prior to discharge.  #.  Sinus bradycardia: Intermittent, appears to be asymptomatic. -Will monitor for symptomatic bradycardia and/or AV block with loop recorder.  Fonda Kitty, MD 07/17/2024 3:20 PM

## 2024-07-18 NOTE — Anesthesia Postprocedure Evaluation (Signed)
 Anesthesia Post Note  Patient: Roy [name]  Procedure(s) Performed: TRANSESOPHAGEAL ECHOCARDIOGRAM     Patient location during evaluation: Cath Lab Anesthesia Type: MAC Level of consciousness: awake and alert Pain management: pain level controlled Vital Signs Assessment: post-procedure vital signs reviewed and stable Respiratory status: spontaneous breathing Cardiovascular status: stable Anesthetic complications: no   No notable events documented.  Last Vitals:  Vitals:   07/17/24 1250 07/17/24 1329  BP: 117/78 (!) 129/91  Pulse: (!) 57 70  Resp: 19 16  Temp:  36.9 C  SpO2: 99% 98%    Last Pain:  Vitals:   07/17/24 1446  TempSrc:   PainSc: 0-No pain                 Norleen Pope

## 2024-07-19 DIAGNOSIS — I959 Hypotension, unspecified: Secondary | ICD-10-CM

## 2024-07-21 ENCOUNTER — Telehealth: Payer: Self-pay

## 2024-07-21 DIAGNOSIS — I63412 Cerebral infarction due to embolism of left middle cerebral artery: Secondary | ICD-10-CM

## 2024-07-21 NOTE — Patient Instructions (Signed)
 Visit Information  Thank you for taking time to visit with me today. Please don't hesitate to contact me if I can be of assistance to you.  Stroke Symptoms Sudden numbness or weakness in the face, arm, or leg, especially on one side of the body. Sudden confusion, trouble speaking, or difficulty understanding speech. Sudden trouble seeing in one or both eyes. Sudden trouble walking, dizziness, loss of balance, or lack of coordination. Sudden severe headache with no known cause. Call 9-1-1 right away if you or someone else has any of these symptoms.  Patient verbalizes understanding of instructions and care plan provided today and agrees to view in MyChart. Active MyChart status and patient understanding of how to access instructions and care plan via MyChart confirmed with patient.     The patient has been provided with contact information for the care management team and has been advised to call with any health related questions or concerns.   Please call the care guide team at [phone] if you need to cancel or reschedule your appointment.   Please call the Suicide and Crisis Lifeline: 988 if you are experiencing a Mental Health or Behavioral Health Crisis or need someone to talk to.  Cassiopeia Florentino J. Kary Sugrue RN, MSN [facility], [facility] Health RN Care Manager Direct Dial: [phone]  Fax: [phone] Website: [email or web address]

## 2024-07-21 NOTE — Transitions of Care (Post Inpatient/ED Visit) (Signed)
 Stroke Discharge Follow-up   07/21/2024 Name:  Roy [name] MRN:  [id number] DOB:  03-05-1971  Subjective: [name] is a 53 y.o. year old male who is a primary care patient of Rollene Therisa BRAVO, FNP (Inactive) An Emmi alert was received indicating patient responded to questions: Scheduled a follow-up appointment?. I reached out by phone to follow up on the alert.  No answer- voice message left.   Care Management Interventions: Reviewed red alert and attempted to reach patient.  Follow up plan: RN CM will attempt outreach on the next business day.

## 2024-07-21 NOTE — Transitions of Care (Post Inpatient/ED Visit) (Signed)
 Stroke Discharge Follow-up   07/21/2024 Name:  Roy [name] MRN:  [id number] DOB:  23-Jun-1971  Subjective: Roy [name] is a 53 y.o. year old male who is a primary care patient of Rollene Therisa BRAVO, FNP (Inactive) An Emmi alert was received indicating patient responded to questions: Scheduled a follow-up appointment?. I reached out by phone to follow up on the alert and spoke to Patient.  Care Management Interventions: Reviewed alert and appointments with patient.    Stroke Symptoms Sudden numbness or weakness in the face, arm, or leg, especially on one side of the body. Sudden confusion, trouble speaking, or difficulty understanding speech. Sudden trouble seeing in one or both eyes. Sudden trouble walking, dizziness, loss of balance, or lack of coordination. Sudden severe headache with no known cause. Call 9-1-1 right away if you or someone else has any of these symptoms.  Advised patient that they would continue to get automated EMMI-Stroke post discharge calls to assess how they are doing following recent hospitalization and will receive a call from a nurse if any of their responses were abnormal. Patient voiced understanding and was appreciative of follow up call.  Follow up plan: No further intervention required.   Hans Rusher J. Kalum Minner RN, MSN [facility], [facility] Health RN Care Manager Direct Dial: [phone]  Fax: [phone] Website: [email or web address]

## 2024-07-24 ENCOUNTER — Other Ambulatory Visit: Payer: Self-pay

## 2024-07-24 ENCOUNTER — Ambulatory Visit

## 2024-07-24 VITALS — BP 134/83 | HR 67 | Temp 98.7°F | Ht 75.0 in | Wt 253.0 lb

## 2024-07-24 DIAGNOSIS — R7303 Prediabetes: Secondary | ICD-10-CM | POA: Diagnosis not present

## 2024-07-24 DIAGNOSIS — K5900 Constipation, unspecified: Secondary | ICD-10-CM | POA: Diagnosis not present

## 2024-07-24 DIAGNOSIS — E785 Hyperlipidemia, unspecified: Secondary | ICD-10-CM | POA: Diagnosis not present

## 2024-07-24 DIAGNOSIS — N179 Acute kidney failure, unspecified: Secondary | ICD-10-CM

## 2024-07-24 DIAGNOSIS — R6520 Severe sepsis without septic shock: Secondary | ICD-10-CM

## 2024-07-24 DIAGNOSIS — M7662 Achilles tendinitis, left leg: Secondary | ICD-10-CM | POA: Diagnosis not present

## 2024-07-24 DIAGNOSIS — I639 Cerebral infarction, unspecified: Secondary | ICD-10-CM | POA: Diagnosis not present

## 2024-07-24 DIAGNOSIS — A419 Sepsis, unspecified organism: Secondary | ICD-10-CM

## 2024-07-24 DIAGNOSIS — N200 Calculus of kidney: Secondary | ICD-10-CM | POA: Diagnosis not present

## 2024-07-24 DIAGNOSIS — I10 Essential (primary) hypertension: Secondary | ICD-10-CM

## 2024-07-24 NOTE — Assessment & Plan Note (Addendum)
 CT Renal Stone Study: nonobstructive bilaterally renal calculi measuring up to 10 mm on the right. No ureteral calculi. No hydronephrosis. No current complaints, dysuria, flank pain, or hematuria.  -f/u with urology 08/28/2024

## 2024-07-24 NOTE — Assessment & Plan Note (Addendum)
 MR brain in hospital: scattered foci of restricted diffusion within the left cerebral hemisphere, including precentral and postcentral gyri and occipital lobe compatible with acute embolic nonhemorrhagic infarcts. Cystic Lesion within right parotid measuring 2 cm in diameter, stable.  CT brain in hospital: no large vessel occlusion, hemodynamically significant stenosis, or aneurysm, mild stenosis of Left P1 and P2 segments; Bubble study: negative; TEE: Negative   -continue ASA 81 mg indefinitely  -continue Clopidogrel  75 mg until completed 21 days -f/u with neurology 08/21/2024

## 2024-07-24 NOTE — Assessment & Plan Note (Addendum)
 Patient currently holding all HTN medications. In setting of AKI and continued stable BP 134/83 -continue holding BP medications -BMP ordered -instructed patient to avoid NSAIDs

## 2024-07-24 NOTE — Progress Notes (Signed)
 Follow up Hospitalization  Patient was admitted to [facility] on 07/12/2024 and discharged on 07/17/2024. He was treated for Sepsis, Essential HTN, History of cerebral Aneurysm repair, nephrolithiasis, UTI, AKI, and CVA. Treatment for this included  ---Acute Left Cerebral Infarct (Embolic): CTA Head and neck showed no large vessel occlusion, hemodynamically significant stenosis, or aneurysm in head or neck + mild stenosis of Left P1 and P2 segments. Echo EF 60-65% with negative bubble study. MRI showed scattered area of restricted diffusion in left cerebral hemisphere. Negative venous doppler, TEE negative. Loop Recorder Placed. Medications: ASA 81 mg and Plavix  75 mg; Atorvastatin  increased to 80 mg.  ---Sepsis 2/2 Citrobacter UTI + Bilateral nonobstructing renal stones: Hypotensive on admit. IVF with BP on discharge 132/77. CT renal study showed up to 10 mm (R) bilateral with no ureteral calculi or hydronephrosis. IV Ceftriazone then PO Levaquin  750 mg daily for 7 days.   ---AKI vs CKD:  Cr elevated on admission to 2.42. Cr 1.40 on day of discharge. Baseline noted 0.85 from 2019.  He reports excellent compliance with treatment. He reports this condition is resolved.  Patient has the following appointments scheduled:  Cardiology: 08/21/2024, 09/21/2024, 10/23/2024, 11/23/2024, and 12/25/2024 Neurology: 08/21/2024 Urology: 08/28/2024 Ortho Surgery: 08/25/2024  ----------------------------------------------------------------------------------------- -  CC: hospital follow up  HPI:  [name] is a 53 y.o. male with pertinent past medical history of HTN, pre-diabetes, Hyperlipidemia, CVA, and prior cerebral aneurysm with coil placement (further medical history stated below) and presents today for hospital. Please see problem based assessment and plan for additional details.  Last Pertinent Labs Documented:     Latest Ref Rng & Units 07/17/2024    5:47 AM 07/16/2024    5:07  AM 07/15/2024    6:03 AM  BMP  Glucose 70 - 99 mg/dL 898  885  894   BUN 6 - 20 mg/dL 24  23  19    Creatinine 0.61 - 1.24 mg/dL 8.59  8.62  8.72   Sodium 135 - 145 mmol/L 138  140  142   Potassium 3.5 - 5.1 mmol/L 4.1  4.3  4.1   Chloride 98 - 111 mmol/L 103  102  103   CO2 22 - 32 mmol/L 26  28  27    Calcium  8.9 - 10.3 mg/dL 9.4  9.8  9.8        Latest Ref Rng & Units 07/17/2024    5:47 AM 07/16/2024    5:07 AM 07/15/2024    6:03 AM  CBC  WBC 4.0 - 10.5 K/uL 7.3  5.8  5.1   Hemoglobin 13.0 - 17.0 g/dL 88.7  88.5  89.1   Hematocrit 39.0 - 52.0 % 33.3  34.3  32.8   Platelets 150 - 400 K/uL 352  322  319     Lab Results  Component Value Date   HGBA1C 5.8 (H) 07/13/2024   HGBA1C 6.3 (H) 08/18/2018     Past Medical History:  Diagnosis Date   Aneurysm (HCC)    Cardiomyopathy (HCC)    pt sts he no longer has it   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 30 tablet 0  albuterol  (VENTOLIN  HFA) 108 (90 Base) MCG/ACT inhaler Inhale 2 puffs into the lungs every 6 (six) hours as needed for wheezing or shortness of breath. 8 g 2   aspirin  EC 81 MG tablet Take 1 tablet (81 mg total) by mouth daily. Swallow whole. 30 tablet 1   atorvastatin  (LIPITOR) 80 MG tablet Take 1 tablet (80 mg total) by mouth at bedtime. 30 tablet 1   [Paused] buPROPion (WELLBUTRIN SR) 150 MG 12 hr tablet Take 150 mg by mouth 2 (two) times daily as needed (anxiety, relaxation).     [Paused] chlorthalidone (HYGROTON) 25 MG tablet Take 25 mg by mouth daily.     clopidogrel  (PLAVIX ) 75 MG tablet Take 1 tablet (75 mg total) by mouth daily. 19 tablet 0   levofloxacin  (LEVAQUIN ) 750 MG tablet Take 1 tablet (750 mg total) by mouth daily. 3 tablet 0    [Paused] losartan (COZAAR) 50 MG tablet Take 50 mg by mouth daily.     pregabalin (LYRICA) 75 MG capsule Take 75-150 mg by mouth daily as needed (pain).     SPIRIVA HANDIHALER 18 MCG inhalation capsule Place 18 mcg into inhaler and inhale daily as needed (wheezing).     SYMBICORT  160-4.5 MCG/ACT inhaler Inhale 2 puffs into the lungs in the morning and at bedtime.     [Paused] VIAGRA 100 MG tablet Take 100 mg by mouth as needed for erectile dysfunction.     No current facility-administered medications on file prior to visit.    Family History  Problem Relation Age of Onset   Lung cancer Father    Throat cancer Father     Social History   Socioeconomic History   Marital status: Single    Spouse name: Not on file   Number of children: Not on file   Years of education: Not on file   Highest education level: Not on file  Occupational History   Not on file  Tobacco Use   Smoking status: Former    Types: Cigarettes   Smokeless tobacco: Never  Vaping Use   Vaping status: Some Days  Substance and Sexual Activity   Alcohol use: Not on file   Drug use: Not Currently    Types: Marijuana   Sexual activity: Not on file  Other Topics Concern   Not on file  Social History Narrative   Not on file   Social Drivers of Health   Financial Resource Strain: Low Risk  (07/24/2024)   Overall Financial Resource Strain (CARDIA)    Difficulty of Paying Living Expenses: Not very hard  Food Insecurity: No Food Insecurity (07/12/2024)   Hunger Vital Sign    Worried About Running Out of Food in the Last Year: Never true    Ran Out of Food in the Last Year: Never true  Transportation Needs: No Transportation Needs (07/12/2024)   PRAPARE - Administrator, Civil Service (Medical): No    Lack of Transportation (Non-Medical): No  Physical Activity: Not on file  Stress: No Stress Concern Present (07/24/2024)   [employer] of Occupational Health - Occupational Stress Questionnaire     Feeling of Stress: Not at all  Social Connections: Unknown (07/12/2024)   Social Connection and Isolation Panel    Frequency of Communication with Friends and Family: More than three times a week    Frequency of Social Gatherings with Friends and Family: Not on file    Attends Religious Services: Not on file    Active Member of  Clubs or Organizations: Not on file    Attends Club or Organization Meetings: Not on file    Marital Status: Not on file  Intimate Partner Violence: Not At Risk (07/12/2024)   Humiliation, Afraid, Rape, and Kick questionnaire    Fear of Current or Ex-Partner: No    Emotionally Abused: No    Physically Abused: No    Sexually Abused: No    Review of Systems: Review of Systems  Constitutional:  Negative for chills, fever and malaise/fatigue.  Eyes:  Negative for blurred vision and double vision.  Respiratory:  Negative for cough and shortness of breath.   Cardiovascular:  Negative for chest pain, palpitations and leg swelling.  Gastrointestinal:  Positive for constipation. Negative for abdominal pain, blood in stool, diarrhea, heartburn, melena, nausea and vomiting.  Genitourinary:  Negative for dysuria, flank pain, frequency and hematuria.  Musculoskeletal:  Negative for myalgias.       Left ankle swelling and pain  Neurological:  Negative for dizziness, tingling, sensory change, speech change, focal weakness and headaches.  Endo/Heme/Allergies:  Bruises/bleeds easily.     Vitals:   07/24/24 1520  BP: 134/83  Pulse: 67  Temp: 98.7 F (37.1 C)  TempSrc: Oral  SpO2: 94%  Weight: 253 lb (114.8 kg)  Height: 6' 3 (1.905 m)    Physical Exam: Physical Exam Cardiovascular:     Rate and Rhythm: Normal rate and regular rhythm.     Heart sounds: Normal heart sounds. No murmur heard.    No friction rub. No gallop.  Pulmonary:     Effort: Pulmonary effort is normal.     Breath sounds: No stridor. No wheezing, rhonchi or rales.  Abdominal:     General:  Bowel sounds are normal.     Palpations: Abdomen is soft.     Tenderness: There is abdominal tenderness. There is no right CVA tenderness, left CVA tenderness, guarding or rebound.  Musculoskeletal:        General: Tenderness (left achilles. tenderness to dorsiflexion) present.     Right lower leg: No edema.     Left lower leg: No edema.  Skin:    General: Skin is warm and dry.  Neurological:     Mental Status: He is alert.      Assessment & Plan:   Patient discussed with Dr. Trudy RIDES EARLY MID AFTERNOON APPOINTMENTS WORK BEST FOR PATIENT Assessment & Plan Sepsis with acute renal failure without septic shock, due to unspecified organism, unspecified acute renal failure type [facility]) Discharged with Levofloxaxin 750 mg for 7 days. Patient denies Fevers, Chills, patient is normotensive. No complaints.  -finished course, no current complaints.  Cerebrovascular accident (CVA), unspecified mechanism (HCC) MR brain in hospital: scattered foci of restricted diffusion within the left cerebral hemisphere, including precentral and postcentral gyri and occipital lobe compatible with acute embolic nonhemorrhagic infarcts. Cystic Lesion within right parotid measuring 2 cm in diameter, stable.  CT brain in hospital: no large vessel occlusion, hemodynamically significant stenosis, or aneurysm, mild stenosis of Left P1 and P2 segments; Bubble study: negative; TEE: Negative   -continue ASA 81 mg indefinitely  -continue Clopidogrel  75 mg until completed 21 days -f/u with neurology 08/21/2024 Nephrolithiasis CT Renal Stone Study: nonobstructive bilaterally renal calculi measuring up to 10 mm on the right. No ureteral calculi. No hydronephrosis. No current complaints, dysuria, flank pain, or hematuria.  -f/u with urology 08/28/2024 Constipation, unspecified constipation type Patient reports no bowel movement since Friday, and before hand had not had  a bowel movement since discharge. Patient reports no  abdominal pain or blood in stool. Patient has been taking Miralax  with minimal relief.  -patient instructed to take senna OTC  Tendonitis, Achilles, left Patient reports since leaving hospital some increased swelling and tenderness on left achilles. Tender to Dorsiflexion and palpation along achilles.  -instructed patient to get Voltaren Gel OTC  -Instructed patient to take tylenol  PRN for breakthrough pain Essential hypertension AKI (acute kidney injury) [facility]) Patient currently holding all HTN medications. In setting of AKI and continued stable BP 134/83 -continue holding BP medications -BMP ordered -instructed patient to avoid NSAIDs Prediabetes Lab Results  Component Value Date   HGBA1C 5.8 (H) 07/13/2024   HGBA1C 6.3 (H) 08/18/2018  -F/u in 1 month for pre-diabetes counseling  -Patient informed of A1C and instructed to implement lifestyle changes such as diabetic friendly diet.  Hyperlipidemia, unspecified hyperlipidemia type Lipid Panel     Component Value Date/Time   CHOL 196 07/15/2024 0603   TRIG 129 07/15/2024 0603   HDL 42 07/15/2024 0603   CHOLHDL 4.7 07/15/2024 0603   VLDL 26 07/15/2024 0603   LDLCALC 128 (H) 07/15/2024 0603  -continue Atorvastatin  80 mg  Orders Placed This Encounter  Procedures   Basic metabolic panel with GFR     Gracielynn Birkel, D.O. [facility], PGY-1 Date 07/24/2024 Time 6:40 PM

## 2024-07-24 NOTE — Assessment & Plan Note (Addendum)
 Lipid Panel     Component Value Date/Time   CHOL 196 07/15/2024 0603   TRIG 129 07/15/2024 0603   HDL 42 07/15/2024 0603   CHOLHDL 4.7 07/15/2024 0603   VLDL 26 07/15/2024 0603   LDLCALC 128 (H) 07/15/2024 0603  -continue Atorvastatin  80 mg

## 2024-07-24 NOTE — Assessment & Plan Note (Addendum)
 Lab Results  Component Value Date   HGBA1C 5.8 (H) 07/13/2024   HGBA1C 6.3 (H) 08/18/2018  -F/u in 1 month for pre-diabetes counseling  -Patient informed of A1C and instructed to implement lifestyle changes such as diabetic friendly diet.

## 2024-07-24 NOTE — Assessment & Plan Note (Addendum)
 Discharged with Levofloxaxin 750 mg for 7 days. Patient denies Fevers, Chills, patient is normotensive. No complaints.  -finished course, no current complaints.

## 2024-07-24 NOTE — Assessment & Plan Note (Signed)
 Patient reports since leaving hospital some increased swelling and tenderness on left achilles. Tender to Dorsiflexion and palpation along achilles.  -instructed patient to get Voltaren Gel OTC  -Instructed patient to take tylenol  PRN for breakthrough pain

## 2024-07-24 NOTE — Assessment & Plan Note (Signed)
 Patient reports no bowel movement since Friday, and before hand had not had a bowel movement since discharge. Patient reports no abdominal pain or blood in stool. Patient has been taking Miralax  with minimal relief.  -patient instructed to take senna OTC

## 2024-07-24 NOTE — Patient Instructions (Addendum)
 Today we discussed the following medical conditions and plan:   Voltaren Gel for left achilles Senna for constipation  BMP (to check kidney numbers) - will call/message about results.  When you come back in 1 month for A1C and Hypertension.  We look forward to seeing you next time. Please call our clinic at [phone] if you have any questions or concerns. The best time to call is Monday-Friday from 9am-4pm, but there is someone available 24/7. If you need medication refills, please notify your pharmacy one week in advance and they will send us  a request.   Thank you for trusting me with your care. Wishing you the best!   Sallyanne Primas, DO  [facility]

## 2024-07-25 ENCOUNTER — Ambulatory Visit: Payer: Self-pay

## 2024-07-25 LAB — BASIC METABOLIC PANEL WITH GFR
BUN/Creatinine Ratio: 22 — ABNORMAL HIGH (ref 9–20)
BUN: 24 mg/dL (ref 6–24)
CO2: 23 mmol/L (ref 20–29)
Calcium: 9.4 mg/dL (ref 8.7–10.2)
Chloride: 105 mmol/L (ref 96–106)
Creatinine, Ser: 1.08 mg/dL (ref 0.76–1.27)
Glucose: 96 mg/dL (ref 70–99)
Potassium: 4.2 mmol/L (ref 3.5–5.2)
Sodium: 142 mmol/L (ref 134–144)
eGFR: 82 mL/min/1.73 (ref >59)

## 2024-07-26 LAB — ECHO TEE
AV Mean grad: 3 mmHg
AV Peak grad: 5.6 mmHg
Ao pk vel: 1.18 m/s

## 2024-07-27 ENCOUNTER — Telehealth: Payer: Self-pay

## 2024-07-27 NOTE — Telephone Encounter (Signed)
 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 Stated he's ready to go to work and wants to start on Monday. Thanks

## 2024-07-27 NOTE — Telephone Encounter (Signed)
 Message has been forwarded to Dr. Benuel and the nurse.   Copied from CRM #[id number]. Topic: General - Other >> Jul 27, 2024  1:59 PM Carrielelia G wrote: Please call pt Roy [name], regarding his letter (in chart 8/5) to his job please edit it to allow patient to drive locally 1/81/74  to 08/07/24  then drive fully starting at 1/74/74.

## 2024-07-28 ENCOUNTER — Ambulatory Visit: Payer: Self-pay

## 2024-07-28 NOTE — Telephone Encounter (Signed)
 I spoke with Roy [name] on the phone 07/28/2024 at 4:15 PM about the results of his transesophageal echo. He demonstrated understanding of his grossly normal TEE with negative bubble study.  I explained the findings of trivial valvular regurg and small pericardial effusion.  Roy [name] has appointment set up with cardiology in the future.  He had no other questions at this time.  Sallyanne Primas, DO [facility]

## 2024-07-31 NOTE — Progress Notes (Signed)
Internal Medicine Clinic Attending  I was physically present during the key portions of the resident provided service and participated in the medical decision making of patient's management care. I reviewed pertinent patient test results.  The assessment, diagnosis, and plan were formulated together and I agree with the documentation in the resident's note.  Williams, Julie Anne, MD  

## 2024-08-03 ENCOUNTER — Telehealth: Payer: Self-pay

## 2024-08-03 ENCOUNTER — Ambulatory Visit: Payer: Self-pay

## 2024-08-03 NOTE — Telephone Encounter (Addendum)
 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 He states he will try to upload a picture of the swelling. He states he is not sure if the swelling is worse unilaterally and he would like to upload a picture with it for RN to assess. Advised patient to go to ED if he develops any chest pain or SOB.  Called patient patient back regarding his symptoms. He was able to upload photos to mychart. Legs appear bilaterally swollen. He states he does have a red mark on the sole of his right foot. He states he is no longer driving and his thigh numbness has resolved.   Answer Assessment - Initial Assessment Questions Patient states he had also sent a message in mychart requesting pain medication and anxiety medication & included pictures yesterday from his swelling.  1. ONSET: When did the swelling start? (e.g., minutes, hours, days)     Yesterday.  2. LOCATION: What part of the leg is swollen?  Are both legs swollen or just one leg?     Lower leg (shins), ankles. Both legs. He states this morning his right was more swollen than his left.  3. SEVERITY: How bad is the swelling? (e.g., localized; mild, moderate, severe)     He states if he wears any length socks, the elastic is cutting in and there is puffiness above the sock.  4. REDNESS: Is there redness or signs of infection?     No.  5. PAIN: Is the swelling painful to touch? If Yes, ask: How painful is it?   (Scale 1-10; mild, moderate or severe)     No.  6. FEVER: Do you have a fever? If Yes, ask: What is it, how was it measured, and when did it start?      No.  7. CAUSE: What do you think is causing the leg swelling?     He states he thinks it is related to his blood thinner. 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  8. MEDICAL HISTORY: Do you have a history of blood clots (e.g., DVT), cancer, heart failure, kidney disease, or liver failure?     CVA.  9. RECURRENT  SYMPTOM: Have you had leg swelling before? If Yes, ask: When was the last time? What happened that time?     No.  10. OTHER SYMPTOMS: Do you have any other symptoms? (e.g., chest pain, difficulty breathing)       Easily bruising; right thigh numbness since driving this afternoon; anxiety; left Achilles tendon tender/sore and worse when walking. Denies chest pain or SOB.  11. PREGNANCY: Is there any chance you are pregnant? When was your last menstrual period?       N/A.  Protocols used: Leg Swelling and Edema-A-AH

## 2024-08-03 NOTE — Telephone Encounter (Signed)
 FYI Only or Action Required?: Action required by provider: update on patient condition.  Patient was last seen in primary care on 07/24/2024 by Benuel Braun, DO.  Called Nurse Triage reporting Leg Swelling.  Symptoms began yesterday.  Interventions attempted: Prescription medications: Plavix .  Symptoms are: bilateral lower leg/ankle/tops of feet swelling, left achilles sore/pain when walking, anxiety, right thigh numbness(resolved, worse when sitting and driving), small red/purple bruising generalized stable.  Triage Disposition: See PCP When Office is Open (Within 3 Days)- only available appt with [facility] is 3:45pm on Monday, patient states he can only come in on Monday morning for an appt and states he feels better and doesn't think he needs to come in  Patient/caregiver understands and will follow disposition?: No, wishes to speak with PCP                     Reason for Disposition  [1] MILD swelling of both ankles (i.e., pedal edema) AND [2] new-onset or getting worse  Protocols used: Leg Swelling and Edema-A-AH

## 2024-08-03 NOTE — Telephone Encounter (Signed)
 Spoke with Roy [name] on the phone 08/03/2024 around 6:15pm regarding achilles tendonitis and health anxiety. Below is the summary message I sent in Mychart regarding the matter.  We talked about for your achilles tendonitis- it unfortunately wont get better overnight, but rest, ice, and elevation is a great treatment. You can also take tylenol  as needed for moderate pain. You can also do some small stretching exercises. If you continue to feel pain, talk to Dr. Charmayne about referring you to Physical therapy.  If you feel a pain that is severe or sharp that feels like a tear, that would be our most concerning symptoms and you should call the clinic about that. Swelling along the heel and calf is expected.   Your swelling while you drive, you can start driving with compression socks and that may help. Taking breaks to walk around a bit can also be beneficial, although difficult I'm sure in your line of work.   We also talked about your health anxiety. Unfortunately there are no great as needed medications without sedating effects. You can look into SSRIs or SNRIs (like escitalopram and paroxetine) which are daily depression/anxiety/panic disorder medications that might work well for you. I would above all consider counseling to help with relaxation methods / cognitive-behavioral therapy techniques.   Best,  Sallyanne Primas, DO [facility]

## 2024-08-07 NOTE — Telephone Encounter (Signed)
 RTC to patient states his swelling has gone down and his legs are doing  better.  Reminded of his appointment on 08/21/2024.  Will call if problem returns.

## 2024-08-16 ENCOUNTER — Ambulatory Visit: Payer: Self-pay

## 2024-08-16 NOTE — Telephone Encounter (Signed)
 FYI Only or Action Required?: FYI only for provider.  Patient was last seen in primary care on 07/24/2024 by Benuel Braun, DO.  Called Nurse Triage reporting Adenopathy.  Symptoms began Sunday.  Interventions attempted: Nothing.  Symptoms are: stable.  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 Topic: Clinical - Red Word Triage >> Aug 16, 2024 10:19 AM Susanna ORN wrote: Red Word that prompted transfer to Nurse Triage: Patient calling in stating that he has a knot under his ear, right along his jaw line. States it's kind of hard and not movable and also says it just came up. He states it's not hurting or anything but he just wants to speak with a nurse about it. Reason for Disposition  [1] Single large node AND [2] size > 1 inch (2.5 cm) AND [3] no fever  Answer Assessment - Initial Assessment Questions 1. LOCATION: Where is the swollen node located? Is the matching node on the other side of the body also swollen?      On jaw line under ear lobe right side- hard 2. SIZE: How big is the node? (e.g., inches or centimeters; or compared to common objects such as pea, bean, marble, golf ball)      Large marble 2 inches  3. ONSET: When did the swelling start?      Sunday  4. NECK NODES: Is there a sore throat, runny nose or other symptoms of a cold?      no 5. GROIN OR ARMPIT NODES: Is there a sore, scratch, cut or painful red area on that arm or leg?      no 6. FEVER: Do you have a fever? If Yes, ask: What is it, how was it measured, and when did it start?      no 7. CAUSE: What do you think is causing the swollen lymph nodes?     unsure 8. OTHER SYMPTOMS: Do you have any other symptoms? (e.g., node is tender to touch, skin redness over node, weight changes)      no  Protocols used: Lymph Nodes - Swollen-A-AH

## 2024-08-17 ENCOUNTER — Ambulatory Visit

## 2024-08-17 DIAGNOSIS — I639 Cerebral infarction, unspecified: Secondary | ICD-10-CM | POA: Diagnosis not present

## 2024-08-17 NOTE — Progress Notes (Signed)
 [facility] [address] [place]. [place] [address] [phone]       HOSPITAL FOLLOW UP NOTE  [name] Date of Birth:  08-09-1971 Medical Record Number:  [id number]   Reason for Referral:  hospital stroke follow up    SUBJECTIVE:   CHIEF COMPLAINT:  Chief Complaint  Patient presents with   Cerebrovascular Accident    Rm 3 alone Pt is well and stable, reports no residual stroke concerns. He is at baseline.     HPI:   Mr. Roy L [name] is a 53 y.o. male with history of hypertension, hyperlipidemia, CAD, smoker, left P-comm aneurysm status post coil 2021 who presented to [facility] ED on 07/12/2024 with chills, malaise, urinary frequency, right arm weakness and numbness.  MRI brain showed left MCA scattered infarcts secondary to unclear source.  CTA head/neck showed mild left P1 and P2 stenosis.  EF 60 to 65%.  LE Doppler negative for DVT.  TEE no evidence of PFO.  Loop recorder placed.  LDL 128.  A1c 5.8.  UDS positive for THC.  Recommended DAPT for 3 weeks and aspirin  alone and increased atorvastatin  to 80 mg daily.  Tobacco and THC cessation counseling provided.  He was also treated for AKI, UTI and sepsis by primary team.  Antihypertensive regimen adjusted due to episode of hypotension.  On phentermine PTA and discontinued due to increased stroke risk.  Gradually recovered and discharged home without therapy needs.   Today, 08/21/2024, patient is being seen for hospital follow-up unaccompanied.  Reports doing well since discharge without new stroke/TIA symptoms and denies residual stroke deficits.  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  Completed 3 weeks DAPT, remains on aspirin  alone as well as atorvastatin  without side effects.  Reports elevation of blood pressure more recently, slightly elevated initially but improved on recheck.  He does have follow-up visit with PCP this morning and  plans on further discussing need of restarting prior antihypertensives.  Loop recorder with first download yesterday without evidence of A-fib.  Does report weight loss which changes in dietary regimen.  He completely quit smoking cigarettes and currently vaping but does plan on stopping vaping eventually as well.  No further neurological questions or concerns at this time.       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   Hypertension    Renal calculi    Tobacco abuse     PSH:  Past Surgical History:  Procedure Laterality Date   ANEURYSM COILING     a few years ago   DECORTICATION Right 09/02/2018   Procedure: DECORTICATION;  Surgeon: Kerrin Elspeth BROCKS, MD;  Location: [facility] OR;  Service: Thoracic;  Laterality: Right;   LOOP RECORDER INSERTION N/A 07/17/2024   Procedure: LOOP RECORDER INSERTION;  Surgeon: Lesia Ozell Barter, PA-C;  Location: [facility] INVASIVE  CV LAB;  Service: Cardiovascular;  Laterality: N/A;   PLEURAL EFFUSION DRAINAGE Right 09/02/2018   Procedure: DRAINAGE OF PLEURAL EFFUSION;  Surgeon: Kerrin Elspeth BROCKS, MD;  Location: [facility] OR;  Service: Thoracic;  Laterality: Right;   TRANSESOPHAGEAL ECHOCARDIOGRAM (CATH LAB) N/A 07/17/2024   Procedure: TRANSESOPHAGEAL ECHOCARDIOGRAM;  Surgeon: Lonni Slain, MD;  Location: [facility] INVASIVE CV LAB;  Service: Cardiovascular;  Laterality: N/A;   VIDEO ASSISTED THORACOSCOPY (VATS)/THOROCOTOMY Right 09/02/2018    Procedure: VIDEO ASSISTED THORACOSCOPY (VATS)/possible THOROCOTOMY;  Surgeon: Kerrin Elspeth BROCKS, MD;  Location: [facility] OR;  Service: Thoracic;  Laterality: Right;    Social History:  Social History   Socioeconomic History   Marital status: Single    Spouse name: Not on file   Number of children: Not on file   Years of education: Not on file   Highest education level: Not on file  Occupational History   Not on file  Tobacco Use   Smoking status: Former    Types: Cigarettes   Smokeless tobacco: Never  Vaping Use   Vaping status: Some Days  Substance and Sexual Activity   Alcohol use: Not on file   Drug use: Not Currently    Types: Marijuana   Sexual activity: Not on file  Other Topics Concern   Not on file  Social History Narrative   Not on file   Social Drivers of Health   Financial Resource Strain: Low Risk  (07/24/2024)   Overall Financial Resource Strain (CARDIA)    Difficulty of Paying Living Expenses: Not very hard  Food Insecurity: No Food Insecurity (07/12/2024)   Hunger Vital Sign    Worried About Running Out of Food in the Last Year: Never true    Ran Out of Food in the Last Year: Never true  Transportation Needs: No Transportation Needs (07/12/2024)   PRAPARE - Administrator, Civil Service (Medical): No    Lack of Transportation (Non-Medical): No  Physical Activity: Not on file  Stress: No Stress Concern Present (07/24/2024)   [employer] of Occupational Health - Occupational Stress Questionnaire    Feeling of Stress: Not at all  Social Connections: Unknown (07/12/2024)   Social Connection and Isolation Panel    Frequency of Communication with Friends and Family: More than three times a week    Frequency of Social Gatherings with Friends and Family: Not on file    Attends Religious Services: Not on file    Active Member of Clubs or Organizations: Not on file    Attends Banker Meetings: Not on file    Marital Status: Not on file   Intimate Partner Violence: Not At Risk (07/12/2024)   Humiliation, Afraid, Rape, and Kick questionnaire    Fear of Current or Ex-Partner: No    Emotionally Abused: No    Physically Abused: No    Sexually Abused: No    Family History:  Family History  Problem Relation Age of Onset   Lung cancer Father    Throat cancer Father     Medications:   Current Outpatient Medications on File Prior to Visit  Medication Sig Dispense Refill   acetaminophen  (TYLENOL ) 500 MG tablet Take 2 tablets (1,000 mg total) by mouth every 6 (six) hours as needed for mild pain or fever. 30 tablet 0   albuterol  (VENTOLIN  HFA) 108 (90 Base) MCG/ACT inhaler Inhale 2 puffs into the lungs every 6 (six) hours as needed for wheezing or shortness of breath. 8 g 2  aspirin  EC 81 MG tablet Take 1 tablet (81 mg total) by mouth daily. Swallow whole. 30 tablet 1   atorvastatin  (LIPITOR) 80 MG tablet Take 1 tablet (80 mg total) by mouth at bedtime. 30 tablet 1   [Paused] buPROPion (WELLBUTRIN SR) 150 MG 12 hr tablet Take 150 mg by mouth 2 (two) times daily as needed (anxiety, relaxation).     [Paused] chlorthalidone (HYGROTON) 25 MG tablet Take 25 mg by mouth daily.     levofloxacin  (LEVAQUIN ) 750 MG tablet Take 1 tablet (750 mg total) by mouth daily. 3 tablet 0   [Paused] losartan (COZAAR) 50 MG tablet Take 50 mg by mouth daily.     pregabalin (LYRICA) 75 MG capsule Take 75-150 mg by mouth daily as needed (pain).     SPIRIVA HANDIHALER 18 MCG inhalation capsule Place 18 mcg into inhaler and inhale daily as needed (wheezing).     SYMBICORT  160-4.5 MCG/ACT inhaler Inhale 2 puffs into the lungs in the morning and at bedtime.     [Paused] VIAGRA 100 MG tablet Take 100 mg by mouth as needed for erectile dysfunction.     No current facility-administered medications on file prior to visit.    Allergies:  No Known Allergies    OBJECTIVE:  Physical Exam  Vitals:   08/21/24 0910 08/21/24 0931  BP: (!) 159/94 132/78   Pulse: 63   Weight: 253 lb (114.8 kg)   Height: 6' 3 (1.905 m)    Body mass index is 31.62 kg/m. No results found.   General: well developed, well nourished, very pleasant middle-age African-American male, seated, in no evident distress  Neurologic Exam Mental Status: Awake and fully alert.  Fluent speech and language.  Oriented to place and time. Recent and remote memory intact. Attention span, concentration and fund of knowledge appropriate. Mood and affect appropriate.  Cranial Nerves: Pupils equal, briskly reactive to light. Extraocular movements full without nystagmus. Visual fields full to confrontation. Hearing intact. Facial sensation intact. Face, tongue, palate moves normally and symmetrically.  Motor: Normal bulk and tone. Normal strength in all tested extremity muscles Sensory.: intact to touch , pinprick , position and vibratory sensation.  Coordination: Rapid alternating movements normal in all extremities. Finger-to-nose and heel-to-shin performed accurately bilaterally. Gait and Station: Arises from chair without difficulty. Stance is normal. Gait demonstrates normal stride length and balance without use of AD. Reflexes: 1+ and symmetric. Toes downgoing.     NIHSS  0 Modified Rankin  0      ASSESSMENT: Whyatt L [name] is a 53 y.o. year old male with left MCA scattered infarcts on 07/12/2024 secondary to unclear source s/p ILR. Vascular risk factors include HTN, HLD, tobacco use, THC use, CAD, and hx of left P-comm aneurysm s/p coiling 2021.      PLAN:  Left MCA scattered infarcts:  Recovered well without residual deficits Continue aspirin  81mg  daily and atorvastatin  (Lipitor) for secondary stroke prevention managed/prescribed by PCP.   Loop recorder continue to be monitored by cardiology Discussed secondary stroke prevention measures and importance of close PCP follow up for aggressive stroke risk factor management including BP goal<130/90, HLD with LDL  goal<70 and DM with A1c.<7 .  Stroke labs 06/2024: LDL 128, A1c 5.8 I have gone over the pathophysiology of stroke, warning signs and symptoms, risk factors and their management in some detail with instructions to go to the closest emergency room for symptoms of concern.  Tobacco use: Congratulated on completely discontinuing cigarettes and discussed importance of  discontinuing vaping as well.  He verbalized understanding and plans on trying to quit vaping in the near future.  He was encouraged to follow-up with PCP if assistance is needed.     No further recommendations from neurological standpoint and is closely being followed by PCP.  He can follow-up on an as-needed basis at this time but advised to call with any questions or concerns in the future.   CC:  [facility] provider: Dr. Rosemarie PCP: Benuel Braun, DO    I personally spent a total of 55 minutes in the care of the patient today including preparing to see the patient, getting/reviewing separately obtained history, performing a medically appropriate exam/evaluation, placing orders, and documenting clinical information in the EHR.  Harlene Bogaert, AGNP-BC  [facility] [address] [place], [place] [address]  Phone [phone] Fax [phone] Note: This document was prepared with digital dictation and possible smart phrase technology. Any transcriptional errors that result from this process are unintentional.

## 2024-08-18 LAB — CUP PACEART REMOTE DEVICE CHECK
Date Time Interrogation Session: 20250904154551
Implantable Pulse Generator Implant Date: 20250804

## 2024-08-20 ENCOUNTER — Ambulatory Visit: Payer: Self-pay | Admitting: Cardiology

## 2024-08-21 ENCOUNTER — Ambulatory Visit: Admitting: Student

## 2024-08-21 ENCOUNTER — Ambulatory Visit (INDEPENDENT_AMBULATORY_CARE_PROVIDER_SITE_OTHER): Admitting: Adult Health

## 2024-08-21 ENCOUNTER — Encounter

## 2024-08-21 ENCOUNTER — Encounter: Payer: Self-pay | Admitting: Adult Health

## 2024-08-21 ENCOUNTER — Other Ambulatory Visit: Payer: Self-pay

## 2024-08-21 ENCOUNTER — Encounter: Payer: Self-pay | Admitting: Student

## 2024-08-21 VITALS — BP 134/75 | HR 57 | Temp 98.3°F | Ht 75.0 in | Wt 253.6 lb

## 2024-08-21 VITALS — BP 132/78 | HR 63 | Ht 75.0 in | Wt 253.0 lb

## 2024-08-21 DIAGNOSIS — I10 Essential (primary) hypertension: Secondary | ICD-10-CM | POA: Diagnosis not present

## 2024-08-21 DIAGNOSIS — R220 Localized swelling, mass and lump, head: Secondary | ICD-10-CM

## 2024-08-21 DIAGNOSIS — R7303 Prediabetes: Secondary | ICD-10-CM

## 2024-08-21 DIAGNOSIS — I639 Cerebral infarction, unspecified: Secondary | ICD-10-CM

## 2024-08-21 DIAGNOSIS — Z09 Encounter for follow-up examination after completed treatment for conditions other than malignant neoplasm: Secondary | ICD-10-CM | POA: Diagnosis not present

## 2024-08-21 DIAGNOSIS — K1121 Acute sialoadenitis: Secondary | ICD-10-CM

## 2024-08-21 NOTE — Patient Instructions (Addendum)
 Continue aspirin  81 mg daily  and atorvastatin  for secondary stroke prevention - refills can be obtained by your PCP  Highly recommend routine monitoring of blood pressure at home and to follow-up with your PCP if this remains elevated  Highly encourage complete nicotine  cessation as continued use greatly increases risk of recurrent strokes  Continue to follow up with PCP regarding blood pressure and cholesterol management  Maintain strict control of hypertension with blood pressure goal below 130/90 and cholesterol with LDL cholesterol (bad cholesterol) goal below 70 mg/dL.   Signs of a Stroke? Follow the BEFAST method:  Balance Watch for a sudden loss of balance, trouble with coordination or vertigo Eyes Is there a sudden loss of vision in one or both eyes? Or double vision?  Face: Ask the person to smile. Does one side of the face droop or is it numb?  Arms: Ask the person to raise both arms. Does one arm drift downward? Is there weakness or numbness of a leg? Speech: Ask the person to repeat a simple phrase. Does the speech sound slurred/strange? Is the person confused ? Time: If you observe any of these signs, call 911.        Thank you for coming to see us  at [facility]. I hope we have been able to provide you high quality care today.  You may receive a patient satisfaction survey over the next few weeks. We would appreciate your feedback and comments so that we may continue to improve ourselves and the health of our patients.    Stroke Prevention Some medical conditions and lifestyle choices can lead to a higher risk for a stroke. You can help to prevent a stroke by eating healthy foods and exercising. It also helps to not smoke and to manage any health problems you may have. How can this condition affect me? A stroke is an emergency. It should be treated right away. A stroke can lead to brain damage or threaten your life. There is a better chance of  surviving and getting better after a stroke if you get medical help right away. What can increase my risk? The following medical conditions may increase your risk of a stroke: Diseases of the heart and blood vessels (cardiovascular disease). High blood pressure (hypertension). Diabetes. High cholesterol. Sickle cell disease. Problems with blood clotting. Being very overweight. Sleeping problems (obstructivesleep apnea). Other risk factors include: Being older than age 16. A history of blood clots, stroke, or mini-stroke (TIA). Race, ethnic background, or a family history of stroke. Smoking or using tobacco products. Taking birth control pills, especially if you smoke. Heavy alcohol and drug use. Not being active. What actions can I take to prevent this? Manage your health conditions High cholesterol. Eat a healthy diet. If this is not enough to manage your cholesterol, you may need to take medicines. Take medicines as told by your doctor. High blood pressure. Try to keep your blood pressure below 130/80. If your blood pressure cannot be managed through a healthy diet and regular exercise, you may need to take medicines. Take medicines as told by your doctor. Ask your doctor if you should check your blood pressure at home. Have your blood pressure checked every year. Diabetes. Eat a healthy diet and get regular exercise. If your blood sugar (glucose) cannot be managed through diet and exercise, you may need to take medicines. Take medicines as told by your doctor. Talk to your doctor about getting checked for sleeping problems. Signs of a  problem can include: Snoring a lot. Feeling very tired. Make sure that you manage any other conditions you have. Nutrition  Follow instructions from your doctor about what to eat or drink. You may be told to: Eat and drink fewer calories each day. Limit how much salt (sodium) you use to 1,500 milligrams (mg) each day. Use only healthy fats  for cooking, such as olive oil, canola oil, and sunflower oil. Eat healthy foods. To do this: Choose foods that are high in fiber. These include whole grains, and fresh fruits and vegetables. Eat at least 5 servings of fruits and vegetables a day. Try to fill one-half of your plate with fruits and vegetables at each meal. Choose low-fat (lean) proteins. These include low-fat cuts of meat, chicken without skin, fish, tofu, beans, and nuts. Eat low-fat dairy products. Avoid foods that: Are high in salt. Have saturated fat. Have trans fat. Have cholesterol. Are processed or pre-made. Count how many carbohydrates you eat and drink each day. Lifestyle If you drink alcohol: Limit how much you have to: 0-1 drink a day for women who are not pregnant. 0-2 drinks a day for men. Know how much alcohol is in your drink. In the [place] one drink equals one 12 oz bottle of beer ( ), one 5 oz glass of wine ( ), or one 1 oz glass of hard liquor (44mL). Do not smoke or use any products that have nicotine  or tobacco. If you need help quitting, ask your doctor. Avoid secondhand smoke. Do not use drugs. Activity  Try to stay at a healthy weight. Get at least 30 minutes of exercise on most days, such as: Fast walking. Biking. Swimming. Medicines Take over-the-counter and prescription medicines only as told by your doctor. Avoid taking birth control pills. Talk to your doctor about the risks of taking birth control pills if: You are over 9 years old. You smoke. You get very bad headaches. You have had a blood clot. Where to find more information American Stroke Association: www.strokeassociation.org Get help right away if: You or a loved one has any signs of a stroke. BE FAST is an easy way to remember the warning signs: B - Balance. Dizziness, sudden trouble walking, or loss of balance. E - Eyes. Trouble seeing or a change in how you see. F - Face. Sudden weakness or loss of feeling of  the face. The face or eyelid may droop on one side. A - Arms. Weakness or loss of feeling in an arm. This happens all of a sudden and most often on one side of the body. S - Speech. Sudden trouble speaking, slurred speech, or trouble understanding what people say. T - Time. Time to call emergency services. Write down what time symptoms started. You or a loved one has other signs of a stroke, such as: A sudden, very bad headache with no known cause. Feeling like you may vomit (nausea). Vomiting. A seizure. These symptoms may be an emergency. Get help right away. Call your local emergency services (911 in the [place] Do not wait to see if the symptoms will go away. Do not drive yourself to the hospital. Summary You can help to prevent a stroke by eating healthy, exercising, and not smoking. It also helps to manage any health problems you have. Do not smoke or use any products that contain nicotine  or tobacco. Get help right away if you or a loved one has any signs of a stroke. This information is not intended to replace advice  given to you by your health care provider. Make sure you discuss any questions you have with your health care provider. Document Revised: 11/02/2022 Document Reviewed: 11/02/2022 Elsevier Patient Education  2024 [employer].

## 2024-08-21 NOTE — Assessment & Plan Note (Signed)
 Recent A1c 6.3 improved to 5.8.  Getting better, discussed lifestyle.  He has lost some weight on his own.

## 2024-08-21 NOTE — Progress Notes (Signed)
 CC: Right face swelling  HPI:  [name] is a 53 y.o. male with a PMH stated below who presents today for the above issue and general checkup.  Please see problem based assessment and plan for additional details.  Past Medical History:  Diagnosis Date   Aneurysm (HCC)    Cardiomyopathy (HCC)    pt sts he no longer has it   Empyema of pleural space (HCC) 08/2018   Hypertension    Renal calculi    Tobacco abuse     Review of Systems: ROS negative except for what is noted on the assessment and plan.  Vitals:   08/21/24 1051 08/21/24 1058 08/21/24 1129  BP: (!) 153/77 (!) 145/76 134/75  Pulse: 63 (!) 58 (!) 57  Temp: 98.3 F (36.8 C)    TempSrc: Oral    SpO2: 98%    Weight: 253 lb 9.6 oz (115 kg)    Height: 6' 3 (1.905 m)     Physical Exam: Constitutional: well-appearing man in no acute distress HENT: normocephalic atraumatic, mucous membranes moist.  A mobile dime sized mass is noted on the right face, preauricular, painless, no changes to the skin, right ear, inside the mouth. Eyes: conjunctiva non-erythematous Cardiovascular: regular rate and rhythm, no m/r/g Pulmonary/Chest: normal work of breathing on room air, lungs clear to auscultation bilaterally Neurological: alert & oriented x 3, no focal deficit Skin: warm and dry Psych: normal mood and behavior  Assessment & Plan:   Patient discussed with Dr. Shawn  Swelling, mass, or lump on face He would like evaluation of a mass on his right face in the preauricular area.  States that he has noticed swelling for 1 week.  There is no pain or tenderness, fevers, inciting factors.  He feels perfectly healthy.  On examination there is a R-sided, mobile, dime or [place] sized mass in the preauricular area overlying the mandible. There are no skin changes and no tenderness. The R ear canal and drum are normal.  Suspect welling is a lymph node vs focal in the parotid gland. The very quick onset of this and the lack  of systemic symptoms makes something acutely dangerous such as a bacterial infection or cancer unlikely.  It is concerning to [name] and so he thinks about it and feels the mass often.  Differential includes tumor, lymphadenopathy, sialolithiasis, parotitis.  The complete absence of pain makes me doubt that it is a TMJ issue.  Discussed a watch and wait approach versus immediate workup.  - Will pursue ultrasound and CBC today  Essential hypertension Prior was on chlorthalidone 25 and losartan 50 daily.  Held after his recent hospitalization in setting of AKI and stable blood pressures.  In office blood pressure was elevated at first but on third recheck improved to 135/80.  This is similar to prior office visits and a neurology appointment today. - Will continue to monitor blood pressures but continue to hold hypertensive treatment for now -Note a blood pressure goal of less than 130/90 per neurology for his recent stroke.  Systolics are borderline, and so might need resumption of his antihypertensives at future visits  CVA (cerebral vascular accident) [facility]) Was recently hospitalized for stroke.  Now been out for a little over a month.  Today he saw neurology and graduated, no further follow-up with them as planned.  Recommendations are: Continue aspirin  81mg  daily and atorvastatin  (Lipitor) for secondary stroke prevention managed/prescribed by PCP.   Loop recorder continue to be monitored  by cardiology Discussed secondary stroke prevention measures and importance of close PCP follow up for aggressive stroke risk factor management including BP goal<130/90, HLD with LDL goal<70 and DM with A1c.<7  Prediabetes Recent A1c 6.3 improved to 5.8.  Getting better, discussed lifestyle.  He has lost some weight on his own.  RTC in 6 months for general checkup.  Might need blood pressure medicine.  Can consider A1c as he is prediabetic.  Likely will need repeat lipids for goals that are  above.  Lonni Africa, D.O. [facility] Internal Medicine, PGY-1 Phone: [phone] Date 08/21/2024 Time 11:56 AM

## 2024-08-21 NOTE — Progress Notes (Signed)
 Internal Medicine Clinic Attending  Case discussed with the resident at the time of the visit.  We reviewed the resident's history and exam and pertinent patient test results.  I agree with the assessment, diagnosis, and plan of care documented in the resident's note.

## 2024-08-21 NOTE — Assessment & Plan Note (Signed)
 Was recently hospitalized for stroke.  Now been out for a little over a month.  Today he saw neurology and graduated, no further follow-up with them as planned.  Recommendations are: Continue aspirin  81mg  daily and atorvastatin  (Lipitor) for secondary stroke prevention managed/prescribed by PCP.   Loop recorder continue to be monitored by cardiology Discussed secondary stroke prevention measures and importance of close PCP follow up for aggressive stroke risk factor management including BP goal<130/90, HLD with LDL goal<70 and DM with A1c.<7

## 2024-08-21 NOTE — Assessment & Plan Note (Signed)
 He would like evaluation of a mass on his right face in the preauricular area.  States that he has noticed swelling for 1 week.  There is no pain or tenderness, fevers, inciting factors.  He feels perfectly healthy.  On examination there is a R-sided, mobile, dime or Mentos sized mass in the preauricular area overlying the mandible. There are no skin changes and no tenderness. The R ear canal and drum are normal.  Suspect welling is a lymph node vs focal in the parotid gland. The very quick onset of this and the lack of systemic symptoms makes something acutely dangerous such as a bacterial infection or cancer unlikely.  It is concerning to Mr. [place] and so he thinks about it and feels the mass often.  Differential includes tumor, lymphadenopathy, sialolithiasis, parotitis.  The complete absence of pain makes me doubt that it is a TMJ issue.  Discussed a watch and wait approach versus immediate workup.  - Will pursue ultrasound and CBC today

## 2024-08-21 NOTE — Assessment & Plan Note (Addendum)
 Prior was on chlorthalidone 25 and losartan 50 daily.  Held after his recent hospitalization in setting of AKI and stable blood pressures.  In office blood pressure was elevated at first but on third recheck improved to 135/80.  This is similar to prior office visits and a neurology appointment today. - Will continue to monitor blood pressures but continue to hold hypertensive treatment for now -Note a blood pressure goal of less than 130/90 per neurology for his recent stroke.  Systolics are borderline, and so might need resumption of his antihypertensives at future visits

## 2024-08-22 ENCOUNTER — Ambulatory Visit: Payer: Self-pay | Admitting: Student

## 2024-08-22 LAB — CBC WITH DIFFERENTIAL/PLATELET
Basophils Absolute: 0 x10E3/uL (ref 0.0–0.2)
Basos: 0 %
EOS (ABSOLUTE): 0.1 x10E3/uL (ref 0.0–0.4)
Eos: 2 %
Hematocrit: 35.4 % — ABNORMAL LOW (ref 37.5–51.0)
Hemoglobin: 11.7 g/dL — ABNORMAL LOW (ref 13.0–17.7)
Immature Grans (Abs): 0 x10E3/uL (ref 0.0–0.1)
Immature Granulocytes: 0 %
Lymphocytes Absolute: 1.3 x10E3/uL (ref 0.7–3.1)
Lymphs: 22 %
MCH: 34 pg — ABNORMAL HIGH (ref 26.6–33.0)
MCHC: 33.1 g/dL (ref 31.5–35.7)
MCV: 103 fL — ABNORMAL HIGH (ref 79–97)
Monocytes Absolute: 0.8 x10E3/uL (ref 0.1–0.9)
Monocytes: 14 %
Neutrophils Absolute: 3.5 x10E3/uL (ref 1.4–7.0)
Neutrophils: 62 %
Platelets: 328 x10E3/uL (ref 150–450)
RBC: 3.44 x10E6/uL — ABNORMAL LOW (ref 4.14–5.80)
RDW: 13.3 % (ref 11.6–15.4)
WBC: 5.7 x10E3/uL (ref 3.4–10.8)

## 2024-08-24 ENCOUNTER — Telehealth: Payer: Self-pay | Admitting: *Deleted

## 2024-08-24 NOTE — Progress Notes (Signed)
 I agree with the above plan

## 2024-08-24 NOTE — Telephone Encounter (Signed)
 Copied from CRM #[id number]. Topic: Clinical - Lab/Test Results >> Aug 24, 2024 10:59 AM Zane F wrote: Reason for CRM:   Patient called in to discuss in length his recent lab results with a clinical staff member.   Callback Number: [id number]

## 2024-08-26 NOTE — Progress Notes (Signed)
 Remote Loop Recorder Transmission

## 2024-08-28 ENCOUNTER — Ambulatory Visit: Admitting: Urology

## 2024-09-04 ENCOUNTER — Ambulatory Visit
Admission: RE | Admit: 2024-09-04 | Discharge: 2024-09-04 | Disposition: A | Discharge status: Home / Self Care | Source: Ambulatory Visit | Attending: Internal Medicine

## 2024-09-04 DIAGNOSIS — K1121 Acute sialoadenitis: Secondary | ICD-10-CM

## 2024-09-08 ENCOUNTER — Encounter (HOSPITAL_COMMUNITY): Payer: Self-pay

## 2024-09-08 ENCOUNTER — Other Ambulatory Visit (INDEPENDENT_AMBULATORY_CARE_PROVIDER_SITE_OTHER): Payer: Self-pay | Admitting: Otolaryngology

## 2024-09-08 ENCOUNTER — Telehealth (INDEPENDENT_AMBULATORY_CARE_PROVIDER_SITE_OTHER): Payer: Self-pay

## 2024-09-08 DIAGNOSIS — K118 Other diseases of salivary glands: Secondary | ICD-10-CM

## 2024-09-08 NOTE — Telephone Encounter (Signed)
Left SMS message

## 2024-09-08 NOTE — Progress Notes (Signed)
 Roy [name] POUR, MD  [name] Complex right parotid cyst.  Approved for US  aspiration, send for cytology.  Ne sedation.  HKM       Previous Messages    ----- Message ----- From: [name] Sent: 09/08/2024  10:19 AM EDT To: [name]; [name] Subject: US  FNA BIOPSY SALIVARY GLAND PAROTID GLAND E*  Procedure : US  FNA BIOPSY SALIVARY GLAND PAROTID GLAND EA ADDT'L LESION  Reason : parotid mass Dx: Parotid mass [K11.8 (ICD-10-CM)]    History: CT angio head neck w/wo , MR Angio head w/o , CT head wo , US  soft tissue head & neck  Provider : Okey Burns, MD  Contact : [phone]     ----- MRI neck w/ contrast ordered - not sure if that needs to be scheduled before bx is done

## 2024-09-11 ENCOUNTER — Encounter (INDEPENDENT_AMBULATORY_CARE_PROVIDER_SITE_OTHER): Payer: Self-pay | Admitting: Otolaryngology

## 2024-09-11 ENCOUNTER — Ambulatory Visit (HOSPITAL_COMMUNITY)
Admission: RE | Admit: 2024-09-11 | Discharge: 2024-09-11 | Disposition: A | Discharge status: Home / Self Care | Source: Ambulatory Visit | Attending: Otolaryngology | Admitting: Otolaryngology

## 2024-09-11 ENCOUNTER — Ambulatory Visit (INDEPENDENT_AMBULATORY_CARE_PROVIDER_SITE_OTHER): Admitting: Otolaryngology

## 2024-09-11 VITALS — BP 154/86 | HR 57

## 2024-09-11 DIAGNOSIS — K118 Other diseases of salivary glands: Secondary | ICD-10-CM | POA: Diagnosis not present

## 2024-09-11 DIAGNOSIS — D37030 Neoplasm of uncertain behavior of the parotid salivary glands: Secondary | ICD-10-CM

## 2024-09-11 DIAGNOSIS — K116 Mucocele of salivary gland: Secondary | ICD-10-CM

## 2024-09-11 MED ORDER — GADOBUTROL 1 MMOL/ML IV SOLN
10.0000 mL | Freq: Once | INTRAVENOUS | Status: AC | PRN
Start: 2024-09-11 — End: 2024-09-11
  Administered 2024-09-11: 10 mL via INTRAVENOUS

## 2024-09-11 NOTE — Progress Notes (Signed)
 BP medication is paused at the moment. Patient admits to being anxious when coming to the doctor.

## 2024-09-11 NOTE — Progress Notes (Signed)
 ENT CONSULT:  Reason for Consult: parotid mass   HPI: Discussed the use of AI scribe software for clinical note transcription with the patient, who gave verbal consent to proceed.  History of Present Illness Roy [name] is a 53 year old male who presents with a tumor of the right parotid gland.  Approximately one month ago, he noticed a mass in his neck after attending a wedding in [place], [place] . The mass appeared suddenly, described as having 'popped up' overnight, and has not changed in size since its appearance. He experiences no pain associated with the mass but mentions occasional tingling sensations in the area. No facial weakness or history of skin cancer is reported.  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 He is no longer on blood thinners and reports doing well without them.    Records Reviewed:  Neurology 08/21/24 Dr Whitfield  [name] is a 53 y.o. male with history of hypertension, hyperlipidemia, CAD, smoker, left P-comm aneurysm status post coil 2021 who presented to [facility] ED on 07/12/2024 with chills, malaise, urinary frequency, right arm weakness and numbness.  MRI brain showed left MCA scattered infarcts secondary to unclear source.  CTA head/neck showed mild left P1 and P2 stenosis.  EF 60 to 65%.  LE Doppler negative for DVT.  TEE no evidence of PFO.  Loop recorder placed.  LDL 128.  A1c 5.8.  UDS positive for THC.  Recommended DAPT for 3 weeks and aspirin  alone and increased atorvastatin  to 80 mg daily.  Tobacco and THC cessation counseling provided.  He was also treated for AKI, UTI and sepsis by primary team.  Antihypertensive regimen adjusted due to episode of hypotension.  On phentermine PTA and discontinued due to increased stroke risk.  Gradually recovered and discharged home without therapy needs.    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   Hypertension    Renal calculi    Tobacco abuse     Past Surgical History:  Procedure Laterality Date   ANEURYSM COILING     a few years ago   DECORTICATION Right 09/02/2018   Procedure: DECORTICATION;  Surgeon: Kerrin Elspeth BROCKS, MD;  Location: [facility] OR;  Service: Thoracic;  Laterality: Right;   LOOP RECORDER INSERTION N/A 07/17/2024   Procedure: LOOP RECORDER INSERTION;  Surgeon: Lesia Ozell Barter, PA-C;  Location: [facility] INVASIVE CV LAB;  Service: Cardiovascular;  Laterality: N/A;   PLEURAL EFFUSION DRAINAGE Right 09/02/2018   Procedure: DRAINAGE OF PLEURAL EFFUSION;  Surgeon: Kerrin Elspeth BROCKS, MD;  Location: [facility] OR;  Service: Thoracic;  Laterality: Right;   TRANSESOPHAGEAL ECHOCARDIOGRAM (CATH LAB) N/A 07/17/2024   Procedure: TRANSESOPHAGEAL ECHOCARDIOGRAM;  Surgeon: Lonni Slain, MD;  Location: [facility] INVASIVE CV LAB;  Service: Cardiovascular;  Laterality: N/A;   VIDEO ASSISTED THORACOSCOPY (VATS)/THOROCOTOMY Right 09/02/2018   Procedure: VIDEO ASSISTED THORACOSCOPY (VATS)/possible THOROCOTOMY;  Surgeon: Kerrin Elspeth BROCKS, MD;  Location: [facility] OR;  Service: Thoracic;  Laterality: Right;    Family History  Problem Relation Age of Onset   Lung cancer Father    Throat cancer Father     Social History:  reports that he has quit smoking. His smoking use included cigarettes. He uses smokeless tobacco. He reports that he does not currently use drugs after having used the  following drugs: Marijuana. No history on file for alcohol use.  Allergies: No Known Allergies  Medications: I have reviewed the patient's current medications.  The PMH, PSH, Medications, Allergies, and SH were reviewed and updated.  ROS: Constitutional: Negative for fever, weight loss and weight gain. Cardiovascular: Negative for chest pain and dyspnea on exertion. Respiratory: Is not experiencing shortness of breath at rest. Gastrointestinal:  Negative for nausea and vomiting. Neurological: Negative for headaches. Psychiatric: The patient is not nervous/anxious  Blood pressure (!) 154/86, pulse (!) 57, SpO2 96%. There is no height or weight on file to calculate BMI.  PHYSICAL EXAM:  Exam: General: Well-developed, well-nourished Communication and Voice: Clear pitch and clarity Respiratory Respiratory effort: Equal inspiration and expiration without stridor Cardiovascular Peripheral Vascular: Warm extremities with equal color/perfusion Eyes: No nystagmus with equal extraocular motion bilaterally Neuro/Psych/Balance: Patient oriented to person, place, and time; Appropriate mood and affect; Gait is intact with no imbalance; Cranial nerves I-XII are intact Head and Face Inspection: Normocephalic and atraumatic without mass or lesion Palpation: Facial skeleton intact without bony stepoffs Salivary Glands:Right parotid mass ~ 2 cm Facial Strength: Facial motility symmetric and full bilaterally ENT Pinna: External ear intact and fully developed External canal: Canal is patent with intact skin Tympanic Membrane: Clear and mobile External Nose: No scar or anatomic deformity Lips, Teeth, and gums: Mucosa and teeth intact and viable TMJ: No pain to palpation with full mobility Oral cavity/oropharynx: No erythema or exudate, no lesions present Neck Neck and Trachea: Midline trachea without mass or lesion Thyroid : No mass or nodularity Lymphatics: No lymphadenopathy  Studies Reviewed: MR neck w/contrast     Neck U/S soft tissue 09/04/24 SOFT TISSUES: There is a complex cystic lesion within the superficial lobe of the right parotid measuring approximately 2.5 x 2.0 x 2.4 cm. There is blood flow present within the periphery of the lesion and there are internal echoes. Neoplasm is diagnosis of exclusion and biopsy is recommended.   IMPRESSION: 1. Complex cystic lesion within the superficial lobe of the right  parotid measuring approximately 2.5 x 2.0 x 2.4 cm, with peripheral blood flow and internal echoes. 2. Neoplasm is a diagnosis of exclusion; biopsy is recommended.   MRI brain 07/14/24 IMPRESSION: 1. Scattered foci of restricted diffusion within the left cerebral hemisphere, including the precentral and postcentral gyri and the occipital lobe, which are compatible with acute Embolic Nonhemorrhagic infarcts. 2. Numerous scattered foci of increased T2 signal within the subcortical and deep cerebral white matter. 3. Disconjugate gaze. 4. 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 Cystic neoplasm remains a diagnosis of exclusion.  Assessment/Plan: Encounter Diagnoses  Name Primary?   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 Differential includes benign and malignant tumor of the parotid gland. FNA ordered and pending.  - schedule FNA which I already ordered  - Recommend surgical removal due to potential growth and increased difficulty with larger tumors. - Plan for nerve monitoring and dissection to mitigate facial nerve weakness risk. - RTC after biopsy in 4 weeks  Thank you for allowing me to participate in the care of this patient. Please do not hesitate to contact me with any questions or concerns.   Elena Larry, MD Otolaryngology [facility] Phone: [phone] Fax: [phone]    09/11/2024, 11:55 AM

## 2024-09-12 NOTE — Progress Notes (Signed)
 09/18/24 11:26 AM   Roy [name] October 14, 1971 [id number]   HPI: 53 y.o. male here for initial evaluation of UTI, nephrolithiasis Admitted 07/12/2024-AMS, suspicion for CVA, also sepsis CT stone (07/12/2024)-bilateral nonobstructive renal stones, 10 mm RLP, 5 mm LLP Urine culture (07/12/2024)-100 K Citrobacter   - Treated 7 days p.o. Levaquin  750  Of note, history of GH Previously seen by [facility] urology-Dr. 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   Hypertension    Renal calculi    Tobacco abuse     Surgical History: Past Surgical History:  Procedure Laterality Date   ANEURYSM COILING     a few years ago   DECORTICATION Right 09/02/2018   Procedure: DECORTICATION;  Surgeon: Kerrin Elspeth BROCKS, MD;  Location: [facility] OR;  Service: Thoracic;  Laterality: Right;   LOOP RECORDER INSERTION N/A 07/17/2024   Procedure: LOOP RECORDER INSERTION;  Surgeon: Lesia Ozell Barter, PA-C;  Location: [facility] INVASIVE CV LAB;  Service: Cardiovascular;  Laterality: N/A;   PLEURAL EFFUSION DRAINAGE Right 09/02/2018   Procedure: DRAINAGE OF PLEURAL EFFUSION;  Surgeon: Kerrin Elspeth BROCKS, MD;  Location: [facility] OR;  Service: Thoracic;  Laterality: Right;   TRANSESOPHAGEAL ECHOCARDIOGRAM (CATH LAB) N/A 07/17/2024   Procedure: TRANSESOPHAGEAL ECHOCARDIOGRAM;  Surgeon: Lonni Slain, MD;  Location: [facility] INVASIVE CV LAB;  Service: Cardiovascular;  Laterality: N/A;   VIDEO ASSISTED THORACOSCOPY (VATS)/THOROCOTOMY Right 09/02/2018   Procedure: VIDEO ASSISTED THORACOSCOPY (VATS)/possible THOROCOTOMY;  Surgeon: Kerrin Elspeth BROCKS, MD;  Location: [facility] OR;  Service: Thoracic;  Laterality: Right;    Family History: Family History   Problem Relation Age of Onset   Lung cancer Father    Throat cancer Father     Social History:  reports that he has quit smoking. His smoking use included cigarettes. He uses smokeless tobacco. He reports that he does not currently use drugs after having used the following drugs: Marijuana. No history on file for alcohol use.      Physical Exam: BP (!) 179/89   Pulse 71   Ht 6' 3 (1.905 m)   Wt 240 lb (108.9 kg)   BMI 30.00 kg/m    Constitutional:  Alert and oriented, No acute distress. Cardiovascular: No clubbing, cyanosis, or edema. Respiratory: Normal respiratory effort, no increased work of breathing. GI: Nondistended Skin: No rashes, bruises or suspicious lesions. Neurologic: Grossly intact, no focal deficits, moving all 4 extremities. Psychiatric: Normal mood and affect.  Laboratory Data:  Latest Reference Range & Units 07/24/24 16:11  Creatinine 0.76 - 1.27 mg/dL 8.91     Pertinent Imaging: I have personally viewed and interpreted the CT stone 07/12/2024- bilateral nonobstructive renal stones, 10 mm RLP, 5 mm LLP, bilateral perinephric stranding, no ureteral stones.  Bladder normal contours and thickness.  40-50 g prostate average for age.     Assessment & Plan:    Nephrolithiasis Assessment & Plan: Bilateral nonobstructive renal stones, 10 mm RLP, 5 mm LLP Recent Citrobacter UTI Recurrent nephrolithiasis, prior URS in 2018  I reviewed his CT imaging from July with him today.  He has only had 1 isolated UTI, unclear if this was a true UTI or possibly related to catheterization during hospitalization.  I would not make the  correlation between nonobstructive stones and infectious nidus just yet.  For now, we will survey his stones in 1 year.  I discussed the shared decision making and thresholds for preemptive intervention-May certainly need to consider a repeat ureteroscopy laser lithotripsy.   - RTC in 1 year with RBUS prior.  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 Discussed potential etiologies including infectious, inflammatory, bladder-related, benign prostatic hyperplasia (BPH), and musculoskeletal/pelvic floor contributions. Reviewed the standard diagnostic workup (urinalysis, PVR, uroflow, prostate assessment, possible cystoscopy or imaging) and the spectrum of initial management strategies ranging from behavioral and lifestyle measures to pharmacologic therapy, with procedural options if indicated. All questions were addressed and the patient expressed understanding of the evaluation and treatment pathway.  -He may be interested in Flomax in the future.  He was a bit hesitant regarding potential ejaculatory dysfunction.  Uroxatral is also an option - Otherwise lifestyle modifications and expectant management for now        Penne Skye, MD 09/18/2024  [facility] [address] [place], [place] [address] [phone]

## 2024-09-12 NOTE — Assessment & Plan Note (Addendum)
 Recent Citrobacter UTI (July 2025) Isolated event, first lifetime UTI Unclear if related to hospitalization/catheterization I do not think related to bilateral nephrolithiasis  - PVR 0cc today - Expectant management for now

## 2024-09-12 NOTE — Assessment & Plan Note (Addendum)
 Bilateral nonobstructive renal stones, 10 mm RLP, 5 mm LLP Recent Citrobacter UTI Recurrent nephrolithiasis, prior URS in 2018  I reviewed his CT imaging from July with him today.  He has only had 1 isolated UTI, unclear if this was a true UTI or possibly related to catheterization during hospitalization.  I would not make the correlation between nonobstructive stones and infectious nidus just yet.  For now, we will survey his stones in 1 year.  I discussed the shared decision making and thresholds for preemptive intervention-May certainly need to consider a repeat ureteroscopy laser lithotripsy.   - RTC in 1 year with RBUS prior.  History of uric acid stones, KUB may be unreliable

## 2024-09-13 ENCOUNTER — Ambulatory Visit: Payer: Self-pay

## 2024-09-13 NOTE — Telephone Encounter (Signed)
 FYI Only or Action Required?: Action required by provider: clinical question for provider and update on patient condition. 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 Dr Tobie has been checking on his mass and wants to see if Tobie will send in abx for swelling.   Patient was last seen in primary care on 08/21/2024 by Harrie Bruckner, DO.  Called Nurse Triage reporting Dental Pain.  Symptoms began yesterday.  Interventions attempted: OTC medications: tylenol .  Symptoms are: gradually worsening.  Triage Disposition: Call Dentist When Office is Open  Patient/caregiver understands and will follow disposition?: No, wishes to speak with PCP  Copied from CRM #[id number]. Topic: Clinical - Red Word Triage >> Sep 13, 2024 11:29 AM Susanna ORN wrote: Red Word that prompted transfer to Nurse Triage: Patient states he has swelling in the inside of his jaw. States that he's not sure if it's because of the tumor that he just found out about or if it's an issue with his tooth. Wanting to speak with someone about getting antibiotics. Reason for Disposition  Toothache present > 24 hours  Answer Assessment - Initial Assessment Questions Swelling started yesterday evening- Drove from [place] and by [place]  Mouth wash, and tooth brushing- sore to the area. Tylenol  for pain.  No cavities. Can tell inside his mouth is swollen in the area but cannot tell on outside due to parotid mass obscuring.   1. ONSET: When did your mouth start hurting? (e.g., hours or days ago)      Yesterday evening (9/30) 2. SEVERITY: How bad is the pain? (Scale 1-10; or mild, moderate or severe)     Sore- pain last night 9/10, pain 6-7/10 today 3. SORES: Are there any sores or ulcers in the mouth? If Yes, ask: What part of the mouth are the sores in?     denies 4. FEVER: Do you have a fever? If Yes, ask: What is your temperature, how was it measured, and when did it start?     denies 5. CAUSE: What do you  think is causing the mouth pain?     Parotid mass causing pressure or infection 6. OTHER SYMPTOMS: Do you have any other symptoms? (e.g., difficulty breathing)     BP 140/90  Answer Assessment - Initial Assessment Questions 1. LOCATION: Which tooth is hurting?  (e.g., right-side/left-side, upper/lower, front/back)     By the Right upper wisdom tooth 2. ONSET: When did the toothache start?  (e.g., hours, days)      yesterday 3. SEVERITY: How bad is the toothache?  (Scale 1-10; mild, moderate or severe)     9/10 yesterday, 6-7/10 today 4. SWELLING: Is there any visible swelling of your face?     Not visible on outside due to Parotid Mass on the same side 5. OTHER SYMPTOMS: Do you have any other symptoms? (e.g., fever)     Denies  Protocols used: Mouth Pain-A-AH, Toothache-A-AH

## 2024-09-14 NOTE — Telephone Encounter (Signed)
 RTC to patient informed about need to be seen prior to getting antibiotics.  Is currently out of town.  Had wife  ring in some that he had left over.   Feels better.  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  Will forward to Schedulers.

## 2024-09-18 ENCOUNTER — Ambulatory Visit (INDEPENDENT_AMBULATORY_CARE_PROVIDER_SITE_OTHER): Admitting: Urology

## 2024-09-18 ENCOUNTER — Ambulatory Visit (INDEPENDENT_AMBULATORY_CARE_PROVIDER_SITE_OTHER)

## 2024-09-18 ENCOUNTER — Encounter

## 2024-09-18 ENCOUNTER — Telehealth: Payer: Self-pay | Admitting: *Deleted

## 2024-09-18 VITALS — BP 179/89 | HR 71 | Ht 75.0 in | Wt 240.0 lb

## 2024-09-18 DIAGNOSIS — N401 Enlarged prostate with lower urinary tract symptoms: Secondary | ICD-10-CM | POA: Diagnosis not present

## 2024-09-18 DIAGNOSIS — N200 Calculus of kidney: Secondary | ICD-10-CM | POA: Diagnosis not present

## 2024-09-18 DIAGNOSIS — I639 Cerebral infarction, unspecified: Secondary | ICD-10-CM | POA: Diagnosis not present

## 2024-09-18 DIAGNOSIS — N3001 Acute cystitis with hematuria: Secondary | ICD-10-CM | POA: Diagnosis not present

## 2024-09-18 DIAGNOSIS — N400 Enlarged prostate without lower urinary tract symptoms: Secondary | ICD-10-CM | POA: Insufficient documentation

## 2024-09-18 LAB — CUP PACEART REMOTE DEVICE CHECK
Date Time Interrogation Session: 20251005234239
Implantable Pulse Generator Implant Date: 20250804

## 2024-09-18 LAB — BLADDER SCAN AMB NON-IMAGING: Scan Result: 0

## 2024-09-18 NOTE — Telephone Encounter (Signed)
 Copied from CRM [id number]. Topic: Clinical - Medical Advice >> Sep 18, 2024  3:26 PM Alfonso ORN wrote: Reason for CRM: patient ask to speak with a provider or nurse want to know why is he is been schedule for a biopsy on 10/02/24   [facility] Agent provided the [place] the ENT specialist number

## 2024-09-18 NOTE — Telephone Encounter (Signed)
 Talked to the patient about this. He understands when the biopsy is. He will talk to his ENT doctor about the procedure.

## 2024-09-18 NOTE — Assessment & Plan Note (Addendum)
 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 Discussed potential etiologies including infectious, inflammatory, bladder-related, benign prostatic hyperplasia (BPH), and musculoskeletal/pelvic floor contributions. Reviewed the standard diagnostic workup (urinalysis, PVR, uroflow, prostate assessment, possible cystoscopy or imaging) and the spectrum of initial management strategies ranging from behavioral and lifestyle measures to pharmacologic therapy, with procedural options if indicated. All questions were addressed and the patient expressed understanding of the evaluation and treatment pathway.  -He may be interested in Flomax in the future.  He was a bit hesitant regarding potential ejaculatory dysfunction.  Uroxatral is also an option - Otherwise lifestyle modifications and expectant management for now

## 2024-09-19 ENCOUNTER — Telehealth (INDEPENDENT_AMBULATORY_CARE_PROVIDER_SITE_OTHER): Payer: Self-pay

## 2024-09-19 NOTE — Progress Notes (Signed)
 Remote Loop Recorder Transmission

## 2024-09-19 NOTE — Telephone Encounter (Signed)
 Called patient back and explained why he needs biopsy. Patient just wanted to know how big the mass is and if it looks malignant. (I read your notes from the visit on 9/29, I just wanted to make sure I am telling him the right thing.) Please advise.

## 2024-09-19 NOTE — Telephone Encounter (Signed)
 Pt called and left a VM stating he would like a phone call with more information on the Biopsy that he is having done.

## 2024-09-19 NOTE — Telephone Encounter (Signed)
 Called Pt he stated that he hasn't had the biopsy done yet he was wondering why he needs the biopsy because with the MRI with contrast that you should be able to to see everything. Pt also stated that he wants to know why your doing a biopsy instead of just removing it all together.

## 2024-09-21 ENCOUNTER — Ambulatory Visit: Payer: Self-pay

## 2024-09-21 ENCOUNTER — Encounter

## 2024-09-21 NOTE — Telephone Encounter (Signed)
 FYI Only or Action Required?: FYI only for provider.  Patient was last seen in primary care on 08/21/2024 by Harrie Bruckner, DO.  Called Nurse Triage reporting Facial Swelling.  Symptoms began several weeks ago.  Interventions attempted: OTC medications: eye drops and Prescription medications: left over abx.  Symptoms are: unchanged.  Triage Disposition: See PCP When Office is Open (Within 3 Days)  Patient/caregiver understands and will follow disposition?: Yes  Copied from CRM #[id number]. Topic: Clinical - Red Word Triage >> Sep 21, 2024 10:44 AM Debby BROCKS wrote: Red Word that prompted transfer to Nurse Triage: Swollen under the eye for about 3 weeks. He wants to know if he should make an appointment or go to an eye doctor Reason for Disposition  [1] MILD eyelid swelling (puffiness) AND [2] persists > 3 days  (Exception: Suspect mosquito bites.)  Answer Assessment - Initial Assessment Questions Puffy and film in the corner of the Left outside corner of his eye, slightly smaller than a dime. Present for about 3 weeks. Eye drops, grainy and dry, blurry in corner. Has tried eye drops, had left over Abx at home he tried with no resolution. Denies pain, discharge or fever, field cuts or vision loss. 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  --Patient sent pictures in today under results telephone encounter 08/22/24--- He will be out driving until tomorrow mid day but is off in the morning on Monday- scheduled with provider Monday to evaluate- Callback/ED/UC precautions given with understanding.  1. ONSET: When did the swelling start? (e.g., minutes, hours, days)     3 weeks  2. LOCATION: What part of the eyelids is swollen?     Left eye corner closest to ear. 3. SEVERITY: How swollen is it?     Can tell looking at him but is still able to close his eye.  4. ITCHING: Is there any itching? If Yes, ask: How much?   (Scale 1-10; mild, moderate or severe)     denies 5. PAIN: Is the  swelling painful to touch? If Yes, ask: How painful is it?   (Scale 1-10; mild, moderate or severe)     denies 6. FEVER: Do you have a fever? If Yes, ask: What is it, how was it measured, and when did it start?      denies 7. CAUSE: What do you think is causing the swelling?     unsure 8. RECURRENT SYMPTOM: Have you had eyelid swelling before? If Yes, ask: When was the last time? What happened that time?     denies 9. OTHER SYMPTOMS: Do you have any other symptoms? (e.g., blurred vision, eye discharge, rash, runny nose)     Causing some blurry in that corner  Protocols used: Eye - Swelling-A-AH

## 2024-09-21 NOTE — Telephone Encounter (Addendum)
 See My Chart message for pictures. Pt has an appt 09/25/24. 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 Instructed pt if he noticed a change in his vision, increased redness, pain to go to UC - stated he understands.

## 2024-09-22 ENCOUNTER — Encounter: Payer: Self-pay | Admitting: Student

## 2024-09-25 ENCOUNTER — Telehealth (INDEPENDENT_AMBULATORY_CARE_PROVIDER_SITE_OTHER): Payer: Self-pay | Admitting: Otolaryngology

## 2024-09-25 ENCOUNTER — Ambulatory Visit: Payer: Self-pay

## 2024-09-25 ENCOUNTER — Ambulatory Visit: Payer: Self-pay | Admitting: Student

## 2024-09-25 ENCOUNTER — Telehealth (INDEPENDENT_AMBULATORY_CARE_PROVIDER_SITE_OTHER): Payer: Self-pay

## 2024-09-25 NOTE — Telephone Encounter (Signed)
 The patient has his biopsy scheduled for Oct 20th at [facility].  He wants to check with Dr Soldatova and make sure it would be ok for him to go to work right after this biopsy.  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  Please advise.

## 2024-09-25 NOTE — Telephone Encounter (Signed)
 Pt calling states that he is wanting to cancel appt today as his eye doctor can see him today for the eye swelling. Pt advised to call back if needed.

## 2024-09-25 NOTE — Telephone Encounter (Signed)
 Called patient back with an answer to his question.

## 2024-09-29 NOTE — Progress Notes (Signed)
 Patient for US  guided FNA Parotid mass biopsy on Monday 10/02/24, I called and spoke with the patient on the phone and gave pre-procedure instructions. Pt was made aware to be here at 12:30 and check in at the [facility] registration desk. Pt stated understanding. Called 09/29/24

## 2024-10-02 ENCOUNTER — Ambulatory Visit
Admission: RE | Admit: 2024-10-02 | Discharge: 2024-10-02 | Disposition: A | Discharge status: Home / Self Care | Source: Ambulatory Visit | Attending: Otolaryngology | Admitting: Otolaryngology

## 2024-10-02 DIAGNOSIS — K118 Other diseases of salivary glands: Secondary | ICD-10-CM | POA: Insufficient documentation

## 2024-10-02 MED ORDER — LIDOCAINE HCL (PF) 1 % IJ SOLN
5.0000 mL | Freq: Once | INTRAMUSCULAR | Status: DC
  Filled 2024-10-02: qty 5

## 2024-10-02 NOTE — Procedures (Signed)
 Interventional Radiology Procedure Note  Procedure: US  Guided FNA of right parotid lesion  Complications: None  Estimated Blood Loss: < 10 mL  Findings: 18 G aspiration of 2.5 cm right parotid lesion performed under US  guidance.  5 mL of turbid, yellow fluid obtained and sent for cytologic analysis. Cystic lesion decompressed after aspiration.  Marcey DASEN. Luverne, M.D Pager:  [phone]

## 2024-10-03 ENCOUNTER — Telehealth (INDEPENDENT_AMBULATORY_CARE_PROVIDER_SITE_OTHER): Payer: Self-pay

## 2024-10-03 NOTE — Telephone Encounter (Signed)
 He does not have any new results at this time. He had the FNA completed but the results are not available. You can let him know someone will be calling him once they are complete.

## 2024-10-04 LAB — CYTOLOGY - NON PAP

## 2024-10-06 ENCOUNTER — Telehealth (INDEPENDENT_AMBULATORY_CARE_PROVIDER_SITE_OTHER): Payer: Self-pay | Admitting: Physician Assistant

## 2024-10-06 ENCOUNTER — Telehealth (INDEPENDENT_AMBULATORY_CARE_PROVIDER_SITE_OTHER): Payer: Self-pay

## 2024-10-06 ENCOUNTER — Telehealth: Payer: Self-pay | Admitting: *Deleted

## 2024-10-06 NOTE — Telephone Encounter (Signed)
 Called patient back and let him know that a provider will need to interpret the results of his biopsy. I let him know that we would give him a call back when that happens today.

## 2024-10-06 NOTE — Telephone Encounter (Signed)
 Will forward to JINNY Cohen, PA                       Copied from CRM [id number]. Topic: Clinical - Lab/Test Results >> Oct 06, 2024 10:08 AM Debby BROCKS wrote: Reason for CRM: Patient states he has a biopsy on Monday and would like the results read to him when available

## 2024-10-06 NOTE — Telephone Encounter (Signed)
 53 year old gentleman who originally saw Dr. Okey secondary to swelling of the right parotid.  He underwent a ultrasound soft tissue head and neck on 09/04/2024 that was significant for a complex cystic lesion within the superficial lobe of the right parotid measuring approximately 2.5 x 2 x 2.4 cm, he had a follow-up MRI on 09/11/2024 with similar findings.  He had an FNA that was completed on 10/02/2024 with pathology showing predominantly acellular specimen with no epithelial cells present, material consist exclusively of proteinaceous fluid and rare histiocytes consistent with a cyst.  Patient had HIV testing that was negative.  I called to discuss the results with the patient, given the pathology was negative for malignancy we discussed several options.  I discussed the results with Dr. Roark, he advised it is reasonable to offer attempt at an office drainage, if he has recurrence we would discuss options including parotidectomy.  The patient was very happy with the plan, he would like to be seen in the office for this.  He had no further questions or concerns.

## 2024-10-06 NOTE — Telephone Encounter (Signed)
 The patient got his FNA

## 2024-10-07 ENCOUNTER — Ambulatory Visit: Payer: Self-pay | Admitting: Cardiology

## 2024-10-09 ENCOUNTER — Telehealth (INDEPENDENT_AMBULATORY_CARE_PROVIDER_SITE_OTHER): Payer: Self-pay | Admitting: Physician Assistant

## 2024-10-09 ENCOUNTER — Telehealth (INDEPENDENT_AMBULATORY_CARE_PROVIDER_SITE_OTHER): Payer: Self-pay

## 2024-10-09 NOTE — Telephone Encounter (Signed)
 Pt called and LVM for you stating  Good morning miss Roy [name] I was calling to get my results

## 2024-10-09 NOTE — Telephone Encounter (Signed)
 Patient called wanting to try to keep appointment on either a Monday morning or Friday afternoon, due to the fact that he is an [employer]TR [employer] and is not in town during the week.  Told him we were trying to speak to Dr. Roark and would reach back out to him.

## 2024-10-09 NOTE — Telephone Encounter (Signed)
 I spoke with him about his results last week, I would touch base with him and see what results he is looking for. The last we left it was that we were going to schedule him for a follow up with me on a day Dr. Roark was in clinic for drainage of the cyst found.

## 2024-10-09 NOTE — Telephone Encounter (Signed)
 LVM for patient regarding VM that he left.

## 2024-10-16 ENCOUNTER — Ambulatory Visit (INDEPENDENT_AMBULATORY_CARE_PROVIDER_SITE_OTHER): Admitting: Otolaryngology

## 2024-10-16 ENCOUNTER — Other Ambulatory Visit (HOSPITAL_COMMUNITY)

## 2024-10-16 ENCOUNTER — Encounter (INDEPENDENT_AMBULATORY_CARE_PROVIDER_SITE_OTHER): Payer: Self-pay | Admitting: Physician Assistant

## 2024-10-16 ENCOUNTER — Other Ambulatory Visit (HOSPITAL_COMMUNITY)
Admission: RE | Admit: 2024-10-16 | Discharge: 2024-10-16 | Disposition: A | Discharge status: Home / Self Care | Source: Ambulatory Visit | Attending: Physician Assistant | Admitting: Physician Assistant

## 2024-10-16 ENCOUNTER — Ambulatory Visit (INDEPENDENT_AMBULATORY_CARE_PROVIDER_SITE_OTHER): Admitting: Physician Assistant

## 2024-10-16 VITALS — BP 135/84

## 2024-10-16 DIAGNOSIS — K118 Other diseases of salivary glands: Secondary | ICD-10-CM | POA: Diagnosis not present

## 2024-10-16 NOTE — Progress Notes (Unsigned)
 Patient having BP managed

## 2024-10-16 NOTE — Progress Notes (Unsigned)
 Dear Dr. Benuel, Here is my assessment for our mutual patient, Roy [name]. Thank you for allowing me the opportunity to care for your patient. Please do not hesitate to contact me should you have any other questions. Sincerely, Chyrl Cohen PA-C  Otolaryngology Clinic Note Referring provider: Dr. Benuel HPI:  Roy [name] is a 53 y.o. male kindly referred by Dr. Benuel   Discussed the use of AI scribe software for clinical note transcription with the patient, who gave verbal consent to proceed.  History of Present Illness    Roy [name] is a 53 year old male who presents for follow-up evaluation of right parotid cyst.  He was last seen by Dr. Okey.  Since the last office visit he did have an MRI that was significant for 2.5 x 2.6 x 2 point centimeter predominantly cystic mass in the right parotid gland with mild thickening and irregular rim with no adenopathy.  He had follow-up FNA which was significant for predominantly acellular specimen with no epithelial cells present, material consistent exclusive proteinaceous fluid and rare histiocytes consistent with cyst.  He notes that when he had the FNA done they took a large volume of fluid off, he notes the cyst went away, and the next day and although not as large as prior.  He denies any associated pain, no fever, no history of the same.  The symptoms are started abruptly.  He does take a daily aspirin .  He does have a history of stroke with no neurologic deficits.  He denies any neurologic symptoms including any facial weakness or numbness.      Independent Review of Additional Tests or Records:  Cytology on 10/02/2024, MRI of the neck with contrast on 09/11/2024  INTERPRETATION(S):      -  PREDOMINANTLY ACELLULAR SPECIMEN WITH NO EPITHELIAL CELLS PRESENT.       -  MATERIAL CONSISTS EXCLUSIVELY OF PROTEINACEOUS FLUID AND RARE HISTIOCYTES       CONSISTENT WITH CYST.   IMPRESSION: 2.5 x 2.6 x 2.7 cm predominantly cystic  mass in the right parotid gland with a mildly thickened and irregular rim. No adenopathy.   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   Hypertension    Renal calculi    Tobacco abuse      Past Surgical History:  Procedure Laterality Date   ANEURYSM COILING     a few years ago   DECORTICATION Right 09/02/2018   Procedure: DECORTICATION;  Surgeon: Kerrin Elspeth BROCKS, MD;  Location: [facility] OR;  Service: Thoracic;  Laterality: Right;   LOOP RECORDER INSERTION N/A 07/17/2024   Procedure: LOOP RECORDER INSERTION;  Surgeon: Lesia Ozell Barter, PA-C;  Location: [facility] INVASIVE CV LAB;  Service: Cardiovascular;  Laterality: N/A;   PLEURAL EFFUSION DRAINAGE Right 09/02/2018   Procedure: DRAINAGE OF PLEURAL EFFUSION;  Surgeon: Kerrin Elspeth BROCKS, MD;  Location: [facility] OR;  Service: Thoracic;  Laterality: Right;   TRANSESOPHAGEAL ECHOCARDIOGRAM (CATH LAB) N/A 07/17/2024   Procedure: TRANSESOPHAGEAL ECHOCARDIOGRAM;  Surgeon: Lonni Slain, MD;  Location: [facility] INVASIVE CV LAB;  Service: Cardiovascular;  Laterality: N/A;   VIDEO ASSISTED THORACOSCOPY (VATS)/THOROCOTOMY Right 09/02/2018   Procedure: VIDEO ASSISTED THORACOSCOPY (VATS)/possible THOROCOTOMY;  Surgeon: Kerrin Elspeth BROCKS, MD;  Location: [facility] OR;  Service: Thoracic;  Laterality: Right;  Family History  Problem Relation Age of Onset   Lung cancer Father    Throat cancer Father      Social Connections: Unknown (07/12/2024)   Social Connection and Isolation Panel    Frequency of Communication with Friends and Family: More than three times a week    Frequency of Social Gatherings with Friends and Family: Not on file    Attends Religious Services: Not on file    Active Member of Clubs or Organizations: Not on file    Attends Banker  Meetings: Not on file    Marital Status: Not on file      Current Outpatient Medications:    acetaminophen  (TYLENOL ) 500 MG tablet, Take 2 tablets (1,000 mg total) by mouth every 6 (six) hours as needed for mild pain or fever., Disp: 30 tablet, Rfl: 0   albuterol  (VENTOLIN  HFA) 108 (90 Base) MCG/ACT inhaler, Inhale 2 puffs into the lungs every 6 (six) hours as needed for wheezing or shortness of breath., Disp: 8 g, Rfl: 2   aspirin  EC 81 MG tablet, Take 1 tablet (81 mg total) by mouth daily. Swallow whole., Disp: 30 tablet, Rfl: 1   atorvastatin  (LIPITOR) 80 MG tablet, Take 1 tablet (80 mg total) by mouth at bedtime., Disp: 30 tablet, Rfl: 1   [Paused] buPROPion (WELLBUTRIN SR) 150 MG 12 hr tablet, Take 150 mg by mouth 2 (two) times daily as needed (anxiety, relaxation)., Disp: , Rfl:    [Paused] chlorthalidone (HYGROTON) 25 MG tablet, Take 25 mg by mouth daily., Disp: , Rfl:    levofloxacin  (LEVAQUIN ) 750 MG tablet, Take 1 tablet (750 mg total) by mouth daily., Disp: 3 tablet, Rfl: 0   losartan (COZAAR) 50 MG tablet, Take 50 mg by mouth daily., Disp: , Rfl:    pregabalin (LYRICA) 75 MG capsule, Take 75-150 mg by mouth daily as needed (pain)., Disp: , Rfl:    SPIRIVA HANDIHALER 18 MCG inhalation capsule, Place 18 mcg into inhaler and inhale daily as needed (wheezing)., Disp: , Rfl:    SYMBICORT  160-4.5 MCG/ACT inhaler, Inhale 2 puffs into the lungs in the morning and at bedtime., Disp: , Rfl:    [Paused] VIAGRA 100 MG tablet, Take 100 mg by mouth as needed for erectile dysfunction., Disp: , Rfl:    Physical Exam:   BP 135/84   Pertinent Findings  CN II-XII grossly intact-no facial nerve weakness Firm swelling along the right parotid anterior to the tragus  No obviously palpable neck masses/lymphadenopathy/thyromegaly No respiratory distress or stridor       Seprately Identifiable Procedures:   Procedure: Fine Needle Aspiration (FNA) - Right Parotid Cyst Indication: right parotid  mass/cystic lesion for diagnostic evaluation  Consent: Risks, benefits, and alternatives were discussed, including bleeding, infection, facial nerve injury, pain, and nondiagnostic result. Verbal consent obtained.  Procedure Description: The skin overlying the parotid region was prepped with chlorhexidine and draped in a sterile manner. Local anesthesia was achieved with 1% lidocaine  with epinephrine . Using palpation and/or ultrasound guidance, a 25-gauge needle was inserted into the cystic lesion within the parotid gland. Aspiration was performed,  clear/yellow viscous fluid (specify) which was sent for cytologic analysis. 5 cc aspirated.  Estimated Blood Loss: None Complications: None Patient Tolerance: Procedure well tolerated Specimen: Parotid cyst fluid - sent to pathology for cytology  Impression: Successful fine needle aspiration of parotid cystic lesion.  Impression & Plans:  Roy [name] is a 53 y.o. male with the following   Assessment and Plan  Right parotid cyst  Confirmed by imaging and aspiration, no tumor cells on previous path.  Surgical removal considered if recurrence with enlargement. - Send aspirated fluid for cytology  - Schedule follow-up in two weeks. - Consider additional drainage if cyst size decreases. - Discuss surgical removal if cyst recurs and enlarges.           - f/u 2 weeks    Thank you for allowing me the opportunity to care for your patient. Please do not hesitate to contact me should you have any other questions.  Sincerely, Chyrl Cohen PA-C [facility] Phone: [phone] Fax: [phone]  10/16/2024, 2:50 PM

## 2024-10-17 ENCOUNTER — Telehealth (INDEPENDENT_AMBULATORY_CARE_PROVIDER_SITE_OTHER): Payer: Self-pay | Admitting: Physician Assistant

## 2024-10-17 NOTE — Telephone Encounter (Signed)
 The patient called in to report that the area that was drained in the office yesterday filled right back up overnight.  Please advise on next steps.

## 2024-10-18 ENCOUNTER — Telehealth (INDEPENDENT_AMBULATORY_CARE_PROVIDER_SITE_OTHER): Payer: Self-pay | Admitting: Physician Assistant

## 2024-10-18 LAB — CYTOLOGY - NON PAP

## 2024-10-18 NOTE — Telephone Encounter (Signed)
 I spoke with Roy [name] on the phone today, his cyst had filled up.  He would like to discuss surgical options.  I will speak with her physicians and arrange a follow-up with one of them to discuss further management.

## 2024-10-18 NOTE — Telephone Encounter (Signed)
 I attempted to call him back, LVM

## 2024-10-19 ENCOUNTER — Ambulatory Visit

## 2024-10-19 ENCOUNTER — Telehealth (INDEPENDENT_AMBULATORY_CARE_PROVIDER_SITE_OTHER): Payer: Self-pay | Admitting: Physician Assistant

## 2024-10-19 ENCOUNTER — Encounter

## 2024-10-19 DIAGNOSIS — K116 Mucocele of salivary gland: Secondary | ICD-10-CM

## 2024-10-19 DIAGNOSIS — I639 Cerebral infarction, unspecified: Secondary | ICD-10-CM

## 2024-10-19 LAB — CUP PACEART REMOTE DEVICE CHECK
Date Time Interrogation Session: 20251106000609
Implantable Pulse Generator Implant Date: 20250804

## 2024-10-19 NOTE — Telephone Encounter (Signed)
 I spoke with Dr. Anice about Mr. Schellenberg case.  He is willing to take him on as a patient.  He would like a CT of the neck with contrast prior to surgical discussions.  I will order the CT with contrast.  The patient will call our office and schedule follow-up visit approximately 2 weeks after the imaging has been completed.  He will reach out to us  in the meantime if he has any further questions or concerns.

## 2024-10-20 ENCOUNTER — Telehealth: Payer: Self-pay | Admitting: *Deleted

## 2024-10-20 NOTE — Progress Notes (Signed)
 Remote Loop Recorder Transmission

## 2024-10-20 NOTE — Telephone Encounter (Signed)
 Pt stated when he was in the hospital, Losartan was discontinued. Stated his BP started to increased; 150/80, 150/90 so he re-started taking it about 2 weeks ago; he had some at home. Stated now his BP fluctuates 130's, 120's.I told him I will inform his doctor.

## 2024-10-20 NOTE — Telephone Encounter (Signed)
 Copied from CRM #[id number]. Topic: Clinical - Medical Advice >> Oct 20, 2024 10:42 AM Farrel B wrote: Reason for CRM: Patient called in for [phone] [name] wanted to let the provider know that he had to start back to taking the blood pressure losartan (COZAAR) 50 MG tablet please call patient to advise.

## 2024-10-21 ENCOUNTER — Ambulatory Visit: Payer: Self-pay | Admitting: Cardiology

## 2024-10-23 ENCOUNTER — Encounter

## 2024-10-23 NOTE — Telephone Encounter (Signed)
 I called - stated he needs to look at his schedule first; asked if we could call him back in 15 minutes. Pt asked to keep a BP log and bring it with him at his appt.

## 2024-10-23 NOTE — Telephone Encounter (Signed)
 Pt has an appt with Dr Marylu on 11/13/24.

## 2024-10-30 ENCOUNTER — Encounter: Payer: Self-pay | Admitting: Student

## 2024-10-30 ENCOUNTER — Ambulatory Visit (INDEPENDENT_AMBULATORY_CARE_PROVIDER_SITE_OTHER): Admitting: Physician Assistant

## 2024-10-31 ENCOUNTER — Telehealth: Payer: Self-pay

## 2024-10-31 NOTE — Telephone Encounter (Signed)
 I called pt back, but no answer. I left detailed message to call the clinic back.

## 2024-10-31 NOTE — Telephone Encounter (Unsigned)
 Copied from CRM [id number]. Topic: General - Other >> Oct 31, 2024 11:57 AM Debby BROCKS wrote: Reason for CRM: Patient returned callback from [facility]. Stated the voicemail said May. [facility] currently at lunch and advised patient a message will be sent to see if May can reach out once more later on today

## 2024-10-31 NOTE — Telephone Encounter (Signed)
 Copied from CRM #[id number]. Topic: General - Other >> Oct 30, 2024 12:21 PM Chiquita SQUIBB wrote: Reason for CRM: Patient is calling in to see how he can get paperwork to his provider. >> Oct 30, 2024 12:29 PM Chiquita SQUIBB wrote: Patient is calling back in requesting if he can email the paperwork. Please advise the patient.

## 2024-11-01 ENCOUNTER — Telehealth: Payer: Self-pay | Admitting: Adult Health

## 2024-11-01 ENCOUNTER — Encounter: Payer: Self-pay | Admitting: Adult Health

## 2024-11-01 NOTE — Telephone Encounter (Signed)
 Pt called  stating that he need clearance from Np to get wisdom tooth pulled . Dentist is requesting forms be filled out before Pt can get surgery . Pt stated that he will send forms to NP through MyChart.

## 2024-11-01 NOTE — Telephone Encounter (Signed)
 I called and spoke with pt regarding medical clearance paperwork. I did received the paperwork through mychart, however it's not clear. Per Dr. Marylu, he would like pt to come in for an appt. Offered an appt to discuss about this, pt declined. Advised pt to call back if he needs further assistance.

## 2024-11-02 NOTE — Telephone Encounter (Signed)
 Would recommend patient waiting at least 3 months post stroke to undergo procedure requiring anesthesia unless it is an emergency where benefit outweighs risk.

## 2024-11-03 NOTE — Telephone Encounter (Signed)
 Form in Jessica's office for review/signature

## 2024-11-06 ENCOUNTER — Ambulatory Visit
Admission: RE | Admit: 2024-11-06 | Discharge: 2024-11-06 | Disposition: A | Discharge status: Home / Self Care | Source: Ambulatory Visit | Attending: Physician Assistant | Admitting: Physician Assistant

## 2024-11-06 ENCOUNTER — Ambulatory Visit: Admitting: Student

## 2024-11-06 DIAGNOSIS — K116 Mucocele of salivary gland: Secondary | ICD-10-CM | POA: Insufficient documentation

## 2024-11-06 MED ORDER — IOHEXOL 300 MG/ML  SOLN
75.0000 mL | Freq: Once | INTRAMUSCULAR | Status: AC | PRN
  Administered 2024-11-06: 75 mL via INTRAVENOUS

## 2024-11-13 ENCOUNTER — Ambulatory Visit: Admitting: Student

## 2024-11-13 NOTE — Telephone Encounter (Signed)
 Pt called to Follow up about faxing over Clearance  form to  Dental office . Pt stated  he was waiting on call   to confirm .  Informed Pt that  Np has Form I will send message to follow up With NP

## 2024-11-19 ENCOUNTER — Ambulatory Visit

## 2024-11-20 ENCOUNTER — Encounter

## 2024-11-20 LAB — CUP PACEART REMOTE DEVICE CHECK
Date Time Interrogation Session: 20251206232736
Implantable Pulse Generator Implant Date: 20250804

## 2024-11-23 ENCOUNTER — Encounter

## 2024-11-28 NOTE — Progress Notes (Signed)
 Remote Loop Recorder Transmission

## 2024-11-29 ENCOUNTER — Encounter (INDEPENDENT_AMBULATORY_CARE_PROVIDER_SITE_OTHER): Payer: Self-pay

## 2024-11-29 ENCOUNTER — Ambulatory Visit: Payer: Self-pay

## 2024-11-29 NOTE — Telephone Encounter (Signed)
 FYI Only or Action Required?: FYI only for provider: appointment scheduled on 12/04/24.  Patient was last seen in primary care on 08/21/2024 by Harrie Bruckner, DO.  Called Nurse Triage reporting Concussion.  Symptoms began today.  Interventions attempted: OTC medications: tylenol .  Symptoms are: unchanged.  Triage Disposition: Home Care  Patient/caregiver understands and will follow disposition?: yes  Wants to discuss resuming htn meds.   Copied from CRM #[id number]. Topic: Clinical - Red Word Triage >> Nov 29, 2024  1:41 PM Susanna ORN wrote: Red Word that prompted transfer to Nurse Triage: Patient wanting to schedule to be seen by his provider. States his blood pressure was elevated really high last night. He wants to discuss getting back on blood pressure medication & anxiety medications at a higher dose. States he did go to the ED by ambulance last night as his blood pressure was 200/100 and something. Upon discharge, he states it was around the 146/80 or 90. Reason for Disposition  [1] Concussion symptoms staying SAME  (not worse or better) AND [2] present < 3 days  Answer Assessment - Initial Assessment Questions 1. SYMPTOMS: What symptom are you most concerned about?     concussion 2. OTHER SYMPTOMS: Do you have any other symptoms? (e.g., nausea, difficulty concentrating)     no 3. ONSET / PATTERN:  Are you the same, getting better, or getting worse?  What's changed?     same 4. HEADACHE: Do you have any headache pain? If Yes, ask: How bad is it?  (Scale 0-10; none or mild, moderate, severe)     Dull 4/10, increased when moving head.  5. mTBI: When did your head injury happen? How did it occur?      Early am 6. mTBI DIAGNOSIS:  Who diagnosed your concussion?     ER 7. mTBI TESTING: Did you have a CT scan or MRI of your head?     YES 8. mTBI LAST VISIT:  When were you seen for your head injury?     Early am 9. MEDICINES:  Did you receive any  prescription medicines?  If Yes, ask:  What are they? Were any over-the-counter meds recommended?     denies 10. FOLLOW-UP APPT:  Do you have a follow-up appointment?       yes  Protocols used: Concussion (mTBI) Less Than 14 Days Ago Follow-up Call-A-AH

## 2024-12-01 ENCOUNTER — Ambulatory Visit: Payer: Self-pay | Admitting: Cardiology

## 2024-12-04 ENCOUNTER — Ambulatory Visit: Payer: Self-pay | Admitting: Student

## 2024-12-04 ENCOUNTER — Encounter: Payer: Self-pay | Admitting: Student

## 2024-12-04 VITALS — BP 167/90 | HR 54 | Temp 98.6°F | Ht 75.0 in | Wt 261.4 lb

## 2024-12-04 DIAGNOSIS — I10 Essential (primary) hypertension: Secondary | ICD-10-CM | POA: Diagnosis present

## 2024-12-04 DIAGNOSIS — Z1211 Encounter for screening for malignant neoplasm of colon: Secondary | ICD-10-CM

## 2024-12-04 DIAGNOSIS — Z122 Encounter for screening for malignant neoplasm of respiratory organs: Secondary | ICD-10-CM

## 2024-12-04 MED ORDER — LOSARTAN POTASSIUM 100 MG PO TABS: 100.0000 mg | ORAL_TABLET | Freq: Every day | ORAL | 3 refills | Status: AC

## 2024-12-04 MED ORDER — AMLODIPINE BESYLATE 5 MG PO TABS: 5.0000 mg | ORAL_TABLET | Freq: Every day | ORAL | 3 refills | Status: DC

## 2024-12-04 NOTE — Progress Notes (Signed)
 Internal Medicine Clinic Attending  Case discussed with the resident at the time of the visit.  We reviewed the resident's history and exam and pertinent patient test results.  I agree with the assessment, diagnosis, and plan of care documented in the resident's note.

## 2024-12-04 NOTE — Patient Instructions (Signed)
 Losartan  - I will refill to your current dose of 100mg  daily Amlodipine  - new medicine, dose is 5mg  daily  Please return in 1-2 months for follow-up blood pressure check particularly if your pressures remain high

## 2024-12-04 NOTE — Progress Notes (Signed)
" ° °  CC: High Blood Pressure  HPI:  [name] is a 53 y.o. male with a PMH stated below who presents today for assessment of blood pressure.  Please see problem based assessment and plan for additional details.  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  Vitals:   12/04/24 0922 12/04/24 1003  BP: (!) 169/101 (!) 167/90  Pulse:  (!) 54  Temp: 98.6 F (37 C)   TempSrc: Oral   SpO2: 96%   Weight: 261 lb 6.4 oz (118.6 kg)   Height: 6' 3 (1.905 m)    Physical Exam: Constitutional: well-appearing man in no acute distress Cardiovascular: regular rate and rhythm, no m/r/g Pulmonary/Chest: normal work of breathing on room air, lungs clear to auscultation bilaterally Abdominal: soft, non-tender, non-distended MSK: normal bulk and tone Neurological: alert & oriented x 3, no focal deficit Skin: warm and dry Psych: normal mood and behavior  Assessment & Plan:   Patient discussed with Dr. Lovie  Essential hypertension He made this visit for blood pressure management. He went to an ER in [place]  last week after persistent headaches secondary to a fall in [place] wherein he hit his head. Workup there was unrevealing but noted pressures as high as 200 systolic. Since then, no lasting issues and headaches resolved. However pressures are high in office, 167/90. Home measures are closer to goal around 140s/80s. He believes he has some white coat hypertension.  Given a history of stroke, his goal is less than 130/90 per his neurologist. I will add a second medicine today.  - Continue losartan  100 daily (he self-titrated from 50 to 100 about 2 weeks ago, a BMP last week in [place]  was stable) - Start amlodipine  5 daily - He is going to  exchange his home wrist cuff for an arm cuff which might be more accurate - Prior regimen included chlorthalidone and coreg  - stopped after UTI/sepsis in summer 2025. Can resume these if needed. - RTC 6 weeks for BP assessment  Healthcare Maintenance Referrals placed today for colonoscopy and low-dose CT. He quit smoking about 3 years ago but is over 50 with a 20 pack year history.  Lonni Africa, D.O. [facility] Internal Medicine, PGY-2 Phone: [phone] Date 12/04/2024 Time 10:12 AM "

## 2024-12-04 NOTE — Assessment & Plan Note (Addendum)
 He made this visit for blood pressure management. He went to an ER in [place]  last week after persistent headaches secondary to a fall in [place] wherein he hit his head. Workup there was unrevealing but noted pressures as high as 200 systolic. Since then, no lasting issues and headaches resolved. However pressures are high in office, 167/90. Home measures are closer to goal around 140s/80s. He believes he has some white coat hypertension.  Given a history of stroke, his goal is less than 130/90 per his neurologist. I will add a second medicine today.  - Continue losartan  100 daily (he self-titrated from 50 to 100 about 2 weeks ago, a BMP last week in [place]  was stable) - Start amlodipine  5 daily - He is going to exchange his home wrist cuff for an arm cuff which might be more accurate - Prior regimen included chlorthalidone and coreg  - stopped after UTI/sepsis in summer 2025. Can resume these if needed. - RTC 6 weeks for BP assessment

## 2024-12-15 ENCOUNTER — Encounter (INDEPENDENT_AMBULATORY_CARE_PROVIDER_SITE_OTHER): Payer: Self-pay

## 2024-12-15 ENCOUNTER — Telehealth (INDEPENDENT_AMBULATORY_CARE_PROVIDER_SITE_OTHER): Payer: Self-pay

## 2024-12-15 NOTE — Telephone Encounter (Signed)
 Patient called asking about results from CT scan and about getting scheduled. I let scheduling know he needed put on Dr. Vergie schedule.

## 2024-12-20 ENCOUNTER — Ambulatory Visit (INDEPENDENT_AMBULATORY_CARE_PROVIDER_SITE_OTHER)

## 2024-12-20 ENCOUNTER — Ambulatory Visit: Payer: Self-pay

## 2024-12-20 ENCOUNTER — Telehealth (INDEPENDENT_AMBULATORY_CARE_PROVIDER_SITE_OTHER): Payer: Self-pay | Admitting: Physician Assistant

## 2024-12-20 DIAGNOSIS — I639 Cerebral infarction, unspecified: Secondary | ICD-10-CM

## 2024-12-20 LAB — CUP PACEART REMOTE DEVICE CHECK
Date Time Interrogation Session: 20260106233744
Implantable Pulse Generator Implant Date: 20250804

## 2024-12-20 NOTE — Telephone Encounter (Signed)
 Patient called and would like a call back regarding CT scan results for Parotid gland.  He has been waiting for a call.  He would like to know next steps and if he needs to schedule a follow up appt.  Patient is a former Dr. Okey patient.  Patient stated he has left messages and has not received a call back.  Please call him at [phone].

## 2024-12-20 NOTE — Telephone Encounter (Signed)
 Patient has been scheduled with Dr. Anice on 01/22/2025.

## 2024-12-20 NOTE — Telephone Encounter (Signed)
 FYI Only or Action Required?: Action required by provider: rquest to change amlodipine .  Patient was last seen in primary care on 12/04/2024 by Harrie Bruckner, DO.  Called Nurse Triage reporting Dizziness.  Symptoms began 2 weeks ago.  Interventions attempted: Nothing.  Symptoms are: stable.  Triage Disposition: See PCP When Office is Open (Within 3 Days)  Patient/caregiver understands and will follow disposition?:     Copied from CRM #[id number]. Topic: Clinical - Red Word Triage >> Dec 20, 2024 10:44 AM Mercer PEDLAR wrote: Red Word that prompted transfer to Nurse Triage: Patient stated that he is having side effects of amLODipine  (NORVASC ) 5 MG tablet. Dizziness and headaches. Reason for Disposition  [1] MILD dizziness (e.g., walking normally) AND [2] has NOT been evaluated by doctor (or NP/PA) for this  (Exception: Dizziness caused by heat exposure, sudden standing, or poor fluid intake.)  Answer Assessment - Initial Assessment Questions Requesting message go to Dr. Harrie Pt having HA, constipation, dizziness since starting amlodipine . Says has family members that have had the same reaction to this med. Will not take tomorrow. Requesting replacement to start when he gets back in town on Friday that will not cause erectile dysfunction. BP this am 140/90   1. DESCRIPTION: Describe your dizziness.     lightheaded 2. LIGHTHEADED: Do you feel lightheaded? (e.g., somewhat faint, woozy, weak upon standing)     With changing positions 3. VERTIGO: Do you feel like either you or the room is spinning or tilting? (i.e., vertigo)      4. SEVERITY: How bad is it?  Do you feel like you are going to faint? Can you stand and walk?     Is walking normally 5. ONSET:  When did the dizziness begin?     After starting amlodipine  6. AGGRAVATING FACTORS: Does anything make it worse? (e.g., standing, change in head position)     Changing position 7. HEART RATE: Can you tell me your  heart rate? How many beats in 15 seconds?  (Note: Not all patients can do this.)       HR normally 8. CAUSE: What do you think is causing the dizziness? (e.g., decreased fluids or food, diarrhea, emotional distress, heat exposure, new medicine, sudden standing, vomiting; unknown)      9. RECURRENT SYMPTOM: Have you had dizziness before? If Yes, ask: When was the last time? What happened that time?     no  Protocols used: Dizziness - Lightheadedness-A-AH

## 2024-12-21 ENCOUNTER — Encounter

## 2024-12-21 NOTE — Progress Notes (Signed)
 Remote Loop Recorder Transmission

## 2024-12-23 ENCOUNTER — Ambulatory Visit: Payer: Self-pay | Admitting: Cardiology

## 2024-12-25 ENCOUNTER — Encounter

## 2024-12-27 ENCOUNTER — Telehealth: Admitting: Student

## 2024-12-27 DIAGNOSIS — R519 Headache, unspecified: Secondary | ICD-10-CM

## 2024-12-27 DIAGNOSIS — I10 Essential (primary) hypertension: Secondary | ICD-10-CM | POA: Diagnosis not present

## 2024-12-27 DIAGNOSIS — G44209 Tension-type headache, unspecified, not intractable: Secondary | ICD-10-CM | POA: Insufficient documentation

## 2024-12-27 MED ORDER — CHLORTHALIDONE 25 MG PO TABS: 25.0000 mg | ORAL_TABLET | Freq: Every day | ORAL | 2 refills | Status: AC

## 2024-12-27 NOTE — Assessment & Plan Note (Signed)
 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 Initially had dizzy spells that has resolved. Patient attributes his headaches to starting amlodipine ; states that he stopped taking the medication last week on Wednesday 1/7; though he is still having the headaches. He has tried OTC goody powders and motrin  with some relief, and has bough Excedrin today to try. Reports his BP 170/100 this morning and has been elevated this past week. Patient reports that he has f/u appointment on upcoming Monday. Patient is notified that unlikely the amlodipine  is resulting the HA, likely from uncontrolled HTN, but he can stop the amlodipine  and restart his home medication Chlorthalidone   - Continue losartan  100 mg daily - Resumed Chlorthalidone  25 mg daily  - RTC on Monday for BP evaluation - For HA > advised to take Excedrin with alternating motrin  (max dose 1200 mg in 24 hours), hydration and avoiding triggers

## 2024-12-27 NOTE — Progress Notes (Signed)
" ° °  CC: headaches   This is a telephone encounter between Roy [name] and Roy [name] on 12/27/2024 for headahces. The visit was conducted with the patient located at [facility] and [employer] at [facility]. The patient's identity was confirmed using their DOB and current address. The patient has consented to being evaluated through a telephone encounter and understands the associated risks (an examination cannot be done and the patient may need to come in for an appointment) / benefits (allows the patient to remain at home, decreasing exposure to coronavirus). I personally spent 30 minutes on medical discussion.   HPI:  [name] is a 54 y.o. with PMH HTN and prior CVA  on ASA and Statin, prediabetes presents today for a video visit.   Please see A&P for assessment of the patient's acute and chronic medical conditions.   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  Denies vision changes, nausea, vomiting, abdominal pain.  Denies chest pain or shortness of breath.   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 Initially had dizzy spells that has resolved. Patient attributes his headaches to starting amlodipine ; states that he stopped taking the medication last week on Wednesday 1/7; though he is still having the headaches. He has tried OTC goody powders and motrin  with some relief, and has bough Excedrin today to try. Reports his BP 170/100 this morning and has been elevated this past week. Patient  reports that he has f/u appointment on upcoming Monday. Patient is notified that unlikely the amlodipine  is resulting the HA, likely from uncontrolled HTN, but he can stop the amlodipine  and restart his home medication Chlorthalidone   - Continue losartan  100 mg daily - Resumed Chlorthalidone  25 mg daily  - RTC on Monday for BP evaluation - For HA > advised to take Excedrin with alternating motrin  (max dose 1200 mg in 24 hours), hydration and avoiding triggers     Patient discussed with Dr. CHARLENA Rosan Roy Edwards, DO Internal Medicine Resident 12/27/2024  "

## 2024-12-27 NOTE — Telephone Encounter (Signed)
 RTC from patient .  Continued to have dizziness and Headaches from the Amlodipine .  Stated is still taking the Losartan .  B/P was 170/100 stopped taking the Amlodipine  last Wednesday.  HA's have returned .  Would like to know what he needs to do.  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  Was given an appointment for 01/01/2025.  Is there anything else he will need to do.

## 2024-12-28 ENCOUNTER — Telehealth: Payer: Self-pay

## 2024-12-28 ENCOUNTER — Other Ambulatory Visit: Payer: Self-pay

## 2024-12-28 DIAGNOSIS — Z1211 Encounter for screening for malignant neoplasm of colon: Secondary | ICD-10-CM

## 2024-12-28 MED ORDER — NA SULFATE-K SULFATE-MG SULF 17.5-3.13-1.6 GM/177ML PO SOLN: 1.0000 | Freq: Once | ORAL | 0 refills | Status: AC

## 2024-12-28 NOTE — Telephone Encounter (Signed)
 Gastroenterology Pre-Procedure Review  Request Date: 01/22/25 Requesting Physician: Dr. Theophilus  PATIENT REVIEW QUESTIONS: The patient responded to the following health history questions as indicated:    1. Are you having any GI issues? no 2. Do you have a personal history of Polyps? no 3. Do you have a family history of Colon Cancer or Polyps? no 4. Diabetes Mellitus? no 5. Joint replacements in the past 12 months?no 6. Major health problems in the past 3 months?no 7. Any artificial heart valves, MVP, or defibrillator?Loop Recorder Cardiac Clearance sent to [facility]    MEDICATIONS & ALLERGIES:    Patient reports the following regarding taking any anticoagulation/antiplatelet therapy:   Plavix , Coumadin, Eliquis, Xarelto, Lovenox , Pradaxa, Brilinta, or Effient? no Aspirin ? no  Patient confirms/reports the following medications:  Current Outpatient Medications  Medication Sig Dispense Refill   aspirin  EC 81 MG tablet Take 1 tablet (81 mg total) by mouth daily. Swallow whole. 30 tablet 1   chlorthalidone  (HYGROTON ) 25 MG tablet Take 1 tablet (25 mg total) by mouth daily. 90 tablet 2   losartan  (COZAAR ) 100 MG tablet Take 1 tablet (100 mg total) by mouth daily. 90 tablet 3   acetaminophen  (TYLENOL ) 500 MG tablet Take 2 tablets (1,000 mg total) by mouth every 6 (six) hours as needed for mild pain or fever. (Patient not taking: Reported on 12/28/2024) 30 tablet 0   albuterol  (VENTOLIN  HFA) 108 (90 Base) MCG/ACT inhaler Inhale 2 puffs into the lungs every 6 (six) hours as needed for wheezing or shortness of breath. (Patient not taking: Reported on 12/28/2024) 8 g 2   amLODipine  (NORVASC ) 5 MG tablet Take 1 tablet (5 mg total) by mouth daily. (Patient not taking: Reported on 12/28/2024) 30 tablet 3   atorvastatin  (LIPITOR) 80 MG tablet Take 1 tablet (80 mg total) by mouth at bedtime. 30 tablet 1   [Paused] buPROPion (WELLBUTRIN SR) 150 MG 12 hr tablet Take 150 mg by mouth 2 (two) times daily  as needed (anxiety, relaxation).     levofloxacin  (LEVAQUIN ) 750 MG tablet Take 1 tablet (750 mg total) by mouth daily. (Patient not taking: Reported on 12/28/2024) 3 tablet 0   pregabalin (LYRICA) 75 MG capsule Take 75-150 mg by mouth daily as needed (pain). (Patient not taking: Reported on 12/28/2024)     SPIRIVA HANDIHALER 18 MCG inhalation capsule Place 18 mcg into inhaler and inhale daily as needed (wheezing). (Patient not taking: Reported on 12/28/2024)     SYMBICORT  160-4.5 MCG/ACT inhaler Inhale 2 puffs into the lungs in the morning and at bedtime. (Patient not taking: Reported on 12/28/2024)     [Paused] VIAGRA 100 MG tablet Take 100 mg by mouth as needed for erectile dysfunction.     No current facility-administered medications for this visit.    Patient confirms/reports the following allergies:  Allergies[1]  No orders of the defined types were placed in this encounter.   AUTHORIZATION INFORMATION Primary Insurance: 1D#: Group #:  Secondary Insurance: 1D#: Group #:  SCHEDULE INFORMATION: Date: 01/22/25 Time: Location: [facility]    [1] No Known Allergies

## 2024-12-29 NOTE — Telephone Encounter (Signed)
 [facility] to [facility] in [place].  They noted that patient follows [facility].  Clearance request faxed to [facility] Celene Mliss Kerns, NP.  Thanks,  [place], CMA.

## 2025-01-01 ENCOUNTER — Ambulatory Visit: Payer: Self-pay

## 2025-01-01 ENCOUNTER — Other Ambulatory Visit: Payer: Self-pay

## 2025-01-01 VITALS — BP 140/82 | HR 64 | Temp 98.1°F | Ht 75.0 in | Wt 261.6 lb

## 2025-01-01 DIAGNOSIS — G44209 Tension-type headache, unspecified, not intractable: Secondary | ICD-10-CM | POA: Diagnosis not present

## 2025-01-01 DIAGNOSIS — I10 Essential (primary) hypertension: Secondary | ICD-10-CM

## 2025-01-01 DIAGNOSIS — F17200 Nicotine dependence, unspecified, uncomplicated: Secondary | ICD-10-CM

## 2025-01-01 DIAGNOSIS — F17290 Nicotine dependence, other tobacco product, uncomplicated: Secondary | ICD-10-CM | POA: Diagnosis not present

## 2025-01-01 MED ORDER — NICOTINE 7 MG/24HR TD PT24: 7.0000 mg | MEDICATED_PATCH | TRANSDERMAL | 3 refills | Status: DC

## 2025-01-01 NOTE — Patient Instructions (Addendum)
 It was wonderful seeing you today!   1) For your blood pressure, take losartan , chlorthalidone , and hydralazine . I've placed a lab order for you to come back on Monday and get labs.   2) For your headache, I recommend you get your CT of your head as soon as possible. If you have any neurological symptoms, go to the ED right away.  - I recommend that you take Tylenol  (no more than 3000 mg a day) and Aleve (500 mg every 12 hours) as needed for headache pain.   3) I sent the nicotine  patches to your pharmacy  If you have any questions please feel free to the call the clinic at anytime at [phone].  Have a blessed day,  Dr. Charmayne

## 2025-01-01 NOTE — Assessment & Plan Note (Signed)
 See above for more history.  He seemed to notice the headaches first after the fall in [place] which was just after he started amlodipine .  The headaches are bilateral and on top of his head and they feel like a band around his head.  The headaches come and go throughout the day, they are not worse in the morning.  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  The lights in the encounter room and in buildings does not make the headaches worse.  He denies any auras that precede the headache.  He denies any acute neurologic symptoms.  He says that they have been similar in intensity, they were worse last week when he was having a particularly busy week and was driving a lot, so much so that he had to pull over and let it pass for about 2 hours.  He says they have been better over the last couple of days but still coming and going throughout the day, usually lasting an hour or 2.  He has been taking Aleve and Excedrin which helps some.  No neurological findings on exam.  Given his history of stroke in 07/2024, difficult to control hypertension, and persistent nonspecific headache, recommended patient go to the emergency department to have a CT scan.  He is unable to go today but will go within the week.  Gave strict instructions for symptoms that require him to go to the emergency department.  Recommend he stop taking Excedrin and to continue acetaminophen  and naproxen. Plan CT head without contrast Acetaminophen  1000 mg every 8 hours as needed Naproxen 500 mg every 12 hours as needed Orders:   CT HEAD WO CONTRAST ( ); Future

## 2025-01-01 NOTE — Telephone Encounter (Signed)
 Yes, we can take care of that. Odd it was sent to you?   Geni, can you please print out clearance form to be completed. Thank you!

## 2025-01-01 NOTE — Telephone Encounter (Signed)
 Form printed and placed in your box to be signed

## 2025-01-01 NOTE — Progress Notes (Signed)
 "  Established Patient Office Visit  Subjective   Patient ID: Diesel L Mcgilvray, male    DOB: [birth year]  Age: 54 y.o. MRN: [id number]  Patient here for hypertension and headache follow-up.  See below for medical history, problem based assessment and plan.    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  Constitutional:      Appearance: He is not toxic-appearing.  Eyes:     Extraocular Movements: Extraocular movements intact.  Cardiovascular:     Rate and Rhythm: Normal rate and regular rhythm.     Heart sounds: Normal heart sounds.  Pulmonary:     Effort: Pulmonary effort is normal.     Breath sounds: Normal breath sounds.  Musculoskeletal:     Cervical back: Normal range of motion.  Skin:    General: Skin is warm.  Neurological:     Mental Status: He is alert.     Cranial Nerves: No dysarthria or facial asymmetry.      No results found for any visits on 01/01/25.  Last CBC Lab Results  Component Value Date   WBC 5.7 08/21/2024   HGB 11.7 (L) 08/21/2024   HCT 35.4 (L) 08/21/2024   MCV 103 (H) 08/21/2024   MCH 34.0 (H) 08/21/2024   RDW 13.3 08/21/2024   PLT 328 08/21/2024   Last metabolic panel Lab Results  Component Value Date   GLUCOSE 96 07/24/2024   NA 142 07/24/2024   K 4.2  07/24/2024   CL 105 07/24/2024   CO2 23 07/24/2024   BUN 24 07/24/2024   CREATININE 1.08 07/24/2024   EGFR 82 07/24/2024   CALCIUM  9.4 07/24/2024   PHOS 2.4 (L) 08/17/2018   PROT 6.6 07/12/2024   ALBUMIN 3.4 (L) 07/12/2024   BILITOT 1.2 07/12/2024   ALKPHOS 44 07/12/2024   AST 24 07/12/2024   ALT 18 07/12/2024   ANIONGAP 9 07/17/2024      The ASCVD Risk score (Arnett DK, et al., 2019) failed to calculate for the following reasons:   Risk score cannot be calculated because patient has a medical history suggesting prior/existing ASCVD   * - Cholesterol units were assumed    Assessment & Plan:   Assessment & Plan Essential hypertension LOV 11/2024 patient started amlodipine  5 in addition to losartan  100 mg daily.  Patient then went on a birthday trip to [place] and had a fall where he hit his head.  He started having frequent headaches causing him to worry about a brain bleed and he went to a hospital in [place]  where he was  at the time and had a CT that was unremarkable.  He attributes the timing of his headaches to starting amlodipine .  He tried the amlodipine  for 3 weeks to see if it would go away, but it did not and he stopped the amlodipine  and has been off it for at least 2 weeks now.  He had a telemedicine visit for the headaches on 1/14 and was started on chlorthalidone  25 mg daily.  He also had some leftover hydralazine  from her previous prescription and he took that today as well.  BP 140/82 today, not at goal.  He has been reassured that the amlodipine  was likely not the cause of his headache although he still attributes it to this.  Also discussed that the hydralazine  may be making his headaches worse.  Discussed options for him, he prefers not to do carvedilol  due to concerns for ED.  Will try to initiate spironolactone  and if unable will do hydralazine .  BMP in December K was 4 and creatinine 0.99 and GFR 90.  He has been taking Excedrin for headaches, will check a BMP before  starting spironolactone .  I told him to continue taking his leftover hydralazine  and I will call him after I get labs back to give him final plan recommendations.  If GFR greater than 30 and potassium less than 4.5 will start spironolactone  12.5 mg daily with plans to titrate as tolerated. Plan Continue losartan  100 mg daily  Stop amlodipine  5 mg daily Hydralazine  25 mg twice daily until lab results Pending hydralazine  versus spironolactone  pending labs Goal less than 130/80 for secondary prevention Orders:   Basic metabolic panel with GFR; Future  Tension headache See above for more history.  He seemed to notice the headaches first after the fall in [place] which was just after he started amlodipine .  The headaches are bilateral and on top of his head and they feel like a band around his head.  The headaches come and go throughout the day, they are not worse in the morning.  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  The lights in the encounter room and in buildings does not make the headaches worse.  He denies any auras that precede the headache.  He denies any acute neurologic symptoms.  He says that they have been similar in intensity, they were worse last week when he was having a particularly busy week and was driving a lot, so much so that he had to pull over and let it pass for about 2 hours.  He says they have been better over the last couple of days but still coming and going throughout the day, usually lasting an hour or 2.  He has been taking Aleve and Excedrin which helps some.  No neurological findings on exam.  Given his history of stroke in 07/2024, difficult to control hypertension, and persistent nonspecific headache, recommended patient go to the emergency department to have a CT scan.  He is unable to go today but will go within the week.  Gave strict instructions for symptoms that require him to go to the emergency department.   Recommend he stop taking Excedrin and to continue acetaminophen  and naproxen. Plan CT head without contrast Acetaminophen  1000 mg every 8 hours as needed Naproxen 500 mg every 12 hours as needed Orders:   CT HEAD WO CONTRAST ( ); Future  Tobacco use disorder He stopped smoking cigarettes but transitioned to vaping.  He was originally using the vapes that did not have any tobacco but now he is using vapes with tobacco.  Discussed how vaping has similar deleterious effects as smoking cigarettes.  He has tried varenicline in the past but it gave him abnormal dreams, has previously been prescribed bupropion although not taking.  Given his hypertension, I do not recommend resuming this.  He is interested in trying nicotine  patches.  I emphasized the importance of stopping smoking in order to control his blood pressure and prevent recurrence of stroke.   Plan Nicotine  patches     Return in about 4 weeks (around 01/29/2025) for BP follow up .    Viktoria King, DO    [1]  Social History Tobacco Use   Smoking status: Former    Types: Cigarettes   Smokeless tobacco: Current   Tobacco comments:    Vape  Vaping Use   Vaping status: Some Days  Substance Use Topics   Drug use: Not Currently    Types: Marijuana   "

## 2025-01-02 ENCOUNTER — Ambulatory Visit: Payer: Self-pay

## 2025-01-02 LAB — BASIC METABOLIC PANEL WITH GFR
BUN/Creatinine Ratio: 25 — ABNORMAL HIGH (ref 9–20)
BUN: 29 mg/dL — ABNORMAL HIGH (ref 6–24)
CO2: 22 mmol/L (ref 20–29)
Calcium: 9.9 mg/dL (ref 8.7–10.2)
Chloride: 102 mmol/L (ref 96–106)
Creatinine, Ser: 1.15 mg/dL (ref 0.76–1.27)
Glucose: 102 mg/dL — ABNORMAL HIGH (ref 70–99)
Potassium: 4.2 mmol/L (ref 3.5–5.2)
Sodium: 139 mmol/L (ref 134–144)
eGFR: 75 mL/min/1.73

## 2025-01-02 MED ORDER — SPIRONOLACTONE 25 MG PO TABS: 12.5000 mg | ORAL_TABLET | Freq: Every day | ORAL | 3 refills | Status: AC

## 2025-01-03 NOTE — Progress Notes (Signed)
 Internal Medicine Clinic Attending  Case discussed with the resident at the time of the visit.  We reviewed the resident's history and exam and pertinent patient test results.  I agree with the assessment, diagnosis, and plan of care documented in the resident's note.

## 2025-01-04 ENCOUNTER — Telehealth: Payer: Self-pay | Admitting: *Deleted

## 2025-01-04 NOTE — Telephone Encounter (Signed)
 Will forward to C. Boone.                    Copied from CRM #[id number]. Topic: Clinical - Request for Lab/Test Order >> Jan 04, 2025  2:23 PM Alfonso ORN wrote: Reason for CRM: patient called regarding the order for the CT head WO contrast following up if proper paperwork  sent and the pre authorization for the order

## 2025-01-05 ENCOUNTER — Telehealth: Payer: Self-pay | Admitting: *Deleted

## 2025-01-05 NOTE — Telephone Encounter (Signed)
 Neurology clearance has been granted from Dr. Rex per fax received on 01/01/25.  Letter has been scanned to chart.   Cardiac clearance is still pending.  Thanks,  [place], CMA

## 2025-01-05 NOTE — Progress Notes (Signed)
 Internal Medicine Clinic Attending  Case discussed with the resident at the time of the visit.  We reviewed the resident's history and exam and pertinent patient test results.  I agree with the assessment, diagnosis, and plan of care documented in the resident's note.

## 2025-01-05 NOTE — Telephone Encounter (Signed)
 CT Scan  Order ID: [id number]       Authorized   Approval Valid Through: 01/05/2025 - 04/04/2025  Copied from CRM #[id number]. Topic: General - Other >> Jan 05, 2025 11:15 AM Roy [name] ORN wrote: Reason for CRM: patient was speaking with someone , checking on prior authorization for ct scan pt have an upcoming ct scan schedule for 01/08/25 ,  Pt want to make sure everything is in place when go to his ct scan appt

## 2025-01-05 NOTE — Telephone Encounter (Signed)
 Will forward to C. Boone.                    Copied from CRM #[id number]. Topic: Clinical - Request for Lab/Test Order >> Jan 04, 2025  2:23 PM Alfonso ORN wrote: Reason for CRM: patient called regarding the order for the CT head WO contrast following up if proper paperwork  sent and the pre authorization for the order >> Jan 05, 2025 11:09 AM DeAngela L wrote: Patient calling back to check the status of authorization paper work being sent over for the patient to get a ct scan scheduled for Monday     Call dropped could not update the patient or cal the office

## 2025-01-08 ENCOUNTER — Ambulatory Visit: Admission: RE | Admit: 2025-01-08 | Source: Ambulatory Visit

## 2025-01-09 ENCOUNTER — Telehealth: Payer: Self-pay | Admitting: *Deleted

## 2025-01-09 ENCOUNTER — Encounter (HOSPITAL_COMMUNITY): Payer: Self-pay

## 2025-01-09 ENCOUNTER — Ambulatory Visit
Admission: RE | Admit: 2025-01-09 | Discharge: 2025-01-09 | Disposition: A | Discharge status: Home / Self Care | Source: Ambulatory Visit | Attending: Internal Medicine | Admitting: Internal Medicine

## 2025-01-09 ENCOUNTER — Inpatient Hospital Stay (HOSPITAL_COMMUNITY)
Admission: EM | Admit: 2025-01-09 | Discharge: 2025-01-12 | DRG: 025 | Disposition: A | Discharge status: Home / Self Care

## 2025-01-09 ENCOUNTER — Other Ambulatory Visit: Payer: Self-pay

## 2025-01-09 DIAGNOSIS — J189 Pneumonia, unspecified organism: Secondary | ICD-10-CM | POA: Diagnosis not present

## 2025-01-09 DIAGNOSIS — W1830XA Fall on same level, unspecified, initial encounter: Secondary | ICD-10-CM | POA: Diagnosis present

## 2025-01-09 DIAGNOSIS — Z801 Family history of malignant neoplasm of trachea, bronchus and lung: Secondary | ICD-10-CM

## 2025-01-09 DIAGNOSIS — S065XAA Traumatic subdural hemorrhage with loss of consciousness status unknown, initial encounter: Secondary | ICD-10-CM | POA: Diagnosis present

## 2025-01-09 DIAGNOSIS — G44311 Acute post-traumatic headache, intractable: Secondary | ICD-10-CM | POA: Diagnosis not present

## 2025-01-09 DIAGNOSIS — I6200 Nontraumatic subdural hemorrhage, unspecified: Secondary | ICD-10-CM | POA: Diagnosis not present

## 2025-01-09 DIAGNOSIS — J45909 Unspecified asthma, uncomplicated: Secondary | ICD-10-CM | POA: Diagnosis present

## 2025-01-09 DIAGNOSIS — Z8679 Personal history of other diseases of the circulatory system: Secondary | ICD-10-CM

## 2025-01-09 DIAGNOSIS — Z808 Family history of malignant neoplasm of other organs or systems: Secondary | ICD-10-CM

## 2025-01-09 DIAGNOSIS — M792 Neuralgia and neuritis, unspecified: Secondary | ICD-10-CM | POA: Insufficient documentation

## 2025-01-09 DIAGNOSIS — G44209 Tension-type headache, unspecified, not intractable: Secondary | ICD-10-CM | POA: Insufficient documentation

## 2025-01-09 DIAGNOSIS — F17290 Nicotine dependence, other tobacco product, uncomplicated: Secondary | ICD-10-CM | POA: Diagnosis present

## 2025-01-09 DIAGNOSIS — D649 Anemia, unspecified: Secondary | ICD-10-CM | POA: Diagnosis not present

## 2025-01-09 DIAGNOSIS — Z48811 Encounter for surgical aftercare following surgery on the nervous system: Secondary | ICD-10-CM | POA: Diagnosis not present

## 2025-01-09 DIAGNOSIS — G8929 Other chronic pain: Secondary | ICD-10-CM | POA: Diagnosis present

## 2025-01-09 DIAGNOSIS — F419 Anxiety disorder, unspecified: Secondary | ICD-10-CM | POA: Diagnosis present

## 2025-01-09 DIAGNOSIS — Z7951 Long term (current) use of inhaled steroids: Secondary | ICD-10-CM | POA: Diagnosis not present

## 2025-01-09 DIAGNOSIS — Z7982 Long term (current) use of aspirin: Secondary | ICD-10-CM | POA: Diagnosis not present

## 2025-01-09 DIAGNOSIS — Z79899 Other long term (current) drug therapy: Secondary | ICD-10-CM | POA: Diagnosis not present

## 2025-01-09 DIAGNOSIS — R7303 Prediabetes: Secondary | ICD-10-CM | POA: Diagnosis present

## 2025-01-09 DIAGNOSIS — I10 Essential (primary) hypertension: Secondary | ICD-10-CM | POA: Diagnosis present

## 2025-01-09 DIAGNOSIS — G935 Compression of brain: Secondary | ICD-10-CM | POA: Diagnosis present

## 2025-01-09 DIAGNOSIS — E785 Hyperlipidemia, unspecified: Secondary | ICD-10-CM | POA: Diagnosis present

## 2025-01-09 DIAGNOSIS — Z87891 Personal history of nicotine dependence: Secondary | ICD-10-CM | POA: Diagnosis not present

## 2025-01-09 DIAGNOSIS — I429 Cardiomyopathy, unspecified: Secondary | ICD-10-CM | POA: Diagnosis present

## 2025-01-09 DIAGNOSIS — W19XXXA Unspecified fall, initial encounter: Secondary | ICD-10-CM | POA: Diagnosis not present

## 2025-01-09 DIAGNOSIS — R519 Headache, unspecified: Secondary | ICD-10-CM | POA: Diagnosis present

## 2025-01-09 LAB — CBC
HCT: 37.7 % — ABNORMAL LOW (ref 39.0–52.0)
Hemoglobin: 12.7 g/dL — ABNORMAL LOW (ref 13.0–17.0)
MCH: 33.1 pg (ref 26.0–34.0)
MCHC: 33.7 g/dL (ref 30.0–36.0)
MCV: 98.2 fL (ref 80.0–100.0)
Platelets: 326 10*3/uL (ref 150–400)
RBC: 3.84 MIL/uL — ABNORMAL LOW (ref 4.22–5.81)
RDW: 12.9 % (ref 11.5–15.5)
WBC: 5.2 10*3/uL (ref 4.0–10.5)
nRBC: 0 % (ref 0.0–0.2)

## 2025-01-09 LAB — BASIC METABOLIC PANEL WITH GFR
Anion gap: 12 (ref 5–15)
BUN: 26 mg/dL — ABNORMAL HIGH (ref 6–20)
CO2: 26 mmol/L (ref 22–32)
Calcium: 9.8 mg/dL (ref 8.9–10.3)
Chloride: 101 mmol/L (ref 98–111)
Creatinine, Ser: 1.12 mg/dL (ref 0.61–1.24)
GFR, Estimated: >60 mL/min
Glucose, Bld: 98 mg/dL (ref 70–99)
Potassium: 4.1 mmol/L (ref 3.5–5.1)
Sodium: 139 mmol/L (ref 135–145)

## 2025-01-09 MED ORDER — POLYETHYLENE GLYCOL 3350 17 G PO PACK: 17.0000 g | PACK | Freq: Every day | ORAL | Status: DC | PRN

## 2025-01-09 MED ORDER — LABETALOL HCL 5 MG/ML IV SOLN: 5.0000 mg | INTRAVENOUS | Status: DC | PRN

## 2025-01-09 MED ORDER — ALBUTEROL SULFATE (2.5 MG/3ML) 0.083% IN NEBU: 2.5000 mg | INHALATION_SOLUTION | Freq: Four times a day (QID) | RESPIRATORY_TRACT | Status: DC | PRN

## 2025-01-09 MED ORDER — CHLORTHALIDONE 25 MG PO TABS: 25.0000 mg | ORAL_TABLET | Freq: Every day | ORAL | Status: DC

## 2025-01-09 MED ORDER — FLUTICASONE FUROATE-VILANTEROL 100-25 MCG/ACT IN AEPB
1.0000 | INHALATION_SPRAY | Freq: Every day | RESPIRATORY_TRACT | Status: DC
  Administered 2025-01-10 – 2025-01-12 (×3): 1 via RESPIRATORY_TRACT
  Filled 2025-01-09 (×2): qty 28

## 2025-01-09 MED ORDER — ACETAMINOPHEN 500 MG PO TABS
1000.0000 mg | ORAL_TABLET | Freq: Three times a day (TID) | ORAL | Status: DC
  Administered 2025-01-09 – 2025-01-12 (×6): 1000 mg via ORAL
  Filled 2025-01-09 (×6): qty 2

## 2025-01-09 MED ORDER — ATORVASTATIN CALCIUM 80 MG PO TABS
80.0000 mg | ORAL_TABLET | Freq: Every day | ORAL | Status: DC
  Administered 2025-01-09 – 2025-01-11 (×3): 80 mg via ORAL
  Filled 2025-01-09 (×3): qty 1

## 2025-01-09 MED ORDER — MUPIROCIN 2 % EX OINT
1.0000 | TOPICAL_OINTMENT | Freq: Two times a day (BID) | CUTANEOUS | Status: DC
  Administered 2025-01-09 – 2025-01-11 (×2): 1 via NASAL
  Filled 2025-01-09 (×2): qty 22

## 2025-01-09 MED ORDER — LOSARTAN POTASSIUM 50 MG PO TABS: 100.0000 mg | ORAL_TABLET | Freq: Every day | ORAL | Status: DC

## 2025-01-09 MED ORDER — OXYCODONE HCL 5 MG PO TABS
5.0000 mg | ORAL_TABLET | Freq: Once | ORAL | Status: AC
  Administered 2025-01-09: 5 mg via ORAL
  Filled 2025-01-09: qty 1

## 2025-01-09 MED ORDER — PREGABALIN 75 MG PO CAPS
150.0000 mg | ORAL_CAPSULE | Freq: Two times a day (BID) | ORAL | Status: DC | PRN
  Administered 2025-01-10: 150 mg via ORAL
  Filled 2025-01-09: qty 2

## 2025-01-09 NOTE — ED Triage Notes (Signed)
 Patient had a fall 1 month ago but has been battling headaches and HTN and has had medication changes to try and help.  Patient has bilateral subdurals and 7mm shift on head CT today.  Still compl;ains of headache.

## 2025-01-09 NOTE — Plan of Care (Signed)
 Received from ED at 1744 accompanied by transporter. Orientation to new environment done.  Problem: Education: Goal: Knowledge of General Education information will improve Description: Including pain rating scale, medication(s)/side effects and non-pharmacologic comfort measures Outcome: Progressing   Problem: Clinical Measurements: Goal: Will remain free from infection Outcome: Progressing   Problem: Clinical Measurements: Goal: Ability to maintain clinical measurements within normal limits will improve Outcome: Progressing   Problem: Clinical Measurements: Goal: Diagnostic test results will improve Outcome: Progressing   Problem: Elimination: Goal: Will not experience complications related to bowel motility Outcome: Progressing   Problem: Pain Managment: Goal: General experience of comfort will improve and/or be controlled Outcome: Progressing   Problem: Elimination: Goal: Will not experience complications related to urinary retention Outcome: Progressing

## 2025-01-09 NOTE — Telephone Encounter (Signed)
 Call from [facility] from Radiology with critical results of patient's CT.  Spoke with Dr. Shona from Radiology about the results and need for immediate follow up. Call to Dr. Lovie message was  left to call the clinics.  Dr. Shona can be reached at [phone] for details if needed.  Spoke with Dr. Charmayne who is going to call patient with follow up.

## 2025-01-09 NOTE — ED Notes (Signed)
Pt given turkey sandwich and gingerale. 

## 2025-01-09 NOTE — H&P (Cosign Needed Addendum)
 " Date: 01/09/2025               Patient Name:  Roy [name] MRN: [id number]  DOB: [birth year] Age / Sex: 54 y.o., male   PCP: Benuel Braun, DO         Medical Service: Internal Medicine Teaching Service         Attending Physician: Dr. MICAEL Riis Winfrey      First Contact: Dr. Alfornia Light, DO    Second Contact: Dr. Missy Sandhoff, MD         Pager Information: First Contact Pager: [phone]   Second Contact Pager: [phone]   SUBJECTIVE   Chief Complaint: headache  History of Present Illness: Rhyland L Cienfuegos is a 54 y.o. male with PMH of CVA, HTN who presented to the ED after being told by his PCP his head imaging revealed bilateral subdural hematomas.  Patient says he fell about 6 weeks ago, and has been having headaches since.  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  Patient said his head CT in [place]  was negative for acute intracranial abnormality, so patient has been going about his normal routine.  Patient says he was still continuing to have headaches, so his PCP added amlodipine  to his antihypertensive regimen, though this seemed to make his headaches worse so this was discontinued.  Patient endorses that sometimes these headaches are worse at night, though he believes that might be related to the headlights shining in his eyes while he is driving. Patient says he continued to have persistent, fluctuating in intensity.patient mentions that occasionally these headaches get intolerable, and can be the worst pain he is imagine, though can also just be a dull ache.  PCP reimaged his head, revealing bilateral subdural hematomas with 7 mm rightward midline shift, informed patient who subsequently presented to the ED.  Patient denies any new head traumas since the initial fall 6 weeks ago, and denies any other symptoms, has felt completely fine apart from persistent headache.   Patient denies any new neurologic sensory deficits.  Discussed with patient plan to proceed with possible surgery tomorrow to relieve intracranial pressure, voiced understanding has no questions or concerns at this time.  ED Course: Labs without acute abnormalities, Hgb 12.7 Imaging: CT head with left-sided subdural hematoma up to 10 mL in thickness, also a right subdural hematoma of 5 mm resulting in intracranial mass effect with down worse rightward midline shift of 7 mm Consulted NSG, IMTS  Meds:  Patient reported:  ASA 81 mg daily Spironolactone  12.5 mg daily Albuterol  inhaler every 6 hours as needed Lipitor  80 mg daily Hydrochlorothiazide  25 mg daily Symbicort  2 times daily Losartan  100 mg daily Lyrica  150 mg 2 times daily as needed  Active Medications[1]  Past Medical History CVA 07/2024 HTN  Past Surgical History Past Surgical History:  Procedure Laterality Date   ANEURYSM COILING     a few years ago   DECORTICATION Right 09/02/2018   Procedure: DECORTICATION;  Surgeon: Kerrin Elspeth BROCKS, MD;  Location: [facility] OR;  Service: Thoracic;  Laterality: Right;   LOOP RECORDER INSERTION N/A 07/17/2024   Procedure: LOOP RECORDER INSERTION;  Surgeon: Lesia Ozell Barter, PA-C;  Location: [facility] INVASIVE CV LAB;  Service: Cardiovascular;  Laterality: N/A;   PLEURAL EFFUSION DRAINAGE Right 09/02/2018   Procedure: DRAINAGE OF PLEURAL EFFUSION;  Surgeon: Kerrin Elspeth BROCKS, MD;  Location: [facility]  OR;  Service: Thoracic;  Laterality: Right;   TRANSESOPHAGEAL ECHOCARDIOGRAM (CATH LAB) N/A 07/17/2024   Procedure: TRANSESOPHAGEAL ECHOCARDIOGRAM;  Surgeon: Lonni Slain, MD;  Location: [facility] INVASIVE CV LAB;  Service: Cardiovascular;  Laterality: N/A;   VIDEO ASSISTED THORACOSCOPY (VATS)/THOROCOTOMY Right 09/02/2018   Procedure: VIDEO ASSISTED THORACOSCOPY (VATS)/possible THOROCOTOMY;  Surgeon: Kerrin Elspeth BROCKS, MD;  Location: [facility] OR;  Service: Thoracic;  Laterality: Right;   Social:   Lives With: his fiance, and 64 y/o daughter, in [place] Occupation: trucker Level of Function: independent ADLs and iADLs PCP:  Production Designer, Theatre/television/film, Emilie, DO  Substances: -Tobacco: denies -Alcohol: socially, few drinks on the weekend -Recreational Drug: denies  Family History:  Family History  Problem Relation Age of Onset   Lung cancer Father    Throat cancer Father     Allergies: Allergies as of 01/09/2025   (No Known Allergies)   Review of Systems: A complete ROS was negative except as per HPI.   OBJECTIVE:   Physical Exam: Blood pressure 124/81, pulse 63, temperature 98.6 F (37 C), temperature source Oral, resp. rate 16, height 6' 3 (1.905 m), weight 118.7 kg, SpO2 100%.  Constitutional: well-appearing middle aged man laying down in hospital bed, in no acute distress HENT: normocephalic atraumatic, mucous membranes moist Eyes: conjunctiva non-erythematous Cardiovascular: regular rate and rhythm, no m/r Pulmonary/Chest: normal work of breathing on room air, lungs clear to auscultation bilaterally Abdominal: soft, non-tender, non-distended Neurological: alert & oriented x 3, 5/5 strength in bilateral upper and lower extremities, no focal neurologic deficits Psych: tearful on exam, otherwise pleasant mood and affect  Labs: CBC    Component Value Date/Time   WBC 5.2 01/09/2025 1313   RBC 3.84 (L) 01/09/2025 1313   HGB 12.7 (L) 01/09/2025 1313   HGB 11.7 (L) 08/21/2024 1141   HCT 37.7 (L) 01/09/2025 1313   HCT 35.4 (L) 08/21/2024 1141   PLT 326 01/09/2025 1313   PLT 328 08/21/2024 1141   MCV 98.2 01/09/2025 1313   MCV 103 (H) 08/21/2024 1141   MCH 33.1 01/09/2025 1313   MCHC 33.7 01/09/2025 1313   RDW 12.9 01/09/2025 1313   RDW 13.3 08/21/2024 1141   LYMPHSABS 1.3 08/21/2024 1141   MONOABS 0.8 07/14/2024 0609   EOSABS 0.1 08/21/2024 1141   BASOSABS 0.0 08/21/2024 1141    CMP     Component Value Date/Time   NA 139 01/09/2025 1313   NA 139 01/01/2025 1234   K  4.1 01/09/2025 1313   CL 101 01/09/2025 1313   CO2 26 01/09/2025 1313   GLUCOSE 98 01/09/2025 1313   BUN 26 (H) 01/09/2025 1313   BUN 29 (H) 01/01/2025 1234   CREATININE 1.12 01/09/2025 1313   CALCIUM  9.8 01/09/2025 1313   PROT 6.6 07/12/2024 0925   ALBUMIN 3.4 (L) 07/12/2024 0925   AST 24 07/12/2024 0925   ALT 18 07/12/2024 0925   ALKPHOS 44 07/12/2024 0925   BILITOT 1.2 07/12/2024 0925   GFRNONAA >60 01/09/2025 1313   GFRAA >60 09/05/2018 0418   Imaging: CT HEAD WO CONTRAST ( ) Addendum Date: 01/09/2025 * ADDENDUM #1 * ADDENDUM: Study discussed by telephone with RN Kenneth Goldberg in the office of Dr. Lovie at 1200 hours on January 09, 2025. ---------------------------------------------------- Electronically signed by: Helayne Hurst MD 01/09/2025 12:09 PM EST RP Workstation: [id number]   Result Date: 01/09/2025 * ORIGINAL REPORT * EXAM: CT HEAD WITHOUT CONTRAST 01/09/2025 11:22:13 AM TECHNIQUE: CT of the head was performed without the administration of intravenous contrast. Automated exposure  control, iterative reconstruction, and/or weight based adjustment of the mA/kV was utilized to reduce the radiation dose to as low as reasonably achievable. COMPARISON: Head CT 07/15/2024, Neck MRI 09/11/2024. CLINICAL HISTORY: 54 year old male. Headache, increasing frequency or severity. FINDINGS: BRAIN AND VENTRICLES: No acute hemorrhage. Mixed density but mostly isodense left side subdural hematoma measures up to 10 mm in thickness on coronal images series 4 image 33. Smaller multilateral mixed density but also predominantly isodense right superior convexity subdural hematoma is 5 mm. Intracranial mass effect with downward rightward midline shift of 7 mm. Maintained gray white differentiation. No ventriculomegaly. Basilar cisterns are partially effaced compared to prior studies but patent. No suspicious intracranial vascular hyperdensity. No evidence of acute infarct. No hydrocephalus. ORBITS: No acute  abnormality. SINUSES: Tympanic cavities, paranasal sinuses and mastoids are well aerated. 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 No acute soft tissue abnormality. No skull fracture. 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 IMPRESSION: 1. Positive for mixed density bilateral subdural hematomas: left (10 mm) greater than right (5 mm), with 7 mm rightward midline shift. 2. Mild basilar cistern effacement. No ventriculomegaly. 3. No skull fracture. Electronically signed by: Helayne Hurst MD 01/09/2025 11:46 AM EST RP Workstation: [id number]   ASSESSMENT & PLAN:   Assessment & Plan by Problem: Principal Problem:   Subdural hematoma (HCC) Active Problems:   Uncontrolled hypertension   Hyperlipidemia  Dezmin L Kinser is a 54 y.o. person living with a history of stroke about neurologic deficits and hypertension who presented with headaches for the past month and admitted for bilateral subdural hematomas on hospital day 0.  # Subdural hematoma  Patient with history of a fall 6 weeks ago with a negative CT at that time. Since the fall, he has experienced recurrent headaches, worse at night, and numbness of the right 2nd and 3rd fingers. He saw his PCP one week ago for worsening headaches, and a repeat CT head was obtained, demonstrating bilateral subdural hematomas, left greater than right, with midline shift. On exam, there are no focal neurologic deficits and no signs of increased intracranial pressure. Neurosurgery has been consulted and will evaluate the patient. Patient is NPO after midnight in preparation for surgery tomorrow - NSGY consulted, awaiting recommendations - Neuro checks q2-4h - BP well-controlled; will maintain SBP  110-160 mmHg - Will hold home antihypertensives, if BP elevated continue IV labetalol  - SCDs for DVT prophylaxis  # HTN  Normotensive.  At home, he takes losartan  100 mg daily, spironolactone  12.5 mg, and HCTZ 25 mg, last dose was earlier this morning.   - Hold home antihypertensives, resume after surgery. - IV labetalol  PRN for blood pressure control  Stable medical conditions Asthma: Resumed home Symbicort , albuterol  Q6 PRN Neuropathic pain: Resumed home pregabalin  Hyperlipidemia: resumed Atorvastatin  80 mg daily  Best practice: Diet: NPO VTE: SCDs IVF: None,None Code: Full  Disposition planning: Prior to Admission Living Arrangement: home Anticipated Discharge Location: Home  Dispo: Admit patient to Inpatient with expected length of stay greater than 2 midnights.  Signed: Idelle Nakai, DO Internal Medicine Resident  01/09/2025, 5:48 PM  On Call pager: [phone]      [1]  Current Meds  Medication Sig   acetaminophen  (TYLENOL ) 500 MG tablet Take 2 tablets (1,000 mg  total) by mouth every 6 (six) hours as needed for mild pain or fever.   albuterol  (VENTOLIN  HFA) 108 (90 Base) MCG/ACT inhaler Inhale 2 puffs into the lungs every 6 (six) hours as needed for wheezing or shortness of breath.   aspirin  EC 81 MG tablet Take 1 tablet (81 mg total) by mouth daily. Swallow whole.   atorvastatin  (LIPITOR ) 80 MG tablet Take 1 tablet (80 mg total) by mouth at bedtime.   chlorthalidone  (HYGROTON ) 25 MG tablet Take 1 tablet (25 mg total) by mouth daily.   losartan  (COZAAR ) 100 MG tablet Take 1 tablet (100 mg total) by mouth daily.   pregabalin  (LYRICA ) 150 MG capsule Take 150 mg by mouth 2 (two) times daily as needed (nerve pain).   spironolactone  (ALDACTONE ) 25 MG tablet Take 0.5 tablets (12.5 mg total) by mouth daily.   SYMBICORT  160-4.5 MCG/ACT inhaler Inhale 2 puffs into the lungs in the morning and at bedtime.   "

## 2025-01-09 NOTE — Progress Notes (Signed)
 Got off the phone with patient at 12:15 PM on 01/10/2024. I told him he has bleeding in the brain and to go to the closest emergency department as soon as possible. Patient verbalized understanding and is in agreement.

## 2025-01-09 NOTE — Progress Notes (Signed)
 Pt presented to ED w/ c/o HA and dizziness, gradually increasing following a fall about a month ago. Not currently anticoagulated. Neuro intact per EDP. CTH revealing b/l L>R subacute SDH w/ 7mm MLS. Recommend admission for consideration of intervention, potentially burr holes and/or MMA embolization. NPO midnight.   Tayten Heber CAYLIN Tunisha Ruland, PA-C

## 2025-01-09 NOTE — ED Provider Notes (Signed)
 " [place] EMERGENCY DEPARTMENT AT [facility] Provider Note   CSN: [id number] Arrival date & time: 01/09/25  1250     Patient presents with: Headache and bleeding on brain   Roy [name] is a 54 y.o. male.    Headache Patient presents after a fall.  Reportedly around a month ago had a fall and hit his head.  [place] and had [place] negative head CT at that time.  Reviewing notes it appears that he may have fallen on 1213 and then had a head CT on 12/17.  Negative head CT per report and on chart review.  However is continue to have headaches.  Thought to be secondary to concussion.  Had been seen at internal medicine clinic today and found to have bilateral subdurals.  Somewhat mixed density.  Does have shift to the right of about 7 mm.  Patient states he feels maybe a little dizzy but otherwise not feeling bad.  Still having headaches.  Not on blood thinners.    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 Authorizing Provider  acetaminophen  (TYLENOL ) 500 MG tablet Take 2 tablets (1,000 mg total) by mouth every 6 (six) hours as needed for mild pain or fever. Patient not taking: Reported on 12/28/2024 09/07/18   Pearlean Manus, MD  albuterol  (VENTOLIN  HFA) 108 (90 Base) MCG/ACT inhaler Inhale 2 puffs into the lungs every 6 (six) hours as needed for wheezing or shortness of breath. Patient not taking: Reported on 12/28/2024 03/18/21   Kerrin Elspeth BROCKS, MD  aspirin  EC 81 MG tablet Take 1 tablet (81 mg total) by mouth daily. Swallow whole. 07/17/24   Jolaine Pac, DO  atorvastatin  (LIPITOR ) 80 MG tablet Take 1 tablet (80 mg total) by mouth at bedtime. 07/17/24   Jolaine Pac, DO  chlorthalidone  (HYGROTON ) 25 MG tablet Take  1 tablet (25 mg total) by mouth daily. 12/27/24   Tawkaliyar, Roya, DO  levofloxacin  (LEVAQUIN ) 750 MG tablet Take 1 tablet (750 mg total) by mouth daily. Patient not taking: Reported on 12/28/2024 07/17/24   Jolaine Pac, DO  losartan  (COZAAR ) 100 MG tablet Take 1 tablet (100 mg total) by mouth daily. 12/04/24   Harrie Bruckner, DO  nicotine  (NICODERM CQ  - DOSED IN MG/24 HR) 7 mg/24hr patch PLACE 1 PATCH (7 MG TOTAL) ONTO THE SKIN DAILY. 01/01/25 05/01/25  Tobie Gaines, DO  pregabalin  (LYRICA ) 75 MG capsule Take 75-150 mg by mouth daily as needed (pain). Patient not taking: Reported on 12/28/2024 07/03/24   [provider]  SPIRIVA HANDIHALER 18 MCG inhalation capsule Place 18 mcg into inhaler and inhale daily as needed (wheezing). Patient not taking: Reported on 12/28/2024    [provider]  spironolactone  (ALDACTONE ) 25 MG tablet Take 0.5 tablets (12.5 mg total) by mouth daily. 01/02/25 01/02/26  Charmayne Holmes, DO  SYMBICORT  160-4.5 MCG/ACT inhaler Inhale 2 puffs into the lungs in the morning and at bedtime. Patient not taking: Reported on 12/28/2024    [provider]  [Paused] VIAGRA 100 MG tablet Take 100 mg by mouth as needed for erectile dysfunction. Wait to take this until your doctor or other care provider tells you  to start again.    [provider]    Allergies: Patient has no known allergies.    Review of Systems  Neurological:  Positive for headaches.    Updated Vital Signs BP (!) 146/88   Pulse 84   Temp 98.2 F (36.8 C)   Resp 18   Ht 6' 3 (1.905 m)   Wt 118.7 kg   SpO2 93%   BMI 32.71 kg/m   Physical Exam Vitals and nursing note reviewed.  HENT:     Head: Atraumatic.  Cardiovascular:     Rate and Rhythm: Regular rhythm.  Abdominal:     Palpations: Abdomen is soft.  Musculoskeletal:     Cervical back: Neck supple.  Neurological:     Mental Status: He is alert and oriented to person, place, and time. Mental status is at  baseline.     (all labs ordered are listed, but only abnormal results are displayed) Labs Reviewed  BASIC METABOLIC PANEL WITH GFR - Abnormal; Notable for the following components:      Result Value   BUN 26 (*)    All other components within normal limits  CBC - Abnormal; Notable for the following components:   RBC 3.84 (*)    Hemoglobin 12.7 (*)    HCT 37.7 (*)    All other components within normal limits    EKG: None  Radiology: CT HEAD WO CONTRAST ( ) Addendum Date: 01/09/2025 ** ADDENDUM #1 * ADDENDUM: Study discussed by telephone with RN Kenneth Goldberg in the office of Dr. Lovie at 1200 hours on January 09, 2025. ---------------------------------------------------- Electronically signed by: Helayne Hurst MD 01/09/2025 12:09 PM EST RP Workstation: [id number]   Result Date: 01/09/2025 * ORIGINAL REPORT * EXAM: CT HEAD WITHOUT CONTRAST 01/09/2025 11:22:13 AM TECHNIQUE: CT of the head was performed without the administration of intravenous contrast. Automated exposure control, iterative reconstruction, and/or weight based adjustment of the mA/kV was utilized to reduce the radiation dose to as low as reasonably achievable. COMPARISON: Head CT 07/15/2024, Neck MRI 09/11/2024. CLINICAL HISTORY: 54 year old male. Headache, increasing frequency or severity. FINDINGS: BRAIN AND VENTRICLES: No acute hemorrhage. Mixed density but mostly isodense left side subdural hematoma measures up to 10 mm in thickness on coronal images series 4 image 33. Smaller multilateral mixed density but also predominantly isodense right superior convexity subdural hematoma is 5 mm. Intracranial mass effect with downward rightward midline shift of 7 mm. Maintained gray white differentiation. No ventriculomegaly. Basilar cisterns are partially effaced compared to prior studies but patent. No suspicious intracranial vascular hyperdensity. No evidence of acute infarct. No hydrocephalus. ORBITS: No acute abnormality. SINUSES:  Tympanic cavities, paranasal sinuses and mastoids are well aerated. 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 No acute soft tissue abnormality. No skull fracture. 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 IMPRESSION: 1. Positive for mixed density bilateral subdural hematomas: left (10 mm) greater than right (5 mm), with 7 mm rightward midline shift. 2. Mild basilar cistern effacement. No ventriculomegaly. 3. No skull fracture. Electronically signed by: Helayne Hurst MD 01/09/2025 11:46 AM EST RP Workstation: [id number]     Procedures   Medications Ordered in the ED - No data to display  Medical Decision Making Amount and/or Complexity of Data Reviewed Labs: ordered.  Risk Decision regarding hospitalization.   Patient with fall.  Hit head.  Came in with known subdurals.  Basically normal neurologic exam.  Not on blood thinners.  Did eat around 10:00 this morning.  States he has been a little thirsty.  Will get basic blood work.  Also discussed with neurosurgery who will see patient.  Discussed with surgery.  Will potentially need bur hole or MMA embolization.  No plan for tonight however.  Request admission to primary care.  Primary care is internal medicine residents.   N.p.o. at midnight      Final diagnoses:  Subdural hematoma [facility])    ED Discharge Orders     None          Patsey Lot, MD 01/09/25 1445  "

## 2025-01-09 NOTE — Hospital Course (Addendum)
#  Subdural hematoma  Patient presented with a recent history of worsening headaches after a fall 6 weeks ago. Experienced recurrent headaches worse at night as well as numbness of right 2nd and 3rd fingers. Initial CT scan 6 weeks ago was negative for any acute pathology. Repeat CT scan obtained this week showed bilateral subdural hematomas, left greater than right, with a midline shift of ~65mm. Patient's exam showed no focal neurologic deficits, signs of increased ICP, or worsening of his headache. Neurosurgery was consulted and recommended burr holes for evacuation of his left subdural hematoma.  Procedure was performed on 1/28 without complication, and patient transferred to the PACU.  Patient started on Keppra  for seizure prophylaxis for total of 12 doses, and completed IV antibiotics before discharge. PT and OT came by to evaluate the patient postop, and determined he does not have any acute PT or OT needs at this time.  Patient was reexamined on day of discharge, moving all extremities well, and says his headaches have completely resolved.  Patient says that his pain is very well-controlled with the oral pain medications that he has been receiving, able to bring his pain down to a tolerable level of 2-3/10.  Patient will follow up with neurosurgery in 2 weeks for staple removal.   #HTN  Has a history of hypertension managed with losartan  100 mg daily, spironolactone  12.5 mg daily, and hydrochlorothiazide  25 mg daily. His home blood pressure medications were held on admission and he remained normotensive without acute change during his hospitalization.   Stable medical conditions: #Asthma: Continued home Symbicort , albuterol  Q6 PRN #Neuropathic pain: Continued home pregabalin  #Hyperlipidemia: Continued atorvastatin  80 mg daily

## 2025-01-09 NOTE — ED Triage Notes (Signed)
 Pt. Stated, I have some bleeding on the brain on each side. I had a CT scan due to headaches. They called and said to come here right away.

## 2025-01-10 ENCOUNTER — Inpatient Hospital Stay (HOSPITAL_COMMUNITY): Admitting: Anesthesiology

## 2025-01-10 ENCOUNTER — Encounter (HOSPITAL_COMMUNITY): Admission: EM | Disposition: A | Discharge status: Home / Self Care | Payer: Self-pay | Source: Home / Self Care

## 2025-01-10 DIAGNOSIS — I10 Essential (primary) hypertension: Secondary | ICD-10-CM

## 2025-01-10 DIAGNOSIS — G935 Compression of brain: Secondary | ICD-10-CM | POA: Diagnosis not present

## 2025-01-10 DIAGNOSIS — Z87891 Personal history of nicotine dependence: Secondary | ICD-10-CM | POA: Diagnosis not present

## 2025-01-10 DIAGNOSIS — G44311 Acute post-traumatic headache, intractable: Secondary | ICD-10-CM

## 2025-01-10 DIAGNOSIS — J189 Pneumonia, unspecified organism: Secondary | ICD-10-CM | POA: Diagnosis not present

## 2025-01-10 DIAGNOSIS — W19XXXA Unspecified fall, initial encounter: Secondary | ICD-10-CM | POA: Diagnosis not present

## 2025-01-10 DIAGNOSIS — J45909 Unspecified asthma, uncomplicated: Secondary | ICD-10-CM | POA: Diagnosis not present

## 2025-01-10 DIAGNOSIS — E785 Hyperlipidemia, unspecified: Secondary | ICD-10-CM

## 2025-01-10 DIAGNOSIS — S065XAA Traumatic subdural hemorrhage with loss of consciousness status unknown, initial encounter: Secondary | ICD-10-CM | POA: Diagnosis not present

## 2025-01-10 DIAGNOSIS — D649 Anemia, unspecified: Secondary | ICD-10-CM

## 2025-01-10 DIAGNOSIS — I6200 Nontraumatic subdural hemorrhage, unspecified: Secondary | ICD-10-CM | POA: Diagnosis not present

## 2025-01-10 DIAGNOSIS — R7303 Prediabetes: Secondary | ICD-10-CM | POA: Diagnosis not present

## 2025-01-10 LAB — CBC
HCT: 35.8 % — ABNORMAL LOW (ref 39.0–52.0)
Hemoglobin: 12.3 g/dL — ABNORMAL LOW (ref 13.0–17.0)
MCH: 33.7 pg (ref 26.0–34.0)
MCHC: 34.4 g/dL (ref 30.0–36.0)
MCV: 98.1 fL (ref 80.0–100.0)
Platelets: 316 10*3/uL (ref 150–400)
RBC: 3.65 MIL/uL — ABNORMAL LOW (ref 4.22–5.81)
RDW: 12.8 % (ref 11.5–15.5)
WBC: 6.2 10*3/uL (ref 4.0–10.5)
nRBC: 0 % (ref 0.0–0.2)

## 2025-01-10 LAB — BASIC METABOLIC PANEL WITH GFR
Anion gap: 10 (ref 5–15)
BUN: 30 mg/dL — ABNORMAL HIGH (ref 6–20)
CO2: 28 mmol/L (ref 22–32)
Calcium: 9.3 mg/dL (ref 8.9–10.3)
Chloride: 100 mmol/L (ref 98–111)
Creatinine, Ser: 1.1 mg/dL (ref 0.61–1.24)
GFR, Estimated: >60 mL/min
Glucose, Bld: 85 mg/dL (ref 70–99)
Potassium: 3.9 mmol/L (ref 3.5–5.1)
Sodium: 138 mmol/L (ref 135–145)

## 2025-01-10 LAB — TYPE AND SCREEN
ABO/RH(D): O POS
Antibody Screen: NEGATIVE

## 2025-01-10 LAB — SURGICAL PCR SCREEN
MRSA, PCR: NEGATIVE
Staphylococcus aureus: NEGATIVE

## 2025-01-10 MED ORDER — OXYCODONE-ACETAMINOPHEN 5-325 MG PO TABS
1.0000 | ORAL_TABLET | ORAL | Status: DC | PRN
  Administered 2025-01-11: 1 via ORAL
  Filled 2025-01-10 (×2): qty 1

## 2025-01-10 MED ORDER — LACTATED RINGERS IV SOLN: INTRAVENOUS | Status: DC | PRN

## 2025-01-10 MED ORDER — ROCURONIUM BROMIDE 10 MG/ML (PF) SYRINGE
PREFILLED_SYRINGE | INTRAVENOUS | Status: DC | PRN
  Administered 2025-01-10: 60 mg via INTRAVENOUS
  Administered 2025-01-10: 40 mg via INTRAVENOUS
  Administered 2025-01-10: 20 mg via INTRAVENOUS

## 2025-01-10 MED ORDER — LABETALOL HCL 5 MG/ML IV SOLN: 10.0000 mg | INTRAVENOUS | Status: DC | PRN

## 2025-01-10 MED ORDER — FENTANYL CITRATE (PF) 100 MCG/2ML IJ SOLN
INTRAMUSCULAR | Status: AC
  Filled 2025-01-10: qty 2

## 2025-01-10 MED ORDER — PROPOFOL 10 MG/ML IV BOLUS
INTRAVENOUS | Status: DC | PRN
  Administered 2025-01-10: 150 mg via INTRAVENOUS

## 2025-01-10 MED ORDER — LIDOCAINE-EPINEPHRINE 1 %-1:100000 IJ SOLN
INTRAMUSCULAR | Status: DC | PRN
  Administered 2025-01-10: 4.5 mL

## 2025-01-10 MED ORDER — ACETAMINOPHEN 325 MG PO TABS: 650.0000 mg | ORAL_TABLET | ORAL | Status: DC | PRN

## 2025-01-10 MED ORDER — THROMBIN 5000 UNITS EX SOLR
OROMUCOSAL | Status: DC | PRN
  Administered 2025-01-10: 5 mL via TOPICAL

## 2025-01-10 MED ORDER — EPHEDRINE 5 MG/ML INJ
INTRAVENOUS | Status: AC
  Filled 2025-01-10: qty 5

## 2025-01-10 MED ORDER — LEVETIRACETAM (KEPPRA) 500 MG/5 ML ADULT IV PUSH
500.0000 mg | Freq: Two times a day (BID) | INTRAVENOUS | Status: DC
  Administered 2025-01-10 – 2025-01-11 (×2): 500 mg via INTRAVENOUS
  Filled 2025-01-10 (×2): qty 5

## 2025-01-10 MED ORDER — OXIDIZED CELLULOSE EX PADS
MEDICATED_PAD | CUTANEOUS | Status: DC | PRN
  Administered 2025-01-10: 1 via TOPICAL

## 2025-01-10 MED ORDER — DOCUSATE SODIUM 100 MG PO CAPS
100.0000 mg | ORAL_CAPSULE | Freq: Two times a day (BID) | ORAL | Status: DC
  Administered 2025-01-10 – 2025-01-12 (×4): 100 mg via ORAL
  Filled 2025-01-10 (×4): qty 1

## 2025-01-10 MED ORDER — ONDANSETRON HCL 4 MG/2ML IJ SOLN
INTRAMUSCULAR | Status: DC | PRN
  Administered 2025-01-10: 4 mg via INTRAVENOUS

## 2025-01-10 MED ORDER — PHENYLEPHRINE HCL-NACL 20-0.9 MG/250ML-% IV SOLN
INTRAVENOUS | Status: DC | PRN
  Administered 2025-01-10: 20 ug/min via INTRAVENOUS

## 2025-01-10 MED ORDER — CEFAZOLIN SODIUM 1 G IJ SOLR
INTRAMUSCULAR | Status: AC
  Filled 2025-01-10: qty 20

## 2025-01-10 MED ORDER — FENTANYL CITRATE (PF) 100 MCG/2ML IJ SOLN
INTRAMUSCULAR | Status: DC | PRN
  Administered 2025-01-10: 100 ug via INTRAVENOUS

## 2025-01-10 MED ORDER — SODIUM CHLORIDE 0.9 % IV SOLN: INTRAVENOUS | Status: DC

## 2025-01-10 MED ORDER — ACETAMINOPHEN 650 MG RE SUPP: 650.0000 mg | RECTAL | Status: DC | PRN

## 2025-01-10 MED ORDER — DEXAMETHASONE SOD PHOSPHATE PF 10 MG/ML IJ SOLN
INTRAMUSCULAR | Status: DC | PRN
  Administered 2025-01-10: 10 mg via INTRAVENOUS

## 2025-01-10 MED ORDER — ROCURONIUM BROMIDE 10 MG/ML (PF) SYRINGE
PREFILLED_SYRINGE | INTRAVENOUS | Status: AC
  Filled 2025-01-10: qty 10

## 2025-01-10 MED ORDER — HYDROCODONE-ACETAMINOPHEN 5-325 MG PO TABS
1.0000 | ORAL_TABLET | ORAL | Status: DC | PRN
  Administered 2025-01-10: 1 via ORAL
  Filled 2025-01-10: qty 1

## 2025-01-10 MED ORDER — THROMBIN 5000 UNITS EX KIT
PACK | CUTANEOUS | Status: AC
  Filled 2025-01-10: qty 1

## 2025-01-10 MED ORDER — LIDOCAINE 2% (20 MG/ML) 5 ML SYRINGE
INTRAMUSCULAR | Status: AC
  Filled 2025-01-10: qty 5

## 2025-01-10 MED ORDER — CHLORHEXIDINE GLUCONATE 0.12 % MT SOLN: 15.0000 mL | Freq: Once | OROMUCOSAL | Status: AC

## 2025-01-10 MED ORDER — HYDROXYZINE HCL 25 MG PO TABS
25.0000 mg | ORAL_TABLET | Freq: Every evening | ORAL | Status: DC | PRN
  Administered 2025-01-10 – 2025-01-11 (×3): 25 mg via ORAL
  Filled 2025-01-10 (×3): qty 1

## 2025-01-10 MED ORDER — POLYETHYLENE GLYCOL 3350 17 G PO PACK
17.0000 g | PACK | Freq: Every day | ORAL | Status: DC | PRN
  Administered 2025-01-11: 17 g via ORAL
  Filled 2025-01-10: qty 1

## 2025-01-10 MED ORDER — ONDANSETRON HCL 4 MG PO TABS: 4.0000 mg | ORAL_TABLET | ORAL | Status: DC | PRN

## 2025-01-10 MED ORDER — ACETAMINOPHEN 10 MG/ML IV SOLN
INTRAVENOUS | Status: AC
  Filled 2025-01-10: qty 100

## 2025-01-10 MED ORDER — OXYCODONE HCL 5 MG/5ML PO SOLN: 5.0000 mg | Freq: Once | ORAL | Status: DC | PRN

## 2025-01-10 MED ORDER — 0.9 % SODIUM CHLORIDE (POUR BTL) OPTIME
TOPICAL | Status: DC | PRN
  Administered 2025-01-10: 3000 mL

## 2025-01-10 MED ORDER — LIDOCAINE-EPINEPHRINE 1 %-1:100000 IJ SOLN
INTRAMUSCULAR | Status: AC
  Filled 2025-01-10: qty 1

## 2025-01-10 MED ORDER — ORAL CARE MOUTH RINSE: 15.0000 mL | Freq: Once | OROMUCOSAL | Status: AC

## 2025-01-10 MED ORDER — THROMBIN 20000 UNITS EX SOLR
CUTANEOUS | Status: DC | PRN
  Administered 2025-01-10: 20 mL via TOPICAL

## 2025-01-10 MED ORDER — PANTOPRAZOLE SODIUM 40 MG IV SOLR
40.0000 mg | Freq: Every day | INTRAVENOUS | Status: DC
  Administered 2025-01-10: 40 mg via INTRAVENOUS
  Filled 2025-01-10: qty 10

## 2025-01-10 MED ORDER — CHLORHEXIDINE GLUCONATE CLOTH 2 % EX PADS
6.0000 | MEDICATED_PAD | Freq: Every day | CUTANEOUS | Status: DC
  Administered 2025-01-10 – 2025-01-11 (×2): 6 via TOPICAL

## 2025-01-10 MED ORDER — ONDANSETRON HCL 4 MG/2ML IJ SOLN: 4.0000 mg | INTRAMUSCULAR | Status: DC | PRN

## 2025-01-10 MED ORDER — ORAL CARE MOUTH RINSE: 15.0000 mL | OROMUCOSAL | Status: DC | PRN

## 2025-01-10 MED ORDER — FLEET ENEMA RE ENEM: 1.0000 | ENEMA | Freq: Once | RECTAL | Status: DC | PRN

## 2025-01-10 MED ORDER — CEFAZOLIN SODIUM-DEXTROSE 2-3 GM-%(50ML) IV SOLR
INTRAVENOUS | Status: DC | PRN
  Administered 2025-01-10: 2 g via INTRAVENOUS

## 2025-01-10 MED ORDER — GELATIN ABSORBABLE 100 EX MISC
CUTANEOUS | Status: DC | PRN
  Administered 2025-01-10: 1

## 2025-01-10 MED ORDER — OXYCODONE HCL 5 MG PO TABS: 5.0000 mg | ORAL_TABLET | Freq: Once | ORAL | Status: DC | PRN

## 2025-01-10 MED ORDER — EPHEDRINE SULFATE-NACL 50-0.9 MG/10ML-% IV SOSY
PREFILLED_SYRINGE | INTRAVENOUS | Status: DC | PRN
  Administered 2025-01-10: 10 mg via INTRAVENOUS

## 2025-01-10 MED ORDER — DEXAMETHASONE SOD PHOSPHATE PF 10 MG/ML IJ SOLN
INTRAMUSCULAR | Status: AC
  Filled 2025-01-10: qty 1

## 2025-01-10 MED ORDER — THROMBIN 20000 UNITS EX SOLR
CUTANEOUS | Status: AC
  Filled 2025-01-10: qty 20000

## 2025-01-10 MED ORDER — CHLORHEXIDINE GLUCONATE 0.12 % MT SOLN
OROMUCOSAL | Status: AC
  Administered 2025-01-10: 15 mL via OROMUCOSAL
  Filled 2025-01-10: qty 15

## 2025-01-10 MED ORDER — PROMETHAZINE HCL 25 MG PO TABS: 12.5000 mg | ORAL_TABLET | ORAL | Status: DC | PRN

## 2025-01-10 MED ORDER — SUGAMMADEX SODIUM 200 MG/2ML IV SOLN
INTRAVENOUS | Status: DC | PRN
  Administered 2025-01-10: 300 mg via INTRAVENOUS

## 2025-01-10 MED ORDER — PROPOFOL 10 MG/ML IV BOLUS
INTRAVENOUS | Status: AC
  Filled 2025-01-10: qty 20

## 2025-01-10 MED ORDER — CEFAZOLIN SODIUM-DEXTROSE 2-4 GM/100ML-% IV SOLN
2.0000 g | Freq: Three times a day (TID) | INTRAVENOUS | Status: DC
  Administered 2025-01-10 – 2025-01-12 (×5): 2 g via INTRAVENOUS
  Filled 2025-01-10 (×5): qty 100

## 2025-01-10 MED ORDER — ACETAMINOPHEN 10 MG/ML IV SOLN
1000.0000 mg | Freq: Once | INTRAVENOUS | Status: DC | PRN
  Administered 2025-01-10: 1000 mg via INTRAVENOUS

## 2025-01-10 MED ORDER — LIDOCAINE 2% (20 MG/ML) 5 ML SYRINGE
INTRAMUSCULAR | Status: DC | PRN
  Administered 2025-01-10: 60 mg via INTRAVENOUS

## 2025-01-10 MED ORDER — ONDANSETRON HCL 4 MG/2ML IJ SOLN
INTRAMUSCULAR | Status: AC
  Filled 2025-01-10: qty 2

## 2025-01-10 MED ORDER — AMISULPRIDE (ANTIEMETIC) 5 MG/2ML IV SOLN: 10.0000 mg | Freq: Once | INTRAVENOUS | Status: DC | PRN

## 2025-01-10 MED ORDER — FENTANYL CITRATE (PF) 100 MCG/2ML IJ SOLN
25.0000 ug | INTRAMUSCULAR | Status: DC | PRN
  Administered 2025-01-10: 50 ug via INTRAVENOUS

## 2025-01-10 NOTE — Progress Notes (Signed)
 Went to OR at 1110 accompanied by transporter. Unable to complete pre-procedure checklist as pt is schedule for 1400 but went out of the unit early.

## 2025-01-10 NOTE — Consult Note (Signed)
 "  NAME:  Roy [name], MRN:  [id number], DOB:  [birth year], LOS: 1 ADMISSION DATE:  01/09/2025, CONSULTATION DATE:  01/10/25 REFERRING MD:  Dr. Ethel Ned, CHIEF COMPLAINT:  Subdural Hematoma   History of Present Illness:  [name] is a 54 year old male with a past medical history significant for hypertension, hyperlipidemia, and tobacco use who presented to [facility] on 1/27 with OP CT head findings noting SDH.  Presented to PCP with worsening headache and dizziness for which [facility] was ordered.  CT head noted a large left subacute/chronic SDH with 7mm MLS, noted thin subdural on right side.   Reported fall a month ago.  Admitted for evaluation of craniotomy.    OR 1/28 for evacuation of left subdural hematoma via burr hole with Dr. Ned.   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  No drain in place.  Headache much improved since surgery. No focal deficits.  Dressing to left scalp dry and intact.   Objective    Blood pressure 124/76, pulse (!) 53, temperature 98.1 F (36.7 C), resp. rate 18, height 6' 3 (1.905 m), weight 114.3 kg, SpO2 93%.        Intake/Output Summary (Last 24 hours) at 01/10/2025 1721 Last data filed at 01/10/2025 1506 Gross per 24 hour  Intake 1680 ml  Output 1700 ml  Net -20 ml   Filed Weights   01/09/25 1257 01/10/25 1122  Weight: 118.7 kg 114.3 kg    Examination: General: Adult male sitting upright in bed HENT: bandage to left scalp, d/i  Lungs: clear bilaterally  Cardiovascular:  Regular rate and rhythm without murmur  Abdomen: Rounded, soft to palpation without tenderness  Neuro: Awake, alert following commands.  Equal in all extremities 4+/5.  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 Ned s/p left burr hole for SDH evac - Repeat CTH tomorrow  - Prophylactic AED: Keppra  500mg  q12h - Continue home: Lyrica  150mg  BID PRN  - PRN: Atarax  and Hydrocodone  5-325mg    Cardiac: # Hypertension, Essential  # Hyperlipidemia  -SBP goal <160 - Continue home: Lipitor  80mg , Chlorthalidone  25mg  daily, Cozaar  100mg  daily   - Holding home: ASA, resume pending NSGY recs and Spironolactone  12.5mg  daily   Pulm: # Tobacco Use # Asthma - Recently prescribed Nicotine  patch 7mg , add if needed  - Continue home: Symbicort , Albuterol  PRN   GI - NPO for OR today, resume previous diet as tolerated - Bowel regimen  Endo # Pre Diabetes  - BG goal 100-180; glucose checks with AM labs   Renal/Electrolytes # BPH  - IVF: SL - Repeat BMP in AM  - No home medications   Infectious: - Afebrile, no abx  - Trend WBC/fever curve  Heme: # Anemia, multifactorial  -  Trend hgb w/CBC daily   Best Practice (right click and Reselect all SmartList Selections daily)   Diet/type: Regular consistency (see orders) DVT prophylaxis: SCD; holding chemo dvt ppx with OR today GI prophylaxis: N/A Lines: N/A Foley:  N/A Code Status:  full code   Labs   CBC: Recent Labs  Lab 01/09/25 1313 01/10/25 0105  WBC 5.2 6.2  HGB 12.7* 12.3*  HCT 37.7* 35.8*  MCV 98.2 98.1  PLT 326 316    Basic Metabolic Panel: Recent Labs  Lab 01/09/25 1313 01/10/25 0105  NA 139 138  K 4.1 3.9  CL 101 100  CO2 26 28  GLUCOSE 98 85  BUN 26* 30*  CREATININE 1.12 1.10  CALCIUM  9.8 9.3   GFR: Estimated Creatinine  Clearance: 103.5 mL/min (by C-G formula based on SCr of 1.1 mg/dL). Recent Labs  Lab 01/09/25 1313 01/10/25 0105  WBC 5.2 6.2    Liver Function Tests: No results for input(s): AST, ALT, ALKPHOS, BILITOT, PROT, ALBUMIN in the last 168 hours. No results for input(s): LIPASE, AMYLASE in the last 168 hours. No results for input(s): AMMONIA in the last 168 hours.  ABG    Component Value Date/Time   PHART 7.335 (L) 09/03/2018 0342   PCO2ART 32.1 09/03/2018 0342   PO2ART 180.0 (H) 09/03/2018 0342   HCO3 17.2 (L) 09/03/2018 0342   TCO2 18 (L) 09/03/2018 0342   ACIDBASEDEF 8.0 (H) 09/03/2018 0342   O2SAT 100.0 09/03/2018 0342     Coagulation Profile: No results for input(s): INR, PROTIME in the last 168 hours.  Cardiac Enzymes: No results for input(s): CKTOTAL, CKMB, CKMBINDEX, TROPONINI in the last 168 hours.  HbA1C: Hgb A1c MFr Bld  Date/Time Value Ref Range Status  07/13/2024 05:20 AM 5.8 (H) 4.8 - 5.6 % Final    Comment:    (NOTE) Diagnosis of Diabetes The following HbA1c ranges recommended by the American Diabetes Association (ADA) may be used as an aid in the diagnosis of diabetes mellitus.  Hemoglobin             Suggested A1C NGSP%              Diagnosis  <5.7                   Non Diabetic  5.7-6.4                Pre-Diabetic  >6.4                   Diabetic  <7.0                   Glycemic control for                       adults with diabetes.    08/18/2018 04:01 AM 6.3 (H) 4.8 - 5.6 % Final    Comment:    (NOTE) Pre diabetes:          5.7%-6.4% Diabetes:              >6.4% Glycemic control for   <7.0% adults with diabetes     CBG: No results for input(s): GLUCAP in the last 168 hours.  Review of Systems:   Review of Systems  Constitutional: Negative.   HENT: Negative.    Eyes: Negative.   Respiratory: Negative.    Cardiovascular: Negative.   Gastrointestinal: Negative.   Genitourinary: Negative.    Past  Medical History:  He,  has a past medical history of Aneurysm, Cardiomyopathy (HCC), Empyema of pleural space (HCC) (08/2018), Hypertension, Renal calculi, and Tobacco abuse.   Surgical History:   Past Surgical History:  Procedure Laterality Date   ANEURYSM COILING     a few years ago   DECORTICATION Right 09/02/2018   Procedure: DECORTICATION;  Surgeon: Kerrin Elspeth BROCKS, MD;  Location[facility] OR;  Service: Thoracic;  Laterality: Right;   LOOP RECORDER INSERTION N/A 07/17/2024   Procedure: LOOP RECORDER INSERTION;  Surgeon: Lesia Ozell Barter, PA-C;  Location: [facility] INVASIVE CV LAB;  Service: Cardiovascular;  Laterality: N/A;   PLEURAL EFFUSION DRAINAGE Right 09/02/2018   Procedure: DRAINAGE OF PLEURAL EFFUSION;  Surgeon: Kerrin Elspeth BROCKS, MD;  Location: [facility] OR;  Service: Thoracic;  Laterality: Right;   TRANSESOPHAGEAL ECHOCARDIOGRAM (CATH LAB) N/A 07/17/2024   Procedure: TRANSESOPHAGEAL ECHOCARDIOGRAM;  Surgeon: Lonni Slain, MD;  Location: [facility] INVASIVE CV LAB;  Service: Cardiovascular;  Laterality: N/A;   VIDEO ASSISTED THORACOSCOPY (VATS)/THOROCOTOMY Right 09/02/2018   Procedure: VIDEO ASSISTED THORACOSCOPY (VATS)/possible THOROCOTOMY;  Surgeon: Kerrin Elspeth BROCKS, MD;  Location: [facility] OR;  Service: Thoracic;  Laterality: Right;     Social History:   reports that he has quit smoking. His smoking use included cigarettes. He uses smokeless tobacco. He reports that he does not currently use drugs after having used the following drugs: Marijuana.   Family History:  His family history includes Lung cancer in his father; Throat cancer in his father.   Allergies Allergies[1]   Home Medications  Prior to Admission medications  Medication Sig Start Date End Date Taking? Authorizing Provider  acetaminophen  (TYLENOL ) 500 MG tablet Take 2 tablets (1,000 mg total) by mouth every 6 (six) hours as needed for mild pain or fever. 09/07/18  Yes Emokpae, Courage, MD  albuterol  (VENTOLIN   HFA) 108 (90 Base) MCG/ACT inhaler Inhale 2 puffs into the lungs every 6 (six) hours as needed for wheezing or shortness of breath. 03/18/21  Yes Kerrin Elspeth BROCKS, MD  aspirin  EC 81 MG tablet Take 1 tablet (81 mg total) by mouth daily. Swallow whole. 07/17/24  Yes Jolaine Pac, DO  atorvastatin  (LIPITOR ) 80 MG tablet Take 1 tablet (80 mg total) by mouth at bedtime. 07/17/24  Yes Jolaine Pac, DO  chlorthalidone  (HYGROTON ) 25 MG tablet Take 1 tablet (25 mg total) by mouth daily. 12/27/24  Yes Tawkaliyar, Roya, DO  losartan  (COZAAR ) 100 MG tablet Take 1 tablet (100 mg total) by mouth daily. 12/04/24  Yes Harrie Lonni, DO  pregabalin  (LYRICA ) 150 MG capsule Take 150 mg by mouth 2 (two) times daily as needed (nerve pain).   Yes [provider]  spironolactone  (ALDACTONE ) 25 MG tablet Take 0.5 tablets (12.5 mg total) by mouth daily. 01/02/25 01/02/26 Yes Lyles, Viktoria, DO  SYMBICORT  160-4.5 MCG/ACT inhaler Inhale 2 puffs into the lungs in the morning and at bedtime.   Yes [provider]  methylPREDNISolone  (MEDROL  DOSEPAK) 4 MG TBPK tablet Take 4-24 mg by mouth See admin instructions. Take 24 mg by mouth on day 1, then decrease by 4 mg every day for a total of 6 days Patient not taking: Reported on 01/09/2025 12/25/24   [provider]  Na Sulfate-K Sulfate-Mg Sulfate concentrate (SUPREP) 17.5-3.13-1.6 GM/177ML SOLN Take 1 kit by mouth once. Patient not taking: Reported on 01/09/2025 12/29/24   [provider]  nicotine  (NICODERM CQ  - DOSED IN MG/24 HR) 7 mg/24hr patch PLACE 1 PATCH (7 MG TOTAL) ONTO THE SKIN DAILY. Patient not  taking: Reported on 01/09/2025 01/01/25 05/01/25  Tobie Gaines, DO  [Paused] VIAGRA 100 MG tablet Take 100 mg by mouth as needed for erectile dysfunction. Patient not taking: Reported on 01/09/2025 Wait to take this until your doctor or other care provider tells you to start again.    [provider]     Critical care time: -  This  patient is critically ill due to SDH and Brain compression with numerous comorbidities and at significant risk of hemodynamic worsening, respiratory failure, bleeding, infection, and Cerebral edema, Hematoma expansion, Seizures, and Hemodynamic instability. This patient's care requires constant monitoring of vital signs, hemodynamics, respiratory and cardiac monitoring, review of multiple databases, neurological assessment, discussion with family, other specialists and medical decision making of high complexity.  Total Critical Care time spent: 40 minutes.  This time includes review of data, examination of the patient, formulation of plan.  This time is separate from any billable procedures.             [1] No Known Allergies  "

## 2025-01-10 NOTE — Anesthesia Procedure Notes (Signed)
 Procedure Name: Intubation Date/Time: 01/10/2025 2:02 PM  Performed by: Genny Gun, CRNAPre-anesthesia Checklist: Patient identified, Emergency Drugs available, Suction available, Patient being monitored and Timeout performed Patient Re-evaluated:Patient Re-evaluated prior to induction Oxygen Delivery Method: Circle system utilized Preoxygenation: Pre-oxygenation with 100% oxygen Induction Type: IV induction and Cricoid Pressure applied Ventilation: Mask ventilation without difficulty and Oral airway inserted - appropriate to patient size Laryngoscope Size: 4 Grade View: Grade I Tube type: Oral Tube size: 7.5 mm Number of attempts: 1 Placement Confirmation: ETT inserted through vocal cords under direct vision, positive ETCO2 and breath sounds checked- equal and bilateral Secured at: 23 cm Tube secured with: Tape Dental Injury: Teeth and Oropharynx as per pre-operative assessment

## 2025-01-10 NOTE — Op Note (Signed)
 Procedure(s): [facility] Procedure Note    Procedure(s) and Anesthesia Type:    left sided burr holes and placement of subdural drain for evacuation of subdural hematoma  Surgeon(s) and Role:    DEWAINE Debby Dorn KANDICE, MD - Primary    Camie Pickle - Assisting   Indications: This is a 54 yo M who suffered a head trauma several months ago, developed worsening headaches, dizziness.  He was found to have a large left subacute/chronic subdural hematoma with 7 mm midline shift.  He had a thin subdural on the right side.  Treatment options discussed with the patient.  Risks, benefits, alternatives, and expected convalescence were discussed with patient and wife.  Risks discussed included but were not limited to bleeding, pain, infection, seizure, stroke, scar, subdural recurrence, neurologic deficit, coma, and death.  Informed consent was obtained.  Surgeon: Dorn KANDICE Debby   Assistants: Camie Pickle, PA. No qualified trainees were available to assist with the procedure.  Anesthesia: General endotracheal anesthesia   Procedure in detail:  The patient was brought to the operating room.  A timeout was performed.  General anesthesia was induced and patient was intubated by the anesthesia service.  After appropriate lines and monitors were placed, patient's head was turned and left scalp was clipped, preprepped with alcohol and cleansing solution and prepped and draped in sterile fashion.  1% lidocaine  with epinephrine  injected into the planned incisions.  2 incisions were made with a 10 blade just above the superior temporal line, one in the frontal region and one in the parietal region.  The periosteum was swept off the skull and self-retaining retractors were placed.  Perforator was used to prep performed bur holes at each incision.  The dura was then coagulated and opened in cruciate manner.  Superficial membrane was coagulated and opened at each bur hole and chronic subdural fluid was  encountered.  The subdural space was irrigated thoroughly with good communication between the openings and the irrigation returned clear.  The brain was inspected and appeared to be reexpanding appropriately.  The dural defects were covered with Gelfoam and the subdural space was irrigated one last time.  Cranial plates were secured over the bur holes with screws.   The incisions were closed with 2-0 Vicryl stitches and staples.  The subdural drain was connected to a drainage bag.    Sterile dressing was then placed on the incision.  Patient was then extubated by the anesthesia service moving all extremities.  All counts were correct at the end of surgery.  No complications were noted.  Findings: Large chronic subdural hematoma  Estimated Blood Loss:  < 10 ml         Drains: subdural drain        Specimens: none         Implants:  [employer] cranial plating system        Complications:  none         Disposition: To PACU         Condition: stable

## 2025-01-10 NOTE — Anesthesia Preprocedure Evaluation (Addendum)
"                                    Anesthesia Evaluation  Patient identified by MRN, date of birth, ID band Patient awake    Reviewed: Allergy & Precautions, NPO status , Patient's Chart, lab work & pertinent test results  Airway Mallampati: II  TM Distance: >3 FB Neck ROM: Full    Dental  (+) Missing   Pulmonary pneumonia, resolved, former smoker   Pulmonary exam normal        Cardiovascular hypertension, Pt. on medications Normal cardiovascular exam  ECHO: 1. Left ventricular ejection fraction, by estimation, is 60 to 65%. The  left ventricle has normal function.   2. Right ventricular systolic function is normal. The right ventricular  size is normal.   3. No left atrial/left atrial appendage thrombus was detected. The LAA  emptying velocity was 67 cm/s.   4. A small pericardial effusion is present.   5. The mitral valve is normal in structure. Trivial mitral valve  regurgitation. No evidence of mitral stenosis.   6. The aortic valve is tricuspid. Aortic valve regurgitation is trivial.  No aortic stenosis is present.   7. Agitated saline contrast bubble study was negative, with no evidence  of any interatrial shunt.   8. 3D performed of the mitral valve and demonstrates Trivial bowing of P2  portion of posterior leaflet without frank prolapse.     Neuro/Psych  Headaches CT: 1. Positive for mixed density bilateral subdural hematomas: left (10 mm) greater than right (5 mm), with 7 mm rightward midline shift. 2. Mild basilar cistern effacement. No ventriculomegaly. 3. No skull fracture  CVA  negative psych ROS   GI/Hepatic negative GI ROS, Neg liver ROS,,,  Endo/Other  negative endocrine ROS    Renal/GU negative Renal ROS     Musculoskeletal negative musculoskeletal ROS (+)    Abdominal  (+) + obese  Peds  Hematology  (+) Blood dyscrasia, anemia   Anesthesia Other Findings   Reproductive/Obstetrics                               Anesthesia Physical Anesthesia Plan  ASA: 3  Anesthesia Plan: General   Post-op Pain Management:    Induction: Intravenous  PONV Risk Score and Plan: 2 and Ondansetron , Dexamethasone  and Treatment may vary due to age or medical condition  Airway Management Planned: Oral ETT  Additional Equipment: Arterial line  Intra-op Plan:   Post-operative Plan: Extubation in OR  Informed Consent: I have reviewed the patients History and Physical, chart, labs and discussed the procedure including the risks, benefits and alternatives for the proposed anesthesia with the patient or authorized representative who has indicated his/her understanding and acceptance.     Dental advisory given  Plan Discussed with: CRNA  Anesthesia Plan Comments: (Potential arterial line placement discussed )         Anesthesia Quick Evaluation  "

## 2025-01-10 NOTE — Plan of Care (Signed)
  Problem: Education: Goal: Knowledge of General Education information will improve Description: Including pain rating scale, medication(s)/side effects and non-pharmacologic comfort measures Outcome: Progressing   Problem: Clinical Measurements: Goal: Ability to maintain clinical measurements within normal limits will improve Outcome: Progressing   Problem: Coping: Goal: Level of anxiety will decrease Outcome: Progressing   Problem: Pain Managment: Goal: General experience of comfort will improve and/or be controlled Outcome: Progressing   Problem: Safety: Goal: Ability to remain free from injury will improve Outcome: Progressing

## 2025-01-10 NOTE — Progress Notes (Signed)
 Pt belongings was handed to significant other mentioned in patient chart.

## 2025-01-10 NOTE — Transfer of Care (Signed)
 Immediate Anesthesia Transfer of Care Note  Patient: Roy [name]  Procedure(s) Performed: LEFT BURR HOLE (Left) CREATION, CRANIAL BURR HOLE (Left)  Patient Location: PACU  Anesthesia Type:General  Level of Consciousness: awake, alert , oriented, and patient cooperative  Airway & Oxygen Therapy: Patient Spontanous Breathing  Post-op Assessment: Report given to RN and Post -op Vital signs reviewed and stable  Post vital signs: Reviewed and stable  Last Vitals:  Vitals Value Taken Time  BP 137/77 01/10/25 15:30  Temp    Pulse 64 01/10/25 15:32  Resp 11 01/10/25 15:32  SpO2 99 % 01/10/25 15:32  Vitals shown include unfiled device data.  Last Pain:  Vitals:   01/10/25 1152  TempSrc:   PainSc: 3       Patients Stated Pain Goal: 0 (01/10/25 1152)  Complications: No notable events documented.

## 2025-01-10 NOTE — Progress Notes (Signed)
 "  HD#1 SUBJECTIVE:  Patient Summary: Roy [name] is a 54 y.o. with a pertinent PMH of stroke without neurologic deficits and hypertension who presented with headaches for the past month and admitted for bilateral subdural hematomas.  Overnight Events: Patient received one-time dose of Atarax  for anxiety/sleep.  Interim History: Patient was seen this morning on rounds sleeping in hospital bed, he initially seemed confused because he did not recognize the attending physician who ordered his CT, the patient says he does not have his glasses, just woke up and had a poor night of sleep and with the frequent neurochecks.  Patient says that he had no new or worsening or return of his headache last night, and has no new concerns.  Discussed with patient that neurosurgery is planning for procedure today around 2 PM, and they will be by to discuss the risks and benefits before the procedure.  Patient expressed frustration that neurosurgery did not come see him yesterday, though reassured patient that they will come by before his planned procedure.  Patient continues to perseverate on his frustration with lack of communication.  OBJECTIVE:  Vital Signs: Vitals:   01/09/25 1744 01/09/25 2003 01/09/25 2350 01/10/25 0411  BP: 124/81 130/70 126/82 116/65  Pulse: 63 65 (!) 50 (!) 45  Resp: 16 18 17 18   Temp: 98.6 F (37 C) (!) 97.5 F (36.4 C) 97.8 F (36.6 C) 97.9 F (36.6 C)  TempSrc: Oral Oral Axillary Oral  SpO2: 100% 96% 99% 96%  Weight:      Height:       Supplemental O2: Room Air SpO2: 96 %  Filed Weights   01/09/25 1257  Weight: 118.7 kg    Intake/Output Summary (Last 24 hours) at 01/10/2025 0636 Last data filed at 01/10/2025 0600 Gross per 24 hour  Intake 680 ml  Output 1000 ml  Net -320 ml   Net IO Since Admission: -320 mL [01/10/25 0636]  Physical Exam: Constitutional: well-appearing middle aged man laying down in hospital bed, in no acute distress HENT: normocephalic  atraumatic, mucous membranes moist Eyes: conjunctiva non-erythematous Cardiovascular: Bradycardic rate and regular rhythm, no m/r Pulmonary/Chest: normal work of breathing on room air, lungs clear to auscultation bilaterally Abdominal: soft, non-tender, normal bowel sounds Neurological: alert & oriented x 3, no focal neurologic deficits  Patient Lines/Drains/Airways Status     Active Line/Drains/Airways     Name Placement date Placement time Site Days   Peripheral IV 01/09/25 20 G Anterior;Left;Upper Arm 01/09/25  2013  Arm  1            Pertinent labs and imaging:     Latest Ref Rng & Units 01/10/2025    1:05 AM 01/09/2025    1:13 PM 08/21/2024   11:41 AM  CBC  WBC 4.0 - 10.5 K/uL 6.2  5.2  5.7   Hemoglobin 13.0 - 17.0 g/dL 87.6  87.2  88.2   Hematocrit 39.0 - 52.0 % 35.8  37.7  35.4   Platelets 150 - 400 K/uL 316  326  328        Latest Ref Rng & Units 01/10/2025    1:05 AM 01/09/2025    1:13 PM 01/01/2025   12:34 PM  CMP  Glucose 70 - 99 mg/dL 85  98  897   BUN 6 - 20 mg/dL 30  26  29    Creatinine 0.61 - 1.24 mg/dL 8.89  8.87  8.84   Sodium 135 - 145 mmol/L 138  139  139   Potassium 3.5 - 5.1 mmol/L 3.9  4.1  4.2   Chloride 98 - 111 mmol/L 100  101  102   CO2 22 - 32 mmol/L 28  26  22    Calcium  8.9 - 10.3 mg/dL 9.3  9.8  9.9     CT HEAD WO CONTRAST ( ) Addendum Date: 01/09/2025 * ADDENDUM #1 * ADDENDUM: Study discussed by telephone with RN Kenneth Goldberg in the office of Dr. Lovie at 1200 hours on January 09, 2025. ---------------------------------------------------- Electronically signed by: Helayne Hurst MD 01/09/2025 12:09 PM EST RP Workstation: [id number]   Result Date: 01/09/2025 * ORIGINAL REPORT * EXAM: CT HEAD WITHOUT CONTRAST 01/09/2025 11:22:13 AM TECHNIQUE: CT of the head was performed without the administration of intravenous contrast. Automated exposure control, iterative reconstruction, and/or weight based adjustment of the mA/kV was utilized to reduce  the radiation dose to as low as reasonably achievable. COMPARISON: Head CT 07/15/2024, Neck MRI 09/11/2024. CLINICAL HISTORY: 54 year old male. Headache, increasing frequency or severity. FINDINGS: BRAIN AND VENTRICLES: No acute hemorrhage. Mixed density but mostly isodense left side subdural hematoma measures up to 10 mm in thickness on coronal images series 4 image 33. Smaller multilateral mixed density but also predominantly isodense right superior convexity subdural hematoma is 5 mm. Intracranial mass effect with downward rightward midline shift of 7 mm. Maintained gray white differentiation. No ventriculomegaly. Basilar cisterns are partially effaced compared to prior studies but patent. No suspicious intracranial vascular hyperdensity. No evidence of acute infarct. No hydrocephalus. ORBITS: No acute abnormality. SINUSES: Tympanic cavities, paranasal sinuses and mastoids are well aerated. 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 No acute soft tissue abnormality. No skull fracture. 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 IMPRESSION: 1. Positive for mixed density bilateral subdural hematomas: left (10 mm) greater than right (5 mm), with 7 mm rightward midline shift. 2. Mild basilar cistern effacement. No ventriculomegaly. 3. No skull fracture. Electronically signed by: Helayne Hurst MD 01/09/2025 11:46 AM EST RP Workstation: [id number]    ASSESSMENT/PLAN:  Assessment: Principal Problem:   Subdural hematoma (HCC) Active Problems:   Uncontrolled hypertension   Hyperlipidemia   Neuropathic pain  Roy [name] is a 54 y.o. person living with a history of stroke without neurologic deficits and hypertension who presented with  headaches for the past month and admitted for bilateral subdural hematomas on hospital day 1.  Plan: # Subdural hematoma  Patient has unchanged nonfocal neurologic exam and without signs of increased ICP, no new return or worsening of his headache. Neurosurgery has been consulted and planning for burr holes and/or MMA embolization procedure today. - NSGY consulted, burr holes and/or MMA embolization today - continue neuro checks q4h - BP well-controlled; will maintain SBP 110-160 mmHg - Will hold home antihypertensives, if BP elevated continue IV labetalol  - SCDs for DVT prophylaxis - PT/OT eval tomorrow   # HTN  Currently normotensive 110s/60s.  At home, he takes losartan  100 mg daily, spironolactone  12.5 mg, and HCTZ 25 mg, last dose was the morning of his admission.   - Holding home antihypertensives, resume after surgery as needed - IV labetalol  PRN for blood pressure control   Stable medical conditions Asthma: Continue home Symbicort , albuterol  Q6 PRN Neuropathic pain: Continue home pregabalin  Hyperlipidemia:  Continue atorvastatin  80 mg daily  Best Practice: Diet: NPO IVF: Fluids: none VTE: SCDs Start: 01/09/25 1539 Code: Full  Disposition planning: Therapy Recs: Pending, DME: other pending eval Family Contact: fiance, called and notified. DISPO: Anticipated discharge in 1-3 days to Home pending PT/OT recommendations after procedure.  Signature: Alfornia Light, DO [facility]  6:36 AM, 01/10/2025  On Call pager [phone]  "

## 2025-01-10 NOTE — Consult Note (Signed)
 HPI:     Pt is a 54yo male who presented to ED w/ c/o HA and dizziness, gradually increasing following a fall about a month ago. Not currently anticoagulated. Denies visual change, AMS, N/T to BUE/BLE, falls, gait change. PMH of CVA, HTN.     Patient Active Problem List   Diagnosis Date Noted   Subdural hematoma (HCC) 01/09/2025   Neuropathic pain 01/09/2025   Tension headache 12/27/2024   BPH (benign prostatic hyperplasia) 09/18/2024   Swelling, mass, or lump on face 08/21/2024   Prediabetes 07/24/2024   Hyperlipidemia 07/24/2024   CVA (cerebral vascular accident) (HCC) 07/24/2024   Tendonitis, Achilles, left 07/24/2024   Constipation 07/24/2024   History of cerebral aneurysm repair 07/13/2024   Nephrolithiasis 07/13/2024   UTI (urinary tract infection) 07/13/2024   AKI (acute kidney injury) 07/13/2024   Sepsis (HCC) 09/02/2018   Pleural effusion on right 09/01/2018   Multifocal pneumonia 09/01/2018   Uncontrolled hypertension 09/01/2018   Tobacco abuse 09/01/2018   Abnormal LFTs 09/01/2018   Empyema lung (HCC) 09/01/2018   Past Medical History:  Diagnosis Date   Aneurysm    Cardiomyopathy (HCC)    pt sts he no longer has it   Empyema of pleural space (HCC) 08/2018   Hypertension    Renal calculi    Tobacco abuse     Past Surgical History:  Procedure Laterality Date   ANEURYSM COILING     a few years ago   DECORTICATION Right 09/02/2018   Procedure: DECORTICATION;  Surgeon: Kerrin Elspeth BROCKS, MD;  Location: [facility] OR;  Service: Thoracic;  Laterality: Right;   LOOP RECORDER INSERTION N/A 07/17/2024   Procedure: LOOP RECORDER INSERTION;  Surgeon: Lesia Ozell Barter, PA-C;  Location: [facility] INVASIVE CV LAB;  Service: Cardiovascular;  Laterality: N/A;   PLEURAL EFFUSION DRAINAGE Right 09/02/2018   Procedure: DRAINAGE OF PLEURAL EFFUSION;  Surgeon: Kerrin Elspeth BROCKS, MD;  Location: [facility] OR;  Service: Thoracic;  Laterality: Right;   TRANSESOPHAGEAL ECHOCARDIOGRAM (CATH  LAB) N/A 07/17/2024   Procedure: TRANSESOPHAGEAL ECHOCARDIOGRAM;  Surgeon: Lonni Slain, MD;  Location: [facility] INVASIVE CV LAB;  Service: Cardiovascular;  Laterality: N/A;   VIDEO ASSISTED THORACOSCOPY (VATS)/THOROCOTOMY Right 09/02/2018   Procedure: VIDEO ASSISTED THORACOSCOPY (VATS)/possible THOROCOTOMY;  Surgeon: Kerrin Elspeth BROCKS, MD;  Location: [facility] OR;  Service: Thoracic;  Laterality: Right;    Medications Prior to Admission  Medication Sig Dispense Refill Last Dose/Taking   acetaminophen  (TYLENOL ) 500 MG tablet Take 2 tablets (1,000 mg total) by mouth every 6 (six) hours as needed for mild pain or fever. 30 tablet 0 Past Week   albuterol  (VENTOLIN  HFA) 108 (90 Base) MCG/ACT inhaler Inhale 2 puffs into the lungs every 6 (six) hours as needed for wheezing or shortness of breath. 8 g 2 Past Week   aspirin  EC 81 MG tablet Take 1 tablet (81 mg total) by mouth daily. Swallow whole. 30 tablet 1 01/09/2025   atorvastatin  (LIPITOR ) 80 MG tablet Take 1 tablet (80 mg total) by mouth at bedtime. 30 tablet 1 Past Week   chlorthalidone  (HYGROTON ) 25 MG tablet Take 1 tablet (25 mg total) by mouth daily. 90 tablet 2 01/09/2025   losartan  (COZAAR ) 100 MG tablet Take 1 tablet (100 mg total) by mouth daily. 90 tablet 3 01/09/2025   pregabalin  (LYRICA ) 150 MG capsule Take 150 mg by mouth 2 (two) times daily as needed (nerve pain).   01/08/2025   spironolactone  (ALDACTONE ) 25 MG tablet Take 0.5 tablets (12.5 mg total) by mouth daily.  90 tablet 3 01/09/2025   SYMBICORT  160-4.5 MCG/ACT inhaler Inhale 2 puffs into the lungs in the morning and at bedtime.   01/09/2025   methylPREDNISolone  (MEDROL  DOSEPAK) 4 MG TBPK tablet Take 4-24 mg by mouth See admin instructions. Take 24 mg by mouth on day 1, then decrease by 4 mg every day for a total of 6 days (Patient not taking: Reported on 01/09/2025)   Not Taking   Na Sulfate-K Sulfate-Mg Sulfate concentrate (SUPREP) 17.5-3.13-1.6 GM/177ML SOLN Take 1 kit by mouth once.  (Patient not taking: Reported on 01/09/2025)   Not Taking   nicotine  (NICODERM CQ  - DOSED IN MG/24 HR) 7 mg/24hr patch PLACE 1 PATCH (7 MG TOTAL) ONTO THE SKIN DAILY. (Patient not taking: Reported on 01/09/2025) 28 patch 3 Not Taking   [Paused] VIAGRA 100 MG tablet Take 100 mg by mouth as needed for erectile dysfunction. (Patient not taking: Reported on 01/09/2025)   Not Taking   Allergies[1]  Social History   Tobacco Use   Smoking status: Former    Types: Cigarettes   Smokeless tobacco: Current   Tobacco comments:    Vape  Substance Use Topics   Alcohol use: Not on file    Family History  Problem Relation Age of Onset   Lung cancer Father    Throat cancer Father      Objective:   Patient Vitals for the past 8 hrs:  BP Temp Temp src Pulse Resp SpO2  01/10/25 0749 101/61 98.4 F (36.9 C) Oral (!) 45 16 98 %  01/10/25 0411 116/65 97.9 F (36.6 C) Oral (!) 45 18 96 %   I/O last 3 completed shifts: In: 680 [P.O.:680] Out: 1000 [Urine:1000] No intake/output data recorded.  CT HEAD WO CONTRAST ( ) Addendum Date: 01/09/2025 ADDENDUM #1 ADDENDUM: Study discussed by telephone with RN Kenneth Goldberg in the office of Dr. Lovie at 1200 hours on January 09, 2025. ---------------------------------------------------- Electronically signed by: Helayne Hurst MD 01/09/2025 12:09 PM EST RP Workstation: [id number]   Result Date: 01/09/2025 ORIGINAL REPORT EXAM: CT HEAD WITHOUT CONTRAST 01/09/2025 11:22:13 AM TECHNIQUE: CT of the head was performed without the administration of intravenous contrast. Automated exposure control, iterative reconstruction, and/or weight based adjustment of the mA/kV was utilized to reduce the radiation dose to as low as reasonably achievable. COMPARISON: Head CT 07/15/2024, Neck MRI 09/11/2024. CLINICAL HISTORY: 54 year old male. Headache, increasing frequency or severity. FINDINGS: BRAIN AND VENTRICLES: No acute hemorrhage. Mixed density but mostly isodense left side  subdural hematoma measures up to 10 mm in thickness on coronal images series 4 image 33. Smaller multilateral mixed density but also predominantly isodense right superior convexity subdural hematoma is 5 mm. Intracranial mass effect with downward rightward midline shift of 7 mm. Maintained gray white differentiation. No ventriculomegaly. Basilar cisterns are partially effaced compared to prior studies but patent. No suspicious intracranial vascular hyperdensity. No evidence of acute infarct. No hydrocephalus. ORBITS: No acute abnormality. SINUSES: Tympanic cavities, paranasal sinuses and mastoids are well aerated. 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 No acute soft tissue abnormality. No skull fracture. 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 IMPRESSION: 1. Positive for mixed density bilateral subdural hematomas: left (10 mm) greater than right (5 mm), with 7 mm rightward midline shift.  2. Mild basilar cistern effacement. No ventriculomegaly. 3. No skull fracture. Electronically signed by: Helayne Hurst MD 01/09/2025 11:46 AM EST RP Workstation: [id number]   Awake, alert, oriented Speech fluent, appropriate CN grossly intact 5/5 BUE/BLE    Assessment:   This is a 54yo male with b/l L>R subacute SDH w/ 7mm MLS.    Plan:   -Plan for OR today for burr holes for evacuation of L SDH.   Roy Hoose CAYLIN Deaisha Welborn, PA-C       [1] No Known Allergies

## 2025-01-10 NOTE — Progress Notes (Signed)
 Pt seen and examined. Complains of headache. Positive right prone to drift.  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 Risk, alternatives, and expect adolescence were discussed. Risks discussed included, but were not limited to bleeding, pain, infection, scar, seizure, stroke, neurologic deficit, recurrence, coma, and death.

## 2025-01-11 ENCOUNTER — Inpatient Hospital Stay (HOSPITAL_COMMUNITY)

## 2025-01-11 ENCOUNTER — Telehealth (INDEPENDENT_AMBULATORY_CARE_PROVIDER_SITE_OTHER): Payer: Self-pay

## 2025-01-11 ENCOUNTER — Encounter (INDEPENDENT_AMBULATORY_CARE_PROVIDER_SITE_OTHER): Payer: Self-pay

## 2025-01-11 DIAGNOSIS — S065XAA Traumatic subdural hemorrhage with loss of consciousness status unknown, initial encounter: Secondary | ICD-10-CM | POA: Diagnosis not present

## 2025-01-11 DIAGNOSIS — W19XXXA Unspecified fall, initial encounter: Secondary | ICD-10-CM | POA: Diagnosis not present

## 2025-01-11 DIAGNOSIS — I10 Essential (primary) hypertension: Secondary | ICD-10-CM | POA: Diagnosis not present

## 2025-01-11 DIAGNOSIS — G935 Compression of brain: Secondary | ICD-10-CM | POA: Diagnosis not present

## 2025-01-11 DIAGNOSIS — Z48811 Encounter for surgical aftercare following surgery on the nervous system: Secondary | ICD-10-CM

## 2025-01-11 DIAGNOSIS — E785 Hyperlipidemia, unspecified: Secondary | ICD-10-CM | POA: Diagnosis not present

## 2025-01-11 DIAGNOSIS — R7303 Prediabetes: Secondary | ICD-10-CM | POA: Diagnosis not present

## 2025-01-11 DIAGNOSIS — G44311 Acute post-traumatic headache, intractable: Secondary | ICD-10-CM | POA: Diagnosis not present

## 2025-01-11 LAB — CBC
HCT: 38.9 % — ABNORMAL LOW (ref 39.0–52.0)
Hemoglobin: 13.1 g/dL (ref 13.0–17.0)
MCH: 33.2 pg (ref 26.0–34.0)
MCHC: 33.7 g/dL (ref 30.0–36.0)
MCV: 98.5 fL (ref 80.0–100.0)
Platelets: 331 10*3/uL (ref 150–400)
RBC: 3.95 MIL/uL — ABNORMAL LOW (ref 4.22–5.81)
RDW: 12.6 % (ref 11.5–15.5)
WBC: 10.1 10*3/uL (ref 4.0–10.5)
nRBC: 0 % (ref 0.0–0.2)

## 2025-01-11 LAB — BASIC METABOLIC PANEL WITH GFR
Anion gap: 10 (ref 5–15)
BUN: 25 mg/dL — ABNORMAL HIGH (ref 6–20)
CO2: 25 mmol/L (ref 22–32)
Calcium: 9.3 mg/dL (ref 8.9–10.3)
Chloride: 100 mmol/L (ref 98–111)
Creatinine, Ser: 1.2 mg/dL (ref 0.61–1.24)
GFR, Estimated: >60 mL/min
Glucose, Bld: 148 mg/dL — ABNORMAL HIGH (ref 70–99)
Potassium: 4.6 mmol/L (ref 3.5–5.1)
Sodium: 135 mmol/L (ref 135–145)

## 2025-01-11 LAB — TROPONIN T, HIGH SENSITIVITY
Troponin T High Sensitivity: 13 ng/L (ref 0–19)
Troponin T High Sensitivity: 12 ng/L (ref 0–19)

## 2025-01-11 MED ORDER — NICOTINE 7 MG/24HR TD PT24: 7.0000 mg | MEDICATED_PATCH | Freq: Every day | TRANSDERMAL | Status: DC | PRN

## 2025-01-11 MED ORDER — OXYCODONE HCL 5 MG PO TABS
5.0000 mg | ORAL_TABLET | ORAL | Status: DC | PRN
  Administered 2025-01-11 (×3): 10 mg via ORAL
  Administered 2025-01-12 (×2): 5 mg via ORAL
  Filled 2025-01-11 (×3): qty 2
  Filled 2025-01-11 (×2): qty 1

## 2025-01-11 MED ORDER — HYDRALAZINE HCL 20 MG/ML IJ SOLN: 10.0000 mg | INTRAMUSCULAR | Status: DC | PRN

## 2025-01-11 MED ORDER — LEVETIRACETAM 500 MG PO TABS
500.0000 mg | ORAL_TABLET | Freq: Two times a day (BID) | ORAL | Status: DC
  Administered 2025-01-11 – 2025-01-12 (×2): 500 mg via ORAL
  Filled 2025-01-11 (×2): qty 1

## 2025-01-11 MED ORDER — PREGABALIN 75 MG PO CAPS
150.0000 mg | ORAL_CAPSULE | Freq: Two times a day (BID) | ORAL | Status: DC
  Administered 2025-01-11 – 2025-01-12 (×3): 150 mg via ORAL
  Filled 2025-01-11 (×3): qty 2

## 2025-01-11 MED ORDER — TRAMADOL HCL 50 MG PO TABS
50.0000 mg | ORAL_TABLET | Freq: Four times a day (QID) | ORAL | Status: DC | PRN
  Administered 2025-01-11: 50 mg via ORAL
  Filled 2025-01-11: qty 1

## 2025-01-11 MED ORDER — HYDROMORPHONE HCL 1 MG/ML IJ SOLN: 1.0000 mg | Freq: Four times a day (QID) | INTRAMUSCULAR | Status: DC | PRN

## 2025-01-11 NOTE — Telephone Encounter (Signed)
 Lvm to rs provider out of office

## 2025-01-11 NOTE — Evaluation (Signed)
 Physical Therapy Evaluation Patient Details Name: Roy [name] MRN: [id number] DOB: 09-14-1971 Today's Date: 01/11/2025  History of Present Illness  Pt is a 54 y/o M who presented to [facility] for persistent headaches following fall ~1 month ago. CTH revealed L>R subacute SDH. Now s/p L side burr holes for hematoma evacuation with NSG on 01/10/25. PMHx: HTN, HLD, CVA, BPH, tobacco use.  Clinical Impression  Pt is close to baseline functioning and should be safe at home with PRN assist from significant other. There are no further acute PT needs.  Will sign off at this time.         If plan is discharge home, recommend the following:  (PRN supervision/assist)   Can travel by private vehicle        Equipment Recommendations None recommended by PT  Recommendations for Other Services       Functional Status Assessment Patient has had a recent decline in their functional status and demonstrates the ability to make significant improvements in function in a reasonable and predictable amount of time.     Precautions / Restrictions Precautions Precautions: Fall Precaution/Restrictions Comments: SBP <160 Restrictions Weight Bearing Restrictions Per Provider Order: No      Mobility  Bed Mobility Overal bed mobility: Independent                  Transfers Overall transfer level: Needs assistance   Transfers: Sit to/from Stand             General transfer comment: no hands needed    Ambulation/Gait Ambulation/Gait assistance: Independent Gait Distance (Feet): 600 Feet Assistive device: None Gait Pattern/deviations: WFL(Within Functional Limits)   Gait velocity interpretation: >4.37 ft/sec, indicative of normal walking speed      Stairs Stairs: Yes Stairs assistance: Independent Stair Management: No rails Number of Stairs: 12 General stair comments: smooth and safe without UE assist  Wheelchair Mobility     Tilt Bed    Modified Rankin (Stroke Patients  Only)       Balance Overall balance assessment: Mild deficits observed, not formally tested                               Standardized Balance Assessment Standardized Balance Assessment : Dynamic Gait Index   Dynamic Gait Index Level Surface: Normal Change in Gait Speed: Normal Gait with Horizontal Head Turns: Normal Gait with Vertical Head Turns: Normal Gait and Pivot Turn: Normal Step Over Obstacle: Normal Step Around Obstacles: Normal Steps: Normal Total Score: 24       Pertinent Vitals/Pain Pain Assessment Pain Assessment: No/denies pain Pain Intervention(s): Premedicated before session    Home Living Family/patient expects to be discharged to:: Private residence Living Arrangements: Spouse/significant other (whatever assist needed)   Type of Home: House Home Access: Stairs to enter Entrance Stairs-Rails: None Entrance Stairs-Number of Steps: 2 STE Alternate Level Stairs-Number of Steps: FF Home Layout: Two level;Bed/bath upstairs Home Equipment: Agricultural Consultant (2 wheels);Cane - single point;Grab bars - tub/shower      Prior Function Prior Level of Function : Independent/Modified Independent;Working/employed;Driving;History of Falls (last six months) (x1 fall ~1 month ago)             Mobility Comments: no AD ADLs Comments: indep     Extremity/Trunk Assessment        Lower Extremity Assessment Lower Extremity Assessment: Overall WFL for tasks assessed    Cervical / Trunk Assessment Cervical / Trunk  Assessment: Normal  Communication   Communication Communication: No apparent difficulties    Cognition Arousal: Alert Behavior During Therapy: WFL for tasks assessed/performed                             Following commands: Intact       Cueing Cueing Techniques: Verbal cues     General Comments      Exercises     Assessment/Plan    PT Assessment Patient does not need any further PT services  PT Problem List          PT Treatment Interventions      PT Goals (Current goals can be found in the Care Plan section)  Acute Rehab PT Goals Patient Stated Goal: Independent, back driving truck when appropriate PT Goal Formulation: All assessment and education complete, DC therapy    Frequency       Co-evaluation               AM-PAC PT 6 Clicks Mobility  Outcome Measure Help needed turning from your back to your side while in a flat bed without using bedrails?: None Help needed moving from lying on your back to sitting on the side of a flat bed without using bedrails?: None Help needed moving to and from a bed to a chair (including a wheelchair)?: None Help needed standing up from a chair using your arms (e.g., wheelchair or bedside chair)?: None Help needed to walk in hospital room?: None Help needed climbing 3-5 steps with a railing? : None 6 Click Score: 24    End of Session   Activity Tolerance: Patient tolerated treatment well Patient left: in bed;with family/visitor present Nurse Communication: Mobility status PT Visit Diagnosis: Other symptoms and signs involving the nervous system (R29.898)    Time: 8367-8292 PT Time Calculation (min) (ACUTE ONLY): 35 min   Charges:   PT Evaluation $PT Eval Low Complexity: 1 Low PT Treatments $Gait Training: 8-22 mins PT General Charges $$ ACUTE PT VISIT: 1 Visit         01/11/2025  [place] HERO., PT Acute Rehabilitation Services [phone]  (office)  Vinie GAILS Conley Delisle 01/11/2025, 5:20 PM

## 2025-01-11 NOTE — Progress Notes (Signed)
 "  NAME:  Roy [name], MRN:  [id number], DOB:  1971/03/21, LOS: 2 ADMISSION DATE:  01/09/2025, CONSULTATION DATE:  01/10/25 REFERRING MD:  Dr. Ethel Ned, CHIEF COMPLAINT:  Subdural Hematoma   History of Present Illness:  Roy [name] is a 54 year old male with a past medical history significant for hypertension, hyperlipidemia, and tobacco use who presented to [facility] on 1/27 with OP CT head findings noting SDH.  Presented to PCP with worsening headache and dizziness for which [facility] was ordered.  CT head noted a large left subacute/chronic SDH with 7mm MLS, noted thin subdural on right side.   Reported fall a month ago.  Admitted for evaluation of craniotomy.    OR 1/28 for evacuation of left subdural hematoma via burr hole with Dr. Ned.   Pertinent  Medical History   Past Medical History:  Diagnosis Date   Aneurysm    Cardiomyopathy (HCC)    pt sts he no longer has it   Empyema of pleural space (HCC) 08/2018   Hypertension    Renal calculi    Tobacco abuse    Significant Hospital Events: Including procedures, antibiotic start and stop dates in addition to other pertinent events   1/27: Admit  1/28: OR for left burr hole, SDH evac 1/29 incr pain, txf to progressive   Interim History / Subjective:  Interval CT H with decr sz SDH and midline shift + surgical changes   HDS   Poorly controlled pain overnight  Objective    Blood pressure (!) 110/59, pulse (!) 56, temperature 97.6 F (36.4 C), temperature source Oral, resp. rate 12, height 6' 3 (1.905 m), weight 114.3 kg, SpO2 95%.        Intake/Output Summary (Last 24 hours) at 01/11/2025 1005 Last data filed at 01/11/2025 0800 Gross per 24 hour  Intake 2030 ml  Output 2800 ml  Net -770 ml   Filed Weights   01/09/25 1257 01/10/25 1122  Weight: 118.7 kg 114.3 kg    Examination: General: WDWN middle aged M NAD  HENT: L scalp surgical sites are dressed, no drain.  Neuro: AAOx4 5/5 BUE BLE. CV:  rrr cap refill is brisk Pulm: even unlabored on RA  Abd soft round  GU: defer   Assessment and Plan    AoC bilat SDH with brain compression s/p L burr hole x 2 (1/28 w Dr ned) HA Pain P -post op per NSGY -cont ppx keppra   -complete surgical ppx abx  -stable clinically for txf to progressive -incr pain reg-- PRN APAP, PRN oxy 5-10, PRN ultram . Can escalate further if needed  -restart home lyrica  -NSGY to eval for possible MMA embo need   -holding ASA, chem vte ppx until nsgy oks. SCDs for now -PT/OT    HTN HLD P -defer home antihypertensives for now -- PRN hydral. -add back home anithypertensives as indicated -cont home lipitor   -home ASA on home   Tobacco use Hx asthma without exacerbation  P -PRN nicotine  patch  -breo, PRN albuterol   Prediabetes  -CBG on BMP within goal, no present indication for q4 POC checks      Stable for txf to progressive. Will ask IMTS to take over care 1/30   Best Practice (right click and Reselect all SmartList Selections daily)   Diet/type: Regular consistency (see orders) DVT prophylaxis: SCD; holding chemo dvt ppx with OR today GI prophylaxis: N/A Lines: N/A Foley:  N/A Code Status:  full code   Labs  CBC: Recent Labs  Lab 01/09/25 1313 01/10/25 0105 01/11/25 0509  WBC 5.2 6.2 10.1  HGB 12.7* 12.3* 13.1  HCT 37.7* 35.8* 38.9*  MCV 98.2 98.1 98.5  PLT 326 316 331    Basic Metabolic Panel: Recent Labs  Lab 01/09/25 1313 01/10/25 0105 01/11/25 0509  NA 139 138 135  K 4.1 3.9 4.6  CL 101 100 100  CO2 26 28 25   GLUCOSE 98 85 148*  BUN 26* 30* 25*  CREATININE 1.12 1.10 1.20  CALCIUM  9.8 9.3 9.3   GFR: Estimated Creatinine Clearance: 94.8 mL/min (by C-G formula based on SCr of 1.2 mg/dL). Recent Labs  Lab 01/09/25 1313 01/10/25 0105 01/11/25 0509  WBC 5.2 6.2 10.1    Liver Function Tests: No results for input(s): AST, ALT, ALKPHOS, BILITOT, PROT, ALBUMIN in the last 168 hours. No  results for input(s): LIPASE, AMYLASE in the last 168 hours. No results for input(s): AMMONIA in the last 168 hours.  ABG    Component Value Date/Time   PHART 7.335 (L) 09/03/2018 0342   PCO2ART 32.1 09/03/2018 0342   PO2ART 180.0 (H) 09/03/2018 0342   HCO3 17.2 (L) 09/03/2018 0342   TCO2 18 (L) 09/03/2018 0342   ACIDBASEDEF 8.0 (H) 09/03/2018 0342   O2SAT 100.0 09/03/2018 0342     Coagulation Profile: No results for input(s): INR, PROTIME in the last 168 hours.  Cardiac Enzymes: No results for input(s): CKTOTAL, CKMB, CKMBINDEX, TROPONINI in the last 168 hours.  HbA1C: Hgb A1c MFr Bld  Date/Time Value Ref Range Status  07/13/2024 05:20 AM 5.8 (H) 4.8 - 5.6 % Final    Comment:    (NOTE) Diagnosis of Diabetes The following HbA1c ranges recommended by the American Diabetes Association (ADA) may be used as an aid in the diagnosis of diabetes mellitus.  Hemoglobin             Suggested A1C NGSP%              Diagnosis  <5.7                   Non Diabetic  5.7-6.4                Pre-Diabetic  >6.4                   Diabetic  <7.0                   Glycemic control for                       adults with diabetes.    08/18/2018 04:01 AM 6.3 (H) 4.8 - 5.6 % Final    Comment:    (NOTE) Pre diabetes:          5.7%-6.4% Diabetes:              >6.4% Glycemic control for   <7.0% adults with diabetes     CBG: No results for input(s): GLUCAP in the last 168 hours.    Mod MDM   Ronnald Gave MSN, AGACNP-BC [facility] Amion for pager  01/11/2025, 10:05 AM      "

## 2025-01-11 NOTE — Progress Notes (Signed)
 eLink Physician-Brief Progress Note Patient Name: Roy [name] DOB: 1971/09/10 MRN: [id number]   Date of Service  01/11/2025  HPI/Events of Note  RN reports pt c/o headache despite receiving norco 5/325. Unable to give the pain relievers ordered as they ar all acetaminophen  based and RN is getting flagged.  eICU Interventions  Ordered Tramadol  50 mg Po for breakthrough pain Discussed with BSRN     Intervention Category Intermediate Interventions: Pain - evaluation and management  Damien ONEIDA Grout 01/11/2025, 12:59 AM

## 2025-01-11 NOTE — Anesthesia Postprocedure Evaluation (Signed)
"   Anesthesia Post Note  Patient: Noble L Dubey  Procedure(s) Performed: LEFT BURR HOLE (Left) CREATION, CRANIAL BURR HOLE (Left)     Patient location during evaluation: PACU Anesthesia Type: General Level of consciousness: awake Pain management: pain level controlled Vital Signs Assessment: post-procedure vital signs reviewed and stable Respiratory status: spontaneous breathing, nonlabored ventilation and respiratory function stable Cardiovascular status: blood pressure returned to baseline and stable Postop Assessment: no apparent nausea or vomiting Anesthetic complications: no   No notable events documented.  Last Vitals:  Vitals:   01/11/25 0600 01/11/25 0700  BP: 118/78 124/89  Pulse: (!) 51 63  Resp: 15 (!) 21  Temp:    SpO2: 95% 95%    Last Pain:  Vitals:   01/11/25 0600  TempSrc:   PainSc: 7                  Jaquez Farrington P Mel Tadros      "

## 2025-01-11 NOTE — Evaluation (Addendum)
 Occupational Therapy Evaluation Patient Details Name: Roy [name] MRN: [id number] DOB: 11-15-1971 Today's Date: 01/11/2025   History of Present Illness   Pt is a 54 y/o M who presented to [facility] for persistent headaches following fall ~1 month ago. CTH revealed L>R subacute SDH. Now s/p L side burr holes for hematoma evacuation with NSG on 01/10/25. PMHx: HTN, HLD, CVA, BPH, tobacco use.     Clinical Impressions Pt seen for OT evaluation this AM, agreeable for visit. Supportive fiance at bedside. AOX4. PTA, pt was working full time driving a truck, independent with BADL, IADLs, mobility. Has good support from his fiance at d/c. He presents today with intact strength and sensation in BUE. Denies visual changes. Functionally, he was no more than supervision for all transfers/mobility without AD. Mod I for completion of UB/LB ADLs. Dual tasks well without rest breaks/breaks in attention. He scored 0/28 on Short Blessed Test, indicating no cognitive impairment. VSS, SBP 120s. Pt is functioning at a level that is adequate for d/c from an OT standpoint, no follow-up OT needs at this time. Acute OT to sign-off.     If plan is discharge home, recommend the following:   Assistance with cooking/housework;Assist for transportation     Functional Status Assessment         Equipment Recommendations   None recommended by OT     Recommendations for Other Services         Precautions/Restrictions   Precautions Precautions: Fall Precaution/Restrictions Comments: SBP <160 Restrictions Weight Bearing Restrictions Per Provider Order: No     Mobility Bed Mobility Overal bed mobility: Needs Assistance Bed Mobility: Supine to Sit, Sit to Supine     Supine to sit: Supervision Sit to supine: Supervision   General bed mobility comments: exited to the L, HOB mostly flat    Transfers Overall transfer level: Needs assistance   Transfers: Sit to/from Stand Sit to Stand:  Supervision           General transfer comment: stood from bed with good form; no difficulty standing from toilet height without use of grab bars 3x      Balance Overall balance assessment: Mild deficits observed, not formally tested                                         ADL either performed or assessed with clinical judgement   ADL Overall ADL's : Modified independent                                       General ADL Comments: pt completed LB dressing, grooming, and simulated toileting tasks mod I without difficulty     Vision Baseline Vision/History: 1 Wears glasses (Rx glasses) Ability to See in Adequate Light: 0 Adequate Patient Visual Report: No change from baseline Vision Assessment?: No apparent visual deficits     Perception         Praxis         Pertinent Vitals/Pain Pain Assessment Pain Assessment: 0-10 Pain Score: 5  Pain Location: HA Pain Descriptors / Indicators: Headache Pain Intervention(s): Repositioned, Monitored during session     Extremity/Trunk Assessment Upper Extremity Assessment Upper Extremity Assessment: Overall WFL for tasks assessed   Lower Extremity Assessment Lower Extremity Assessment: Defer to PT evaluation   Cervical /  Trunk Assessment Cervical / Trunk Assessment: Normal   Communication Communication Communication: No apparent difficulties   Cognition Arousal: Alert Behavior During Therapy: WFL for tasks assessed/performed Cognition: No apparent impairments             OT - Cognition Comments: pt scored 0/28 on Short Blessed Test, indicating no cognitive impairment                 Following commands: Intact       Cueing  General Comments   Cueing Techniques: Verbal cues  supportive fiance present; SBP 120s, HR 70s   Exercises     Shoulder Instructions      Home Living Family/patient expects to be discharged to:: Private residence Living Arrangements:  Spouse/significant other   Type of Home: House Home Access: Stairs to enter [employer] of Steps: 2 STE Entrance Stairs-Rails: None Home Layout: Two level;Bed/bath upstairs Alternate Level Stairs-Number of Steps: FF Alternate Level Stairs-Rails:  (unilateral HR) Bathroom Shower/Tub: Chief Strategy Officer: Standard     Home Equipment: Agricultural Consultant (2 wheels);Cane - single point;Grab bars - tub/shower          Prior Functioning/Environment Prior Level of Function : Independent/Modified Independent;Working/employed;Driving;History of Falls (last six months) (x1 fall ~1 month ago)             Mobility Comments: no AD ADLs Comments: indep    OT Problem List: Impaired balance (sitting and/or standing);Pain   OT Treatment/Interventions:        OT Goals(Current goals can be found in the care plan section)   Acute Rehab OT Goals Patient Stated Goal: go home and take a shower   OT Frequency:       Co-evaluation              AM-PAC OT 6 Clicks Daily Activity     Outcome Measure Help from another person eating meals?: None Help from another person taking care of personal grooming?: None Help from another person toileting, which includes using toliet, bedpan, or urinal?: None Help from another person bathing (including washing, rinsing, drying)?: A Little Help from another person to put on and taking off regular upper body clothing?: None Help from another person to put on and taking off regular lower body clothing?: None 6 Click Score: 23   End of Session Nurse Communication: Mobility status  Activity Tolerance: Patient tolerated treatment well Patient left: in bed;with call bell/phone within reach;with family/visitor present  OT Visit Diagnosis: Other abnormalities of gait and mobility (R26.89);History of falling (Z91.81)                Time: 9044-8982 OT Time Calculation (min): 22 min Charges:  OT General Charges $OT Visit: 1  Visit OT Evaluation $OT Eval Low Complexity: 1 Low  Lindee Leason M. [place], OTR/L [facility] Acute Rehabilitation Services [phone] Secure Chat Preferred  Roy [name] 01/11/2025, 12:45 PM

## 2025-01-11 NOTE — Progress Notes (Signed)
" °  °  Providing Compassionate, Quality Care - Together   NEUROSURGERY PROGRESS NOTE     S: Incisional pain overnight.    O: EXAM:  BP (!) 110/59 (BP Location: Right Arm)   Pulse (!) 56   Temp 97.6 F (36.4 C) (Oral)   Resp 12   Ht 6' 3 (1.905 m)   Wt 114.3 kg   SpO2 95%   BMI 31.50 kg/m     Awake, alert, oriented  Speech fluent, appropriate  CNs grossly intact  BUE/BLE 5/5 SILTx4 Dressing c/d/i   ASSESSMENT:  54 y.o. s/p L burr holes for evacuation of SDH, POD#1. Post op CTH with marked improvement.     PLAN: -Continue supportive care -Appropriate for dc from NSGY standpoint with outpt f/u in 2 weeks for staple removal.  -Call w/ questions/concerns.   Camie Pickle, PAC  "

## 2025-01-12 ENCOUNTER — Encounter (HOSPITAL_COMMUNITY): Payer: Self-pay | Admitting: Neurosurgery

## 2025-01-12 ENCOUNTER — Other Ambulatory Visit (HOSPITAL_COMMUNITY): Payer: Self-pay

## 2025-01-12 DIAGNOSIS — I10 Essential (primary) hypertension: Secondary | ICD-10-CM | POA: Diagnosis not present

## 2025-01-12 DIAGNOSIS — E785 Hyperlipidemia, unspecified: Secondary | ICD-10-CM | POA: Diagnosis not present

## 2025-01-12 DIAGNOSIS — I6200 Nontraumatic subdural hemorrhage, unspecified: Secondary | ICD-10-CM | POA: Diagnosis not present

## 2025-01-12 DIAGNOSIS — J45909 Unspecified asthma, uncomplicated: Secondary | ICD-10-CM | POA: Diagnosis not present

## 2025-01-12 LAB — BASIC METABOLIC PANEL WITH GFR
Anion gap: 10 (ref 5–15)
BUN: 24 mg/dL — ABNORMAL HIGH (ref 6–20)
CO2: 28 mmol/L (ref 22–32)
Calcium: 9.3 mg/dL (ref 8.9–10.3)
Chloride: 101 mmol/L (ref 98–111)
Creatinine, Ser: 1.15 mg/dL (ref 0.61–1.24)
GFR, Estimated: >60 mL/min
Glucose, Bld: 143 mg/dL — ABNORMAL HIGH (ref 70–99)
Potassium: 4.1 mmol/L (ref 3.5–5.1)
Sodium: 139 mmol/L (ref 135–145)

## 2025-01-12 LAB — CBC
HCT: 36.9 % — ABNORMAL LOW (ref 39.0–52.0)
Hemoglobin: 12.4 g/dL — ABNORMAL LOW (ref 13.0–17.0)
MCH: 33.2 pg (ref 26.0–34.0)
MCHC: 33.6 g/dL (ref 30.0–36.0)
MCV: 98.9 fL (ref 80.0–100.0)
Platelets: 292 10*3/uL (ref 150–400)
RBC: 3.73 MIL/uL — ABNORMAL LOW (ref 4.22–5.81)
RDW: 12.8 % (ref 11.5–15.5)
WBC: 7.9 10*3/uL (ref 4.0–10.5)
nRBC: 0 % (ref 0.0–0.2)

## 2025-01-12 LAB — GLUCOSE, CAPILLARY: Glucose-Capillary: 89 mg/dL (ref 70–99)

## 2025-01-12 MED ORDER — OXYCODONE HCL 10 MG PO TABS
10.0000 mg | ORAL_TABLET | Freq: Three times a day (TID) | ORAL | 0 refills | Status: AC | PRN
  Filled 2025-01-12: qty 9, 3d supply, fill #0

## 2025-01-12 MED ORDER — OXYCODONE HCL 5 MG PO TABS
5.0000 mg | ORAL_TABLET | Freq: Four times a day (QID) | ORAL | 0 refills | Status: DC | PRN
  Filled 2025-01-12: qty 20, 5d supply, fill #0

## 2025-01-12 MED ORDER — LEVETIRACETAM 500 MG PO TABS
500.0000 mg | ORAL_TABLET | Freq: Two times a day (BID) | ORAL | 0 refills | Status: AC
  Filled 2025-01-12: qty 10, 5d supply, fill #0

## 2025-01-12 NOTE — Discharge Summary (Cosign Needed Addendum)
 "  Name: Roy [name] MRN: [id number] DOB: 04-27-1971 54 y.o. PCP: Benuel Braun, DO  Date of Admission: 01/09/2025 12:53 PM Date of Discharge: 01/12/2025 Attending Physician: Dr. MICAEL Riis Winfrey  Discharge Diagnosis: 1. Principal Problem:   Subdural hematoma (HCC) Active Problems:   Uncontrolled hypertension   Hyperlipidemia   Neuropathic pain  Discharge Medications: Allergies as of 01/12/2025   No Known Allergies      Medication List     PAUSE taking these medications    Aspirin  Adult Low Strength 81 MG tablet Wait to take this until your doctor or other care provider tells you to start again. Generic drug: aspirin  EC Take 1 tablet (81 mg total) by mouth daily. Swallow whole.   chlorthalidone  25 MG tablet Wait to take this until your doctor or other care provider tells you to start again. Commonly known as: HYGROTON  Take 1 tablet (25 mg total) by mouth daily.   losartan  100 MG tablet Wait to take this until your doctor or other care provider tells you to start again. Commonly known as: COZAAR  Take 1 tablet (100 mg total) by mouth daily.   spironolactone  25 MG tablet Wait to take this until your doctor or other care provider tells you to start again. Commonly known as: Aldactone  Take 0.5 tablets (12.5 mg total) by mouth daily.       STOP taking these medications    methylPREDNISolone  4 MG Tbpk tablet Commonly known as: MEDROL  DOSEPAK   Na Sulfate-K Sulfate-Mg Sulfate concentrate 17.5-3.13-1.6 GM/177ML Soln Commonly known as: SUPREP   nicotine  7 mg/24hr patch Commonly known as: NICODERM CQ  - dosed in mg/24 hr   Viagra 100 MG tablet Generic drug: sildenafil       TAKE these medications    acetaminophen  500 MG tablet Commonly known as: TYLENOL  Take 2 tablets (1,000 mg total) by mouth every 6 (six) hours as needed for mild pain or fever.   albuterol  108 (90 Base) MCG/ACT inhaler Commonly known as: VENTOLIN  HFA Inhale 2 puffs into the  lungs every 6 (six) hours as needed for wheezing or shortness of breath.   atorvastatin  80 MG tablet Commonly known as: LIPITOR  Take 1 tablet (80 mg total) by mouth at bedtime.   levETIRAcetam  500 MG tablet Commonly known as: KEPPRA  Take 1 tablet (500 mg total) by mouth every 12 (twelve) hours.   oxyCODONE  5 MG immediate release tablet Commonly known as: Oxy IR/ROXICODONE  Take 1 tablet (5 mg total) by mouth every 6 (six) hours as needed for up to 5 days for severe pain (pain score 7-10) or breakthrough pain.   pregabalin  150 MG capsule Commonly known as: LYRICA  Take 150 mg by mouth 2 (two) times daily as needed (nerve pain).   Symbicort  160-4.5 MCG/ACT inhaler Generic drug: budesonide -formoterol  Inhale 2 puffs into the lungs in the morning and at bedtime.       Disposition and follow-up:   Roy [name] was discharged from [facility] in Good condition.  At the hospital follow up visit please address:  1.  HTN regimen -> paused all antihypertensives given normal Bps throughout admission. Consider adding back as needed  Thank you for allowing us  to be part of your care. You were hospitalized for bilateral subdural hematomas. We treated you with a craniotomy; bur holes for subdural hematoma evacuation and pain control.  See the changes in your medications and management of your chronic conditions below:  *For your subdural hematoma -We have STARTED you on  these following medications:  - Keppra  500 mg every 12 hours, 10 doses remaining  - oxycodone  10 mg every 8 hours PRN -We have STOPPED the following medications until you follow with Neurosurgery  - Aspirin  81mg  daily  *For your hypertension -We have STOPPED the following medications:  - Losartan  100 mg daily  - Spironolactone  12.5 mg daily  - Hydrochlorothiazide  25 mg daily  FOLLOW UP APPOINTMENTS: We arranged for you to follow up with your family doctor, Norrine Sharper, MD, on 01/17/2025 at  10:15 am  Please follow up with your neurosurgeon at [facility] in 2 weeks for staple removal   Please call your PCP or our clinic if you have any questions or concerns, we may be able to help and keep you from a long and expensive emergency room wait. Our clinic and after hours phone number is [phone]. The best time to call is Monday through Friday 9 am to 4 pm but there is always someone available 24/7 if you have an emergency. If you need medication refills please notify your pharmacy one week in advance and they will send us  a request.   We are glad you are feeling better,  Alfornia Light, DO Internal Medicine Inpatient Teaching Service at [facility]   Follow-up Appointments:  Follow-up Information     Norrine Sharper, MD Follow up on 01/17/2025.   Specialty: Internal Medicine Why: 10:15 am Contact information: [address] [place] [address] [phone]         [facility] Follow up in 2 week(s).   Specialty: Neurosurgery Why: follow-up with your neurosurgeon in two weeks for scalp suture removal Contact information: [address] [place] [address] [phone]                Hospital Course by problem list: Roy [name] is a 54 y.o. person living with a history of stroke without neurologic deficits and hypertension who presented with headaches for the past month and admitted for bilateral subdural hematomas, now being discharged on hospital day 3 with the following pertinent hospital course:  #Subdural hematoma  Patient presented with a recent history of worsening headaches after a fall 6 weeks ago. Experienced recurrent headaches worse at night as well as numbness of right 2nd and 3rd fingers. Initial CT scan 6 weeks ago was negative for any acute pathology. Repeat CT scan obtained this week showed bilateral subdural hematomas, left greater than right, with a  midline shift of ~90mm. Patient's exam showed no focal neurologic deficits, signs of increased ICP, or worsening of his headache. Neurosurgery was consulted and recommended burr holes for evacuation of his left subdural hematoma.  Procedure was performed on 1/28 without complication, and patient transferred to the PACU.  Patient started on Keppra  for seizure prophylaxis for total of 12 doses, and completed IV antibiotics before discharge. PT and OT came by to evaluate the patient postop, and determined he does not have any acute PT or OT needs at this time.  Patient was reexamined on day of discharge, moving all extremities well, and says his headaches have completely resolved.  Patient says that his pain is very well-controlled with the oral pain medications that he has been receiving, able to bring his pain down to a tolerable level of 2-3/10.  Patient will follow up with neurosurgery in 2 weeks for staple removal.   #HTN  Has a history of hypertension managed with losartan   100 mg daily, spironolactone  12.5 mg daily, and hydrochlorothiazide  25 mg daily. His home blood pressure medications were held on admission and he remained normotensive without acute change during his hospitalization.   Stable medical conditions: #Asthma: Continued home Symbicort , albuterol  Q6 PRN #Neuropathic pain: Continued home pregabalin  #Hyperlipidemia: Continued atorvastatin  80 mg daily   Subjective Patient was seen and examined this morning with fiance at bedside. Patient says he is incredible grateful for the procedure he has had. Patient says his pain is usually an 8/10, though the pain medication he has been giving will bring it down to 5/10, or even 2-3/10.  Patient says that this pain is very tolerable and has been well-controlled since surgery.  Patient says that his headaches have now completely resolved and he has no new concerns or complaints.  Patient voices understanding to go to his follow-up appointment with PCP  and to follow-up with neurosurgery for staple removal.  Discussed with patient that we will be pausing his antihypertensive medications given his normotension throughout this hospitalization, he voiced understanding. Patient also understands he will stop taking aspirin  until he follows-up with his neurosurgeon in 2 weeks.  Discharge Exam:   BP (!) 104/52   Pulse (!) 57   Temp 98 F (36.7 C) (Oral)   Resp 13   Ht 6' 3 (1.905 m)   Wt 114.3 kg   SpO2 95%   BMI 31.50 kg/m  Discharge exam:  Constitutional: well-appearing middle aged man laying down in hospital bed with fiance at bedside, in no acute distress Cardiovascular: Bradycardic rate and regular rhythm, no m/r Pulmonary/Chest: normal work of breathing on room air, lungs clear to auscultation bilaterally Abdominal: soft, non-tender, normal bowel sounds Neurological: alert & oriented x 3, moving all extremities well  Pertinent Labs, Studies, and Procedures:     Latest Ref Rng & Units 01/12/2025    8:23 AM 01/11/2025    5:09 AM 01/10/2025    1:05 AM  CBC  WBC 4.0 - 10.5 K/uL 7.9  10.1  6.2   Hemoglobin 13.0 - 17.0 g/dL 87.5  86.8  87.6   Hematocrit 39.0 - 52.0 % 36.9  38.9  35.8   Platelets 150 - 400 K/uL 292  331  316        Latest Ref Rng & Units 01/12/2025    8:23 AM 01/11/2025    5:09 AM 01/10/2025    1:05 AM  CMP  Glucose 70 - 99 mg/dL 856  851  85   BUN 6 - 20 mg/dL 24  25  30    Creatinine 0.61 - 1.24 mg/dL 8.84  8.79  8.89   Sodium 135 - 145 mmol/L 139  135  138   Potassium 3.5 - 5.1 mmol/L 4.1  4.6  3.9   Chloride 98 - 111 mmol/L 101  100  100   CO2 22 - 32 mmol/L 28  25  28    Calcium  8.9 - 10.3 mg/dL 9.3  9.3  9.3     CT HEAD WO CONTRAST ( ) Addendum Date: 01/09/2025 * ADDENDUM #1 * ADDENDUM: Study discussed by telephone with RN Kenneth Goldberg in the office of Dr. Lovie at 1200 hours on January 09, 2025. ---------------------------------------------------- Electronically signed by: Helayne Hurst MD 01/09/2025  12:09 PM EST RP Workstation: [id number]   Result Date: 01/09/2025 * ORIGINAL REPORT * EXAM: CT HEAD WITHOUT CONTRAST 01/09/2025 11:22:13 AM TECHNIQUE: CT of the head was performed without the administration of intravenous contrast. Automated exposure control, iterative reconstruction, and/or  weight based adjustment of the mA/kV was utilized to reduce the radiation dose to as low as reasonably achievable. COMPARISON: Head CT 07/15/2024, Neck MRI 09/11/2024. CLINICAL HISTORY: 54 year old male. Headache, increasing frequency or severity. FINDINGS: BRAIN AND VENTRICLES: No acute hemorrhage. Mixed density but mostly isodense left side subdural hematoma measures up to 10 mm in thickness on coronal images series 4 image 33. Smaller multilateral mixed density but also predominantly isodense right superior convexity subdural hematoma is 5 mm. Intracranial mass effect with downward rightward midline shift of 7 mm. Maintained gray white differentiation. No ventriculomegaly. Basilar cisterns are partially effaced compared to prior studies but patent. No suspicious intracranial vascular hyperdensity. No evidence of acute infarct. No hydrocephalus. ORBITS: No acute abnormality. SINUSES: Tympanic cavities, paranasal sinuses and mastoids are well aerated. 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 No acute soft tissue abnormality. No skull fracture. 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 IMPRESSION: 1. Positive for mixed density bilateral subdural hematomas: left (10 mm) greater than right (5 mm), with 7 mm rightward midline shift. 2. Mild basilar cistern effacement. No ventriculomegaly. 3. No skull fracture. Electronically signed by: Helayne Hurst MD  01/09/2025 11:46 AM EST RP Workstation: [id number]   Signed: Idelle Nakai, DO 01/12/2025, 12:51 PM   "

## 2025-01-12 NOTE — Progress Notes (Signed)
" °  °  Providing Compassionate, Quality Care - Together   NEUROSURGERY PROGRESS NOTE     S: No issues overnight. Pain improved. Ready to go home.    O: EXAM:  BP (!) 104/52   Pulse (!) 57   Temp 98 F (36.7 C) (Oral)   Resp 13   Ht 6' 3 (1.905 m)   Wt 114.3 kg   SpO2 95%   BMI 31.50 kg/m     Awake, alert, oriented  Speech fluent, appropriate  CNs grossly intact  MAEs Dressing c/d/i   ASSESSMENT:  54 y.o. with s/p L burr holes for evacuation of SDH, POD#2.    PLAN: -Appropriate for dc from NSGY standpoint with outpt f/u in 2 weeks with staple removal.    Camie Pickle, PAC  "

## 2025-01-15 ENCOUNTER — Ambulatory Visit: Payer: Self-pay | Admitting: Student

## 2025-01-16 ENCOUNTER — Telehealth: Payer: Self-pay | Admitting: *Deleted

## 2025-01-17 ENCOUNTER — Ambulatory Visit: Admitting: Student

## 2025-01-20 ENCOUNTER — Ambulatory Visit: Payer: Self-pay

## 2025-01-22 ENCOUNTER — Ambulatory Visit: Admit: 2025-01-22 | Payer: Self-pay

## 2025-01-22 ENCOUNTER — Ambulatory Visit (INDEPENDENT_AMBULATORY_CARE_PROVIDER_SITE_OTHER)

## 2025-02-20 ENCOUNTER — Ambulatory Visit: Payer: Self-pay

## 2025-03-23 ENCOUNTER — Ambulatory Visit: Payer: Self-pay

## 2025-04-23 ENCOUNTER — Ambulatory Visit: Payer: Self-pay

## 2025-08-27 ENCOUNTER — Ambulatory Visit: Payer: Self-pay

## 2025-09-20 ENCOUNTER — Ambulatory Visit: Admitting: Urology
